# Patient Record
Sex: Female | Born: 1945
Health system: Southern US, Community
[De-identification: ages and names within clinical notes are randomized; demographics above are authoritative.]

## PROBLEM LIST (undated history)

## (undated) DIAGNOSIS — G43909 Migraine, unspecified, not intractable, without status migrainosus: Secondary | ICD-10-CM

## (undated) DIAGNOSIS — C73 Malignant neoplasm of thyroid gland: Secondary | ICD-10-CM

## (undated) DIAGNOSIS — J189 Pneumonia, unspecified organism: Secondary | ICD-10-CM

## (undated) DIAGNOSIS — T4145XA Adverse effect of unspecified anesthetic, initial encounter: Secondary | ICD-10-CM

## (undated) DIAGNOSIS — N6019 Diffuse cystic mastopathy of unspecified breast: Secondary | ICD-10-CM

## (undated) DIAGNOSIS — I10 Essential (primary) hypertension: Secondary | ICD-10-CM

## (undated) DIAGNOSIS — E039 Hypothyroidism, unspecified: Secondary | ICD-10-CM

## (undated) DIAGNOSIS — F419 Anxiety disorder, unspecified: Secondary | ICD-10-CM

## (undated) DIAGNOSIS — I739 Peripheral vascular disease, unspecified: Secondary | ICD-10-CM

## (undated) DIAGNOSIS — C50919 Malignant neoplasm of unspecified site of unspecified female breast: Secondary | ICD-10-CM

## (undated) DIAGNOSIS — K219 Gastro-esophageal reflux disease without esophagitis: Secondary | ICD-10-CM

## (undated) DIAGNOSIS — T8859XA Other complications of anesthesia, initial encounter: Secondary | ICD-10-CM

## (undated) DIAGNOSIS — C801 Malignant (primary) neoplasm, unspecified: Secondary | ICD-10-CM

## (undated) DIAGNOSIS — Z87440 Personal history of urinary (tract) infections: Secondary | ICD-10-CM

## (undated) DIAGNOSIS — E042 Nontoxic multinodular goiter: Secondary | ICD-10-CM

## (undated) DIAGNOSIS — E785 Hyperlipidemia, unspecified: Secondary | ICD-10-CM

## (undated) DIAGNOSIS — C439 Malignant melanoma of skin, unspecified: Secondary | ICD-10-CM

## (undated) DIAGNOSIS — F329 Major depressive disorder, single episode, unspecified: Secondary | ICD-10-CM

## (undated) DIAGNOSIS — J302 Other seasonal allergic rhinitis: Secondary | ICD-10-CM

## (undated) HISTORY — DX: Major depressive disorder, single episode, unspecified: F32.9

## (undated) HISTORY — DX: Migraine, unspecified, not intractable, without status migrainosus: G43.909

## (undated) HISTORY — DX: Hyperlipidemia, unspecified: E78.5

## (undated) HISTORY — PX: ABDOMINAL HYSTERECTOMY: SHX81

## (undated) HISTORY — DX: Diffuse cystic mastopathy of unspecified breast: N60.19

## (undated) HISTORY — DX: Gastro-esophageal reflux disease without esophagitis: K21.9

## (undated) HISTORY — DX: Hypothyroidism, unspecified: E03.9

## (undated) HISTORY — PX: BREAST LUMPECTOMY: SHX2

## (undated) HISTORY — DX: Malignant melanoma of skin, unspecified: C43.9

## (undated) HISTORY — PX: EYE SURGERY: SHX253

## (undated) HISTORY — DX: Anxiety disorder, unspecified: F41.9

## (undated) HISTORY — DX: Malignant neoplasm of thyroid gland: C73

## (undated) HISTORY — DX: Nontoxic multinodular goiter: E04.2

## (undated) HISTORY — PX: WRIST FRACTURE SURGERY: SHX121

---

## 1989-09-07 HISTORY — PX: THYROIDECTOMY: SHX17

## 1999-11-25 ENCOUNTER — Encounter: Payer: Self-pay | Admitting: Internal Medicine

## 1999-11-25 ENCOUNTER — Encounter: Admission: RE | Admit: 1999-11-25 | Discharge: 1999-11-25 | Payer: Self-pay | Admitting: Internal Medicine

## 2000-07-15 ENCOUNTER — Encounter: Admission: RE | Admit: 2000-07-15 | Discharge: 2000-07-15 | Payer: Self-pay | Admitting: Internal Medicine

## 2000-07-15 ENCOUNTER — Encounter: Payer: Self-pay | Admitting: Internal Medicine

## 2001-06-28 ENCOUNTER — Encounter: Admission: RE | Admit: 2001-06-28 | Discharge: 2001-06-28 | Payer: Self-pay | Admitting: Internal Medicine

## 2001-06-28 ENCOUNTER — Encounter: Payer: Self-pay | Admitting: Internal Medicine

## 2001-07-19 ENCOUNTER — Encounter: Admission: RE | Admit: 2001-07-19 | Discharge: 2001-07-19 | Payer: Self-pay | Admitting: Internal Medicine

## 2001-07-19 ENCOUNTER — Encounter: Payer: Self-pay | Admitting: Internal Medicine

## 2002-10-09 ENCOUNTER — Encounter: Admission: RE | Admit: 2002-10-09 | Discharge: 2002-10-09 | Payer: Self-pay | Admitting: Internal Medicine

## 2002-10-09 ENCOUNTER — Encounter: Payer: Self-pay | Admitting: Internal Medicine

## 2004-02-19 ENCOUNTER — Other Ambulatory Visit: Admission: RE | Admit: 2004-02-19 | Discharge: 2004-02-19 | Payer: Self-pay | Admitting: Internal Medicine

## 2004-04-15 ENCOUNTER — Other Ambulatory Visit: Admission: RE | Admit: 2004-04-15 | Discharge: 2004-04-15 | Payer: Self-pay | Admitting: Internal Medicine

## 2004-04-22 ENCOUNTER — Encounter: Admission: RE | Admit: 2004-04-22 | Discharge: 2004-04-22 | Payer: Self-pay | Admitting: Internal Medicine

## 2005-03-24 ENCOUNTER — Encounter: Admission: RE | Admit: 2005-03-24 | Discharge: 2005-03-24 | Payer: Self-pay | Admitting: Internal Medicine

## 2005-06-30 ENCOUNTER — Other Ambulatory Visit: Admission: RE | Admit: 2005-06-30 | Discharge: 2005-06-30 | Payer: Self-pay | Admitting: Obstetrics and Gynecology

## 2005-12-08 ENCOUNTER — Other Ambulatory Visit: Admission: RE | Admit: 2005-12-08 | Discharge: 2005-12-08 | Payer: Self-pay | Admitting: Obstetrics and Gynecology

## 2006-09-21 ENCOUNTER — Encounter: Admission: RE | Admit: 2006-09-21 | Discharge: 2006-09-21 | Payer: Self-pay | Admitting: Internal Medicine

## 2006-12-28 ENCOUNTER — Other Ambulatory Visit: Admission: RE | Admit: 2006-12-28 | Discharge: 2006-12-28 | Payer: Self-pay | Admitting: Obstetrics and Gynecology

## 2007-08-19 ENCOUNTER — Encounter: Admission: RE | Admit: 2007-08-19 | Discharge: 2007-08-19 | Payer: Self-pay | Admitting: Internal Medicine

## 2008-03-28 ENCOUNTER — Encounter: Admission: RE | Admit: 2008-03-28 | Discharge: 2008-03-28 | Payer: Self-pay | Admitting: Internal Medicine

## 2008-04-05 ENCOUNTER — Encounter: Admission: RE | Admit: 2008-04-05 | Discharge: 2008-04-05 | Payer: Self-pay | Admitting: Internal Medicine

## 2009-10-29 ENCOUNTER — Encounter: Admission: RE | Admit: 2009-10-29 | Discharge: 2009-10-29 | Payer: Self-pay | Admitting: Internal Medicine

## 2009-11-05 ENCOUNTER — Encounter: Admission: RE | Admit: 2009-11-05 | Discharge: 2009-11-05 | Payer: Self-pay | Admitting: Internal Medicine

## 2009-12-06 HISTORY — PX: BREAST LUMPECTOMY: SHX2

## 2009-12-17 ENCOUNTER — Ambulatory Visit (HOSPITAL_BASED_OUTPATIENT_CLINIC_OR_DEPARTMENT_OTHER): Admission: RE | Admit: 2009-12-17 | Discharge: 2009-12-17 | Payer: Self-pay | Admitting: Surgery

## 2009-12-17 ENCOUNTER — Encounter: Admission: RE | Admit: 2009-12-17 | Discharge: 2009-12-17 | Payer: Self-pay | Admitting: Surgery

## 2010-03-05 ENCOUNTER — Emergency Department (HOSPITAL_COMMUNITY): Admission: EM | Admit: 2010-03-05 | Discharge: 2010-03-05 | Payer: Self-pay | Admitting: Emergency Medicine

## 2010-04-03 ENCOUNTER — Encounter: Admission: RE | Admit: 2010-04-03 | Discharge: 2010-04-03 | Payer: Self-pay | Admitting: Surgery

## 2010-10-23 ENCOUNTER — Other Ambulatory Visit: Payer: Self-pay | Admitting: Internal Medicine

## 2010-10-23 DIAGNOSIS — N644 Mastodynia: Secondary | ICD-10-CM

## 2010-11-19 ENCOUNTER — Ambulatory Visit
Admission: RE | Admit: 2010-11-19 | Discharge: 2010-11-19 | Disposition: A | Discharge status: Home / Self Care | Payer: Medicare Other | Source: Ambulatory Visit | Attending: Internal Medicine | Admitting: Internal Medicine

## 2010-11-19 ENCOUNTER — Other Ambulatory Visit: Payer: Self-pay | Admitting: Internal Medicine

## 2010-11-19 DIAGNOSIS — N644 Mastodynia: Secondary | ICD-10-CM

## 2010-11-23 LAB — URINALYSIS, ROUTINE W REFLEX MICROSCOPIC
Bilirubin Urine: NEGATIVE
Specific Gravity, Urine: 1.009 (ref 1.005–1.030)
Urobilinogen, UA: 1 mg/dL (ref 0.0–1.0)

## 2010-11-23 LAB — BASIC METABOLIC PANEL
BUN: 7 mg/dL (ref 6–23)
Chloride: 105 mEq/L (ref 96–112)
Creatinine, Ser: 0.72 mg/dL (ref 0.4–1.2)
GFR calc Af Amer: >60 mL/min (ref >60)
GFR calc non Af Amer: >60 mL/min (ref >60)
Glucose, Bld: 109 mg/dL — ABNORMAL HIGH (ref 70–99)

## 2010-11-23 LAB — CBC
MCH: 30.1 pg (ref 26.0–34.0)
MCHC: 34.2 g/dL (ref 30.0–36.0)
MCV: 88.2 fL (ref 78.0–100.0)
Platelets: 222 10*3/uL (ref 150–400)
RDW: 13.6 % (ref 11.5–15.5)

## 2010-11-23 LAB — DIFFERENTIAL
Basophils Relative: 1 % (ref 0–1)
Eosinophils Relative: 2 % (ref 0–5)
Lymphocytes Relative: 34 % (ref 12–46)
Monocytes Absolute: 1 10*3/uL (ref 0.1–1.0)
Monocytes Relative: 16 % — ABNORMAL HIGH (ref 3–12)

## 2010-11-23 LAB — URINE MICROSCOPIC-ADD ON

## 2010-11-23 LAB — POCT CARDIAC MARKERS
CKMB, poc: <1 ng/mL — ABNORMAL LOW (ref 1.0–8.0)
Myoglobin, poc: 80.4 ng/mL (ref 12–200)
Troponin i, poc: <0.05 ng/mL (ref 0.00–0.09)

## 2010-11-26 LAB — POCT I-STAT, CHEM 8
BUN: 14 mg/dL (ref 6–23)
Calcium, Ion: 1.1 mmol/L — ABNORMAL LOW (ref 1.12–1.32)
Chloride: 108 mEq/L (ref 96–112)
Creatinine, Ser: 0.6 mg/dL (ref 0.4–1.2)
Glucose, Bld: 92 mg/dL (ref 70–99)
HCT: 38 % (ref 36.0–46.0)
Potassium: 4.5 mEq/L (ref 3.5–5.1)
Sodium: 139 mEq/L (ref 135–145)

## 2011-10-28 ENCOUNTER — Other Ambulatory Visit: Payer: Self-pay | Admitting: Internal Medicine

## 2011-10-28 DIAGNOSIS — R921 Mammographic calcification found on diagnostic imaging of breast: Secondary | ICD-10-CM

## 2011-11-20 ENCOUNTER — Ambulatory Visit
Admission: RE | Admit: 2011-11-20 | Discharge: 2011-11-20 | Disposition: A | Discharge status: Home / Self Care | Payer: Medicare Other | Source: Ambulatory Visit | Attending: Internal Medicine | Admitting: Internal Medicine

## 2011-11-20 DIAGNOSIS — R921 Mammographic calcification found on diagnostic imaging of breast: Secondary | ICD-10-CM

## 2012-03-22 ENCOUNTER — Encounter (INDEPENDENT_AMBULATORY_CARE_PROVIDER_SITE_OTHER): Payer: Self-pay | Admitting: General Surgery

## 2012-04-15 ENCOUNTER — Encounter (INDEPENDENT_AMBULATORY_CARE_PROVIDER_SITE_OTHER): Payer: Self-pay | Admitting: Surgery

## 2012-04-15 ENCOUNTER — Ambulatory Visit (INDEPENDENT_AMBULATORY_CARE_PROVIDER_SITE_OTHER): Payer: Medicare Other | Admitting: Surgery

## 2012-04-15 VITALS — BP 112/68 | HR 80 | Temp 98.4°F | Resp 14 | Ht 62.0 in | Wt 171.0 lb

## 2012-04-15 DIAGNOSIS — N644 Mastodynia: Secondary | ICD-10-CM | POA: Insufficient documentation

## 2012-04-15 NOTE — Patient Instructions (Signed)
Try taking an anti-inflammatory such as Aleve or Motrin on a regular basis for a couple of weeks to see if this helps with your breast pain. Let me reexamine you in about 3 months.

## 2012-04-15 NOTE — Progress Notes (Signed)
NAME: Ariel Henderson DOB: 1946/07/12 MRN: [id number]                                                                                      DATE: 04/15/2012  PCP: Georgann Housekeeper, MD Referring Provider: Georgann Housekeeper, MD  IMPRESSION:  Breast pain with a history of fibrocystic changes  PLAN:   We'll try her on some anti-inflammatories and recheck her in about 3 months                 CC:  Chief Complaint  Patient presents with  . Breast Pain    HPI:  Ariel Henderson is a 66 y.o.  female who presents for evaluation of Left breast pain of about 3 months duration. I did a wire localized left breast biopsy of an abnormality in the lower inner quadrant of her breast in 2011. This is Fibrocystic changes with some sclerosing adenosis. It was in the lower inner quadrant almost at the inframammary fold.She had a normal mammogram several months ago. She has had the left nipple inverted as long as she can remember and has been stable with no change. There has been slight inversion of the right nipple again stable and no changes. The pain seems to be in the left lower inner quadrant. It has really gotten any better over the last couple of months.  She was worried and came in for evaluation.  PMH:  has a past medical history of GERD (gastroesophageal reflux disease); Hypothyroidism; Migraine headache; Chronic anxiety; Major depression; Dyslipidemia; Fibrocystic breast disease; and Multinodular goiter.  PSH:   has past surgical history that includes Wrist fracture surgery; Thyroidectomy (1991); and Breast lumpectomy (12/2009).  ALLERGIES:   Allergies  Allergen Reactions  . Aspirin Nausea Only    MEDICATIONS: Current outpatient prescriptions:almotriptan (AXERT) 12.5 MG tablet, Take 12.5 mg by mouth as needed. may repeat in 2 hours if needed, Disp: , Rfl: ;  diazepam (VALIUM) 10 MG tablet, Take 10 mg by mouth every 6 (six) hours as needed., Disp: , Rfl: ;  estrogens, conjugated, (PREMARIN) 0.625 MG  tablet, Take 0.625 mg by mouth daily. Take daily for 21 days then do not take for 7 days., Disp: , Rfl:  levothyroxine (SYNTHROID, LEVOTHROID) 137 MCG tablet, Take 137 mcg by mouth daily., Disp: , Rfl: ;  lovastatin (MEVACOR) 20 MG tablet, Take 20 mg by mouth at bedtime., Disp: , Rfl: ;  Omega-3 Fatty Acids (FISH OIL) 1000 MG CAPS, Take 1,000 mg by mouth daily at 6 (six) AM., Disp: , Rfl: ;  omeprazole (PRILOSEC) 40 MG capsule, Take 40 mg by mouth daily., Disp: , Rfl:  propranolol (INDERAL) 40 MG tablet, Take 40 mg by mouth 3 (three) times daily., Disp: , Rfl: ;  venlafaxine (EFFEXOR) 75 MG tablet, Take 75 mg by mouth 2 (two) times daily., Disp: , Rfl: ;  vitamin E 400 UNIT capsule, Take 400 Units by mouth daily., Disp: , Rfl:   ROS: She has filled out our 12 point review of systems and it is negative Except for headaches and a cough. EXAM:   Vital signs:BP 112/68  Pulse 80  Temp 98.4 F (  36.9 C) (Temporal)  Resp 14  Ht 5\' 2"  (1.575 m)  Wt 171 lb (77.565 kg)  BMI 31.28 kg/m2 Gen.: Patient alert oriented healthy-appearing Breasts: The right breast shows very minimal nipple inversion. There is no palpable mass it is not particularly tender. There is no skin lesions noted. The left breast shows marked nipple inversion and I cannot reverse it. There is loss of tissue lower Innerquadrant from her Prior biopsy. She is tender primarily between the scar from the incision and the areolar complex. The breast is somewhat irregular consistent with fibrocystic change, as is the right breast. There are no suspicious areas noted.  DATA REVIEWED:  I reviewed her mammogram report as well as the old pathology report from her surgical biopsy in 2011. She does have fibrocystic changes noted.    Becky Berberian J 04/15/2012  CC: Georgann Housekeeper, MD, Georgann Housekeeper, MD

## 2012-08-03 ENCOUNTER — Encounter (INDEPENDENT_AMBULATORY_CARE_PROVIDER_SITE_OTHER): Payer: Self-pay | Admitting: Surgery

## 2012-09-07 HISTORY — PX: MASTECTOMY: SHX3

## 2012-11-01 ENCOUNTER — Other Ambulatory Visit: Payer: Self-pay | Admitting: Internal Medicine

## 2012-11-21 ENCOUNTER — Ambulatory Visit
Admission: RE | Admit: 2012-11-21 | Discharge: 2012-11-21 | Disposition: A | Discharge status: Home / Self Care | Payer: Medicare Other | Source: Ambulatory Visit | Attending: Internal Medicine | Admitting: Internal Medicine

## 2012-11-21 ENCOUNTER — Other Ambulatory Visit: Payer: Self-pay | Admitting: Internal Medicine

## 2012-12-12 ENCOUNTER — Ambulatory Visit
Admission: RE | Admit: 2012-12-12 | Discharge: 2012-12-12 | Disposition: A | Discharge status: Home / Self Care | Payer: Medicare Other | Source: Ambulatory Visit | Attending: Internal Medicine | Admitting: Internal Medicine

## 2012-12-20 ENCOUNTER — Ambulatory Visit (INDEPENDENT_AMBULATORY_CARE_PROVIDER_SITE_OTHER): Payer: Medicare Other | Admitting: Surgery

## 2012-12-20 ENCOUNTER — Encounter (INDEPENDENT_AMBULATORY_CARE_PROVIDER_SITE_OTHER): Payer: Self-pay | Admitting: Surgery

## 2012-12-20 VITALS — BP 100/60 | HR 84 | Temp 98.2°F | Resp 18 | Ht 62.0 in | Wt 176.0 lb

## 2012-12-20 DIAGNOSIS — N6459 Other signs and symptoms in breast: Secondary | ICD-10-CM

## 2012-12-20 DIAGNOSIS — N6452 Nipple discharge: Secondary | ICD-10-CM

## 2012-12-20 NOTE — Progress Notes (Signed)
Ariel Henderson DOB: 1946-02-11 MRN: [id number]                                                                                      DATE: 12/20/2012  PCP: Georgann Housekeeper, MD Referring Provider: Georgann Housekeeper, MD  IMPRESSION:  Probable left intraductal papilloma in a patient with a history of extensive intraductal papillomatosis on biopsy in 1985  PLAN:   I reviewed the situation with the patient. She would like to go ahead schedule ductal excision surgical biopsy of this area.                 CC:  Chief Complaint  Patient presents with  . Breast Discharge    left nipple    HPI:  Ariel Henderson is a 67 y.o.  female who presents for evaluation of a left nipple discharge which has been present for a while and has been evaluated with a mammogram and ductogram. There is small filling defect noted on the ductogram and surgical evaluation was requested. She has a history of prior breast biopsies dating back to 42. The one in 1985 showed extensive intraductal papillomatosis was a subareolar biopsy. The pain that she is having her breasts and to better from  PMH:  has a past medical history of GERD (gastroesophageal reflux disease); Hypothyroidism; Migraine headache; Chronic anxiety; Major depression; Dyslipidemia; Fibrocystic breast disease; and Multinodular goiter. VITAL SIGNS:  BP 100/60  Pulse 84  Temp(Src) 98.2 F (36.8 C) (Temporal)  Resp 18  Ht 5\' 2"  (1.575 m)  Wt 176 lb (79.833 kg)  BMI 32.18 kg/m2  GENERAL:  The patient is alert, oriented, and generally healthy-appearing, NAD. Mood and affect are normal.  HEENT:  The head is normocephalic, the eyes nonicteric, the pupils were round regular and equal. EOMs are normal. Pharynx normal. Dentition good.  NECK:  The neck is supple and there are no masses or thyromegaly.  LUNGS: Normal respirations and clear to auscultation.  HEART: Regular rhythm, with no murmurs rubs or gallops. Pulses are intact carotid dorsalis  pedis and posterior tibial. No significant varicosities are noted.  BREASTS:  the breasts bilaterally show some nipple flattening and inversion. This is more on the left than the right. For multiple surgical scars all well healed. There is no palpable mass. I cannot elicit a discharge today.  LYMPHATICS: No axillary or supraclavicular adenopathy is identified  ABDOMEN: Soft, flat, and nontender. No masses or organomegaly is noted. No hernias are noted. Bowel sounds are normal.  EXTREMITIES:  Good range of motion, no edema.  PSH:   has past surgical history that includes Wrist fracture surgery; Thyroidectomy (1991); and Breast lumpectomy (12/2009).  ALLERGIES:   Allergies  Allergen Reactions  . Aspirin Nausea Only    MEDICATIONS: Current outpatient prescriptions:almotriptan (AXERT) 12.5 MG tablet, Take 12.5 mg by mouth as needed. may repeat in 2 hours if needed, Disp: , Rfl: ;  diazepam (VALIUM) 10 MG tablet, Take 10 mg by mouth every 6 (six) hours as needed., Disp: , Rfl: ;  estradiol (ESTRACE) 1 MG tablet, , Disp: , Rfl: ;  levothyroxine (SYNTHROID, LEVOTHROID) 137 MCG tablet, Take 137  mcg by mouth daily., Disp: , Rfl:  lovastatin (MEVACOR) 20 MG tablet, Take 20 mg by mouth at bedtime., Disp: , Rfl: ;  Omega-3 Fatty Acids (FISH OIL) 1000 MG CAPS, Take 1,000 mg by mouth daily at 6 (six) AM., Disp: , Rfl: ;  omeprazole (PRILOSEC) 40 MG capsule, Take 40 mg by mouth daily., Disp: , Rfl: ;  propranolol (INDERAL) 40 MG tablet, Take 40 mg by mouth 3 (three) times daily., Disp: , Rfl:  venlafaxine (EFFEXOR) 75 MG tablet, Take 75 mg by mouth 2 (two) times daily., Disp: , Rfl: ;  vitamin E 400 UNIT capsule, Take 400 Units by mouth daily., Disp: , Rfl:   ROS: She has filled out our 12 point review of systems and it is negative .  CC: Georgann Housekeeper, MD, Georgann Housekeeper, MD

## 2012-12-21 ENCOUNTER — Encounter (INDEPENDENT_AMBULATORY_CARE_PROVIDER_SITE_OTHER): Payer: Self-pay

## 2012-12-26 ENCOUNTER — Encounter (HOSPITAL_COMMUNITY): Payer: Self-pay | Admitting: Pharmacy Technician

## 2012-12-26 NOTE — Patient Instructions (Addendum)
20 Ariel Henderson  12/26/2012   Your procedure is scheduled on:  12/29/12 THURSDAY  Report to [facility] at   0515    AM.  Call this number if you have problems the morning of surgery: [phone]       Remember:   Do not eat food  Or drink :After Midnight. Wednesday NIGHT   Take these medicines the morning of surgery with A SIP OF WATER:  Levothyroxine, Prolisec, Zantac, Effexor, PROPRANOLOL                                                           VALIUM IF NEEDED   .  Contacts, dentures or partial plates can not be worn to surgery  Leave suitcase in the car. After surgery it may be brought to your room.  For patients admitted to the hospital, checkout time is 11:00 AM day of  discharge.             SPECIAL INSTRUCTIONS- SEE [place] PREPARING FOR SURGERY INSTRUCTION SHEET-     DO NOT WEAR JEWELRY, LOTIONS, POWDERS, OR PERFUMES.  WOMEN-- DO NOT SHAVE LEGS OR UNDERARMS FOR 12 HOURS BEFORE SHOWERS. MEN MAY SHAVE FACE.  Patients discharged the day of surgery will not be allowed to drive home. IF going home the day of surgery, you must have a driver and someone to stay with you for the first 24 hours  Name and phone number of your driver:  Renae Fickle-  husband                                                                      Please read over the following fact sheets that you were given: MRSA Information, Incentive Spirometry Sheet, Blood Transfusion Sheet  Information                                                                                   Da Michelle  PST 336  [id number]                 FAILURE TO FOLLOW THESE INSTRUCTIONS MAY RESULT IN  CANCELLATION   OF YOUR SURGERY                                                  Patient Signature _____________________________

## 2012-12-27 ENCOUNTER — Encounter (HOSPITAL_COMMUNITY)
Admission: RE | Admit: 2012-12-27 | Discharge: 2012-12-27 | Disposition: A | Discharge status: Home / Self Care | Payer: Medicare Other | Source: Ambulatory Visit | Attending: Surgery | Admitting: Surgery

## 2012-12-27 ENCOUNTER — Encounter (HOSPITAL_COMMUNITY): Payer: Self-pay

## 2012-12-27 ENCOUNTER — Ambulatory Visit (HOSPITAL_COMMUNITY)
Admission: RE | Admit: 2012-12-27 | Discharge: 2012-12-27 | Disposition: A | Discharge status: Home / Self Care | Payer: Medicare Other | Source: Ambulatory Visit | Attending: Surgery | Admitting: Surgery

## 2012-12-27 DIAGNOSIS — Z01812 Encounter for preprocedural laboratory examination: Secondary | ICD-10-CM | POA: Insufficient documentation

## 2012-12-27 DIAGNOSIS — N63 Unspecified lump in unspecified breast: Secondary | ICD-10-CM | POA: Insufficient documentation

## 2012-12-27 DIAGNOSIS — K219 Gastro-esophageal reflux disease without esophagitis: Secondary | ICD-10-CM | POA: Insufficient documentation

## 2012-12-27 DIAGNOSIS — Z01810 Encounter for preprocedural cardiovascular examination: Secondary | ICD-10-CM | POA: Insufficient documentation

## 2012-12-27 DIAGNOSIS — Z01818 Encounter for other preprocedural examination: Secondary | ICD-10-CM | POA: Insufficient documentation

## 2012-12-27 DIAGNOSIS — N6459 Other signs and symptoms in breast: Secondary | ICD-10-CM | POA: Insufficient documentation

## 2012-12-27 DIAGNOSIS — I739 Peripheral vascular disease, unspecified: Secondary | ICD-10-CM | POA: Insufficient documentation

## 2012-12-27 DIAGNOSIS — I10 Essential (primary) hypertension: Secondary | ICD-10-CM | POA: Insufficient documentation

## 2012-12-27 DIAGNOSIS — Q791 Other congenital malformations of diaphragm: Secondary | ICD-10-CM | POA: Insufficient documentation

## 2012-12-27 HISTORY — DX: Peripheral vascular disease, unspecified: I73.9

## 2012-12-27 HISTORY — DX: Adverse effect of unspecified anesthetic, initial encounter: T41.45XA

## 2012-12-27 HISTORY — DX: Essential (primary) hypertension: I10

## 2012-12-27 HISTORY — DX: Malignant (primary) neoplasm, unspecified: C80.1

## 2012-12-27 HISTORY — DX: Other seasonal allergic rhinitis: J30.2

## 2012-12-27 HISTORY — DX: Other complications of anesthesia, initial encounter: T88.59XA

## 2012-12-27 LAB — CBC
MCH: 28.2 pg (ref 26.0–34.0)
MCHC: 33.1 g/dL (ref 30.0–36.0)
MCV: 85.3 fL (ref 78.0–100.0)
Platelets: 342 10*3/uL (ref 150–400)
RBC: 4.36 MIL/uL (ref 3.87–5.11)
RDW: 12.8 % (ref 11.5–15.5)

## 2012-12-27 LAB — BASIC METABOLIC PANEL
CO2: 26 mEq/L (ref 19–32)
Calcium: 9.5 mg/dL (ref 8.4–10.5)
Creatinine, Ser: 0.61 mg/dL (ref 0.50–1.10)
GFR calc non Af Amer: >90 mL/min (ref >90)
Sodium: 139 mEq/L (ref 135–145)

## 2012-12-27 LAB — SURGICAL PCR SCREEN: MRSA, PCR: NEGATIVE

## 2012-12-27 NOTE — Progress Notes (Signed)
LOV Dr Donette Larry 2/14 chart

## 2012-12-27 NOTE — Progress Notes (Signed)
Per [facility], Dr Jamey Ripa has reviewed CBC report

## 2012-12-28 ENCOUNTER — Telehealth (INDEPENDENT_AMBULATORY_CARE_PROVIDER_SITE_OTHER): Payer: Self-pay

## 2012-12-28 NOTE — Telephone Encounter (Signed)
Pt called stating she was out working in garden today and has noticed some laryngitis. No fever or cold symptoms. Pt states she thinks it may be the pollen. Pt advised we do not treat seasonal allergies. Pt concerned because she is having surgery tomorrow and does not want to delay surgery.  I advised pt to stay in house away from pollen remainder of the day. Pt to  push fluids until midnight tonight. She can take what her PCP recommends for allergy symptoms. Pt states she understands.

## 2012-12-29 ENCOUNTER — Encounter (HOSPITAL_COMMUNITY): Payer: Self-pay | Admitting: *Deleted

## 2012-12-29 ENCOUNTER — Ambulatory Visit (HOSPITAL_COMMUNITY): Payer: Medicare Other | Admitting: Anesthesiology

## 2012-12-29 ENCOUNTER — Encounter (HOSPITAL_COMMUNITY)
Admission: RE | Disposition: A | Discharge status: Home / Self Care | Payer: Self-pay | Source: Ambulatory Visit | Attending: Surgery

## 2012-12-29 ENCOUNTER — Ambulatory Visit (HOSPITAL_COMMUNITY)
Admission: RE | Admit: 2012-12-29 | Discharge: 2012-12-29 | Disposition: A | Discharge status: Home / Self Care | Payer: Medicare Other | Source: Ambulatory Visit | Attending: Surgery | Admitting: Surgery

## 2012-12-29 ENCOUNTER — Encounter (HOSPITAL_COMMUNITY): Payer: Self-pay | Admitting: Anesthesiology

## 2012-12-29 DIAGNOSIS — I739 Peripheral vascular disease, unspecified: Secondary | ICD-10-CM | POA: Insufficient documentation

## 2012-12-29 DIAGNOSIS — D0590 Unspecified type of carcinoma in situ of unspecified breast: Secondary | ICD-10-CM

## 2012-12-29 DIAGNOSIS — F411 Generalized anxiety disorder: Secondary | ICD-10-CM | POA: Insufficient documentation

## 2012-12-29 DIAGNOSIS — N6452 Nipple discharge: Secondary | ICD-10-CM

## 2012-12-29 DIAGNOSIS — F3289 Other specified depressive episodes: Secondary | ICD-10-CM | POA: Insufficient documentation

## 2012-12-29 DIAGNOSIS — I10 Essential (primary) hypertension: Secondary | ICD-10-CM | POA: Insufficient documentation

## 2012-12-29 DIAGNOSIS — E669 Obesity, unspecified: Secondary | ICD-10-CM | POA: Insufficient documentation

## 2012-12-29 DIAGNOSIS — K219 Gastro-esophageal reflux disease without esophagitis: Secondary | ICD-10-CM | POA: Insufficient documentation

## 2012-12-29 DIAGNOSIS — F329 Major depressive disorder, single episode, unspecified: Secondary | ICD-10-CM | POA: Insufficient documentation

## 2012-12-29 DIAGNOSIS — N6459 Other signs and symptoms in breast: Secondary | ICD-10-CM | POA: Insufficient documentation

## 2012-12-29 DIAGNOSIS — E039 Hypothyroidism, unspecified: Secondary | ICD-10-CM | POA: Insufficient documentation

## 2012-12-29 HISTORY — PX: BREAST DUCTAL SYSTEM EXCISION: SHX5242

## 2012-12-29 SURGERY — EXCISION DUCTAL SYSTEM BREAST
Anesthesia: General | Site: Breast | Laterality: Left | Wound class: Clean

## 2012-12-29 MED ORDER — LIDOCAINE HCL 1 % IJ SOLN
INTRAMUSCULAR | Status: AC
  Filled 2012-12-29: qty 20

## 2012-12-29 MED ORDER — 0.9 % SODIUM CHLORIDE (POUR BTL) OPTIME
TOPICAL | Status: DC | PRN
  Administered 2012-12-29: 1000 mL

## 2012-12-29 MED ORDER — OXYCODONE HCL 5 MG/5ML PO SOLN
5.0000 mg | Freq: Once | ORAL | Status: DC | PRN
  Filled 2012-12-29: qty 5

## 2012-12-29 MED ORDER — PROPOFOL 10 MG/ML IV BOLUS
INTRAVENOUS | Status: DC | PRN
  Administered 2012-12-29: 150 mg via INTRAVENOUS

## 2012-12-29 MED ORDER — BUPIVACAINE HCL (PF) 0.25 % IJ SOLN
INTRAMUSCULAR | Status: AC
  Filled 2012-12-29: qty 30

## 2012-12-29 MED ORDER — METHYLENE BLUE 1 % INJ SOLN
INTRAMUSCULAR | Status: AC
  Filled 2012-12-29: qty 10

## 2012-12-29 MED ORDER — LACTATED RINGERS IV SOLN: INTRAVENOUS | Status: DC

## 2012-12-29 MED ORDER — MEPERIDINE HCL 50 MG/ML IJ SOLN: 6.2500 mg | INTRAMUSCULAR | Status: DC | PRN

## 2012-12-29 MED ORDER — FENTANYL CITRATE 0.05 MG/ML IJ SOLN
INTRAMUSCULAR | Status: DC | PRN
  Administered 2012-12-29: 25 ug via INTRAVENOUS

## 2012-12-29 MED ORDER — ACETAMINOPHEN 10 MG/ML IV SOLN: 1000.0000 mg | Freq: Once | INTRAVENOUS | Status: DC | PRN

## 2012-12-29 MED ORDER — HYDROCODONE-ACETAMINOPHEN 5-325 MG PO TABS: 1.0000 | ORAL_TABLET | ORAL | Status: DC | PRN

## 2012-12-29 MED ORDER — MIDAZOLAM HCL 5 MG/5ML IJ SOLN
INTRAMUSCULAR | Status: DC | PRN
  Administered 2012-12-29: 2 mg via INTRAVENOUS

## 2012-12-29 MED ORDER — METHYLENE BLUE 1 % INJ SOLN
INTRAMUSCULAR | Status: DC | PRN
  Administered 2012-12-29: .1 mL via SUBMUCOSAL

## 2012-12-29 MED ORDER — BUPIVACAINE-EPINEPHRINE 0.25% -1:200000 IJ SOLN
INTRAMUSCULAR | Status: AC
  Filled 2012-12-29: qty 1

## 2012-12-29 MED ORDER — HYDROMORPHONE HCL PF 1 MG/ML IJ SOLN: 0.2500 mg | INTRAMUSCULAR | Status: DC | PRN

## 2012-12-29 MED ORDER — PROMETHAZINE HCL 25 MG/ML IJ SOLN: 6.2500 mg | INTRAMUSCULAR | Status: DC | PRN

## 2012-12-29 MED ORDER — BUPIVACAINE HCL (PF) 0.25 % IJ SOLN
INTRAMUSCULAR | Status: DC | PRN
  Administered 2012-12-29: 30 mL

## 2012-12-29 MED ORDER — CEFAZOLIN SODIUM-DEXTROSE 2-3 GM-% IV SOLR
2.0000 g | INTRAVENOUS | Status: AC
  Administered 2012-12-29: 2 g via INTRAVENOUS

## 2012-12-29 MED ORDER — LACTATED RINGERS IV SOLN
INTRAVENOUS | Status: DC | PRN
  Administered 2012-12-29: 07:00:00 via INTRAVENOUS

## 2012-12-29 MED ORDER — OXYCODONE HCL 5 MG PO TABS: 5.0000 mg | ORAL_TABLET | Freq: Once | ORAL | Status: DC | PRN

## 2012-12-29 MED ORDER — LIDOCAINE HCL (CARDIAC) 20 MG/ML IV SOLN
INTRAVENOUS | Status: DC | PRN
  Administered 2012-12-29: 50 mg via INTRAVENOUS

## 2012-12-29 MED ORDER — CEFAZOLIN SODIUM-DEXTROSE 2-3 GM-% IV SOLR
INTRAVENOUS | Status: AC
  Filled 2012-12-29: qty 50

## 2012-12-29 SURGICAL SUPPLY — 38 items
ADH SKN CLS APL DERMABOND .7 (GAUZE/BANDAGES/DRESSINGS) ×1
BANDAGE ELASTIC 6 VELCRO ST LF (GAUZE/BANDAGES/DRESSINGS) ×2 IMPLANT
BINDER BREAST LRG (GAUZE/BANDAGES/DRESSINGS) ×1 IMPLANT
BLADE SURG 15 STRL LF DISP TIS (BLADE) ×1 IMPLANT
BLADE SURG 15 STRL SS (BLADE) ×2
CANISTER SUCTION 2500CC (MISCELLANEOUS) ×2 IMPLANT
CHLORAPREP W/TINT 26ML (MISCELLANEOUS) ×2 IMPLANT
CLEANER TIP ELECTROSURG 2X2 (MISCELLANEOUS) ×2 IMPLANT
CLOTH BEACON ORANGE TIMEOUT ST (SAFETY) ×2 IMPLANT
DECANTER SPIKE VIAL GLASS SM (MISCELLANEOUS) ×3 IMPLANT
DERMABOND ADVANCED (GAUZE/BANDAGES/DRESSINGS) ×1
DERMABOND ADVANCED .7 DNX12 (GAUZE/BANDAGES/DRESSINGS) ×1 IMPLANT
DRAPE LAPAROTOMY TRNSV 102X78 (DRAPE) ×2 IMPLANT
ELECT COATED BLADE 2.86 ST (ELECTRODE) ×2 IMPLANT
ELECT REM PT RETURN 9FT ADLT (ELECTROSURGICAL) ×2
ELECTRODE REM PT RTRN 9FT ADLT (ELECTROSURGICAL) ×1 IMPLANT
GAUZE SPONGE 4X4 16PLY XRAY LF (GAUZE/BANDAGES/DRESSINGS) ×2 IMPLANT
GLOVE BIOGEL PI IND STRL 7.0 (GLOVE) ×1 IMPLANT
GLOVE BIOGEL PI INDICATOR 7.0 (GLOVE) ×1
GLOVE EUDERMIC 7 POWDERFREE (GLOVE) ×2 IMPLANT
GOWN STRL NON-REIN LRG LVL3 (GOWN DISPOSABLE) ×2 IMPLANT
GOWN STRL REIN XL XLG (GOWN DISPOSABLE) ×4 IMPLANT
KIT BASIN OR (CUSTOM PROCEDURE TRAY) ×2 IMPLANT
MARKER SKIN DUAL TIP RULER LAB (MISCELLANEOUS) ×2 IMPLANT
NDL HYPO 25X1 1.5 SAFETY (NEEDLE) ×1 IMPLANT
NEEDLE HYPO 22GX1.5 SAFETY (NEEDLE) ×3 IMPLANT
NEEDLE HYPO 25X1 1.5 SAFETY (NEEDLE) IMPLANT
PACK BASIC VI WITH GOWN DISP (CUSTOM PROCEDURE TRAY) ×2 IMPLANT
PENCIL BUTTON HOLSTER BLD 10FT (ELECTRODE) ×2 IMPLANT
SPONGE GAUZE 4X4 12PLY (GAUZE/BANDAGES/DRESSINGS) ×2 IMPLANT
SPONGE LAP 4X18 X RAY DECT (DISPOSABLE) ×2 IMPLANT
SUT MNCRL AB 4-0 PS2 18 (SUTURE) ×1 IMPLANT
SUT VIC AB 3-0 SH 8-18 (SUTURE) ×2 IMPLANT
SYR 3ML LL SCALE MARK (SYRINGE) ×1 IMPLANT
SYR BULB IRRIGATION 50ML (SYRINGE) ×2 IMPLANT
SYR CONTROL 10ML LL (SYRINGE) ×2 IMPLANT
TOWEL OR 17X26 10 PK STRL BLUE (TOWEL DISPOSABLE) ×2 IMPLANT
YANKAUER SUCT BULB TIP 10FT TU (MISCELLANEOUS) ×2 IMPLANT

## 2012-12-29 NOTE — Progress Notes (Signed)
Dr. Renold Don aware of patient's EKG readings- made aware of blood pressures in PACU

## 2012-12-29 NOTE — Transfer of Care (Signed)
Immediate Anesthesia Transfer of Care Note  Patient: Ariel Henderson  Procedure(s) Performed: Procedure(s) (LRB): EXCISION DUCTAL SYSTEM BREAST (Left)  Patient Location: PACU  Anesthesia Type: General  Level of Consciousness: sedated, patient cooperative and responds to stimulaton  Airway & Oxygen Therapy: Patient Spontanous Breathing and Patient connected to face mask oxgen  Post-op Assessment: Report given to PACU RN and Post -op Vital signs reviewed and stable  Post vital signs: Reviewed and stable  Complications: No apparent anesthesia complications

## 2012-12-29 NOTE — Anesthesia Postprocedure Evaluation (Signed)
Anesthesia Post Note  Patient: Ariel Henderson  Procedure(s) Performed: Procedure(s) (LRB): EXCISION DUCTAL SYSTEM BREAST (Left)  Anesthesia type: General  Patient location: PACU  Post pain: Pain level controlled  Post assessment: Post-op Vital signs reviewed  Last Vitals: BP 99/60  Pulse 74  Temp(Src) 36.6 C (Oral)  Resp 15  SpO2 100%  Post vital signs: Reviewed  Level of consciousness: sedated  Complications: No apparent anesthesia complications

## 2012-12-29 NOTE — Interval H&P Note (Signed)
History and Physical Interval Note:  12/29/2012 7:08 AM  Ariel Henderson  has presented today for surgery, with the diagnosis of left breast discharge  The various methods of treatment have been discussed with the patient and family. After consideration of risks, benefits and other options for treatment, the patient has consented to  Procedure(s): EXCISION DUCTAL SYSTEM BREAST (Left) as a surgical intervention .  The patient's history has been reviewed, patient examined, no change in status, stable for surgery.  I have reviewed the patient's chart and labs.  Questions were answered to the patient's satisfaction.   I have marked the left breast as the operative site  [name] J

## 2012-12-29 NOTE — Op Note (Signed)
Ariel Henderson 11/01/1945 [id number] 12/20/2012  Preoperative diagnosis: left breast nipple discharge likely papilloma  Postoperative diagnosis: same  Procedure: ductal excision, left breast  Surgeon: Currie Paris, MD, FACS  Anesthesia: Gen., LMA   Clinical History and Indications: this patient has presented with a small clear nipple discharge. A ductogram showed a duct going centrally and somewhat superiorly with a small filling defect. She had a previous biopsy many years ago that showed extensive papillomatosis. Excisional biopsy was recommended.    Description of Procedure: I saw the patient in preoperative. Confirmed and plans and marked the left breast as the operative side. The patient was then taken to the operating room and after satisfactory general anesthesia was obtained the left breast was prepped and draped and a time out was done.  I was able to cannulate the duct at the 12:00 position with a 2-0 tear duct probe, and then placed a small Angiocath and injected a small amount of methylene blue into that duct. I replaced the tear duct probe. It appeared to track in the direction of the abnormal duct as seen on the ductogram. I made a curvilinear incision if there were margin superiorly. I divided the subcutaneous tissue towards nipple and identify the duct with a tear duct probe and then divided. I then grasped the tissue around the tract of a tear duct probe and excised with cautery going several centimeters deep. I thought was well around all the abnormal tissue. This was sent to pathology.  I spent time irrigating and making sure everything was dry. I injected 30 cc of 0.25% plain Marcaine to help with postop pain relief. I put a pursestring around the base of the nipple to try to make it everted. I then closed the deeper breast tissue with interrupted 3-0 Vicryl, the skin with 4-0 Monocryl subcuticular and Dermabond.  The patient are the procedure well. There were no  operative complications. Counts were correct. Blood loss was minimal.  Currie Paris, MD, FACS 12/29/2012 8:22 AM

## 2012-12-29 NOTE — H&P (View-Only) (Signed)
NAME: Ariel Henderson DOB: 07/24/1946 MRN: [id number]                                                                                      DATE: 12/20/2012  PCP: HUSAIN,KARRAR, MD Referring Provider: Husain, Karrar, MD  IMPRESSION:  Probable left intraductal papilloma in a patient with a history of extensive intraductal papillomatosis on biopsy in 1985  PLAN:   I reviewed the situation with the patient. She would like to go ahead schedule ductal excision surgical biopsy of this area.                 CC:  Chief Complaint  Patient presents with  . Breast Discharge    left nipple    HPI:  Ariel Henderson is a 66 y.o.  female who presents for evaluation of a left nipple discharge which has been present for a while and has been evaluated with a mammogram and ductogram. There is small filling defect noted on the ductogram and surgical evaluation was requested. She has a history of prior breast biopsies dating back to 1985. The one in 1985 showed extensive intraductal papillomatosis was a subareolar biopsy. The pain that she is having her breasts and to better from  PMH:  has a past medical history of GERD (gastroesophageal reflux disease); Hypothyroidism; Migraine headache; Chronic anxiety; Major depression; Dyslipidemia; Fibrocystic breast disease; and Multinodular goiter. VITAL SIGNS:  BP 100/60  Pulse 84  Temp(Src) 98.2 F (36.8 C) (Temporal)  Resp 18  Ht 5' 2" (1.575 m)  Wt 176 lb (79.833 kg)  BMI 32.18 kg/m2  GENERAL:  The patient is alert, oriented, and generally healthy-appearing, NAD. Mood and affect are normal.  HEENT:  The head is normocephalic, the eyes nonicteric, the pupils were round regular and equal. EOMs are normal. Pharynx normal. Dentition good.  NECK:  The neck is supple and there are no masses or thyromegaly.  LUNGS: Normal respirations and clear to auscultation.  HEART: Regular rhythm, with no murmurs rubs or gallops. Pulses are intact carotid dorsalis  pedis and posterior tibial. No significant varicosities are noted.  BREASTS:  the breasts bilaterally show some nipple flattening and inversion. This is more on the left than the right. For multiple surgical scars all well healed. There is no palpable mass. I cannot elicit a discharge today.  LYMPHATICS: No axillary or supraclavicular adenopathy is identified  ABDOMEN: Soft, flat, and nontender. No masses or organomegaly is noted. No hernias are noted. Bowel sounds are normal.  EXTREMITIES:  Good range of motion, no edema.  PSH:   has past surgical history that includes Wrist fracture surgery; Thyroidectomy (1991); and Breast lumpectomy (12/2009).  ALLERGIES:   Allergies  Allergen Reactions  . Aspirin Nausea Only    MEDICATIONS: Current outpatient prescriptions:almotriptan (AXERT) 12.5 MG tablet, Take 12.5 mg by mouth as needed. may repeat in 2 hours if needed, Disp: , Rfl: ;  diazepam (VALIUM) 10 MG tablet, Take 10 mg by mouth every 6 (six) hours as needed., Disp: , Rfl: ;  estradiol (ESTRACE) 1 MG tablet, , Disp: , Rfl: ;  levothyroxine (SYNTHROID, LEVOTHROID) 137 MCG tablet, Take 137   mcg by mouth daily., Disp: , Rfl:  lovastatin (MEVACOR) 20 MG tablet, Take 20 mg by mouth at bedtime., Disp: , Rfl: ;  Omega-3 Fatty Acids (FISH OIL) 1000 MG CAPS, Take 1,000 mg by mouth daily at 6 (six) AM., Disp: , Rfl: ;  omeprazole (PRILOSEC) 40 MG capsule, Take 40 mg by mouth daily., Disp: , Rfl: ;  propranolol (INDERAL) 40 MG tablet, Take 40 mg by mouth 3 (three) times daily., Disp: , Rfl:  venlafaxine (EFFEXOR) 75 MG tablet, Take 75 mg by mouth 2 (two) times daily., Disp: , Rfl: ;  vitamin E 400 UNIT capsule, Take 400 Units by mouth daily., Disp: , Rfl:   ROS: She has filled out our 12 point review of systems and it is negative .  CC: Husain, Karrar, MD, HUSAIN,KARRAR, MD        

## 2012-12-29 NOTE — Anesthesia Preprocedure Evaluation (Addendum)
Anesthesia Evaluation  Patient identified by MRN, date of birth, ID band Patient awake    Reviewed: Allergy & Precautions, H&P , NPO status , Patient's Chart, lab work & pertinent test results, reviewed documented beta blocker date and time   History of Anesthesia Complications Negative for: history of anesthetic complications  Airway Mallampati: I TM Distance: >3 FB Neck ROM: Full    Dental  (+) Dental Advisory Given, Missing, Teeth Intact, Partial Lower and Partial Upper   Pulmonary neg pulmonary ROS,  breath sounds clear to auscultation        Cardiovascular hypertension, Pt. on medications and Pt. on home beta blockers + Peripheral Vascular Disease negative cardio ROS  Rhythm:Regular Rate:Normal     Neuro/Psych  Headaches, PSYCHIATRIC DISORDERS Anxiety Depression    GI/Hepatic negative GI ROS, Neg liver ROS, GERD-  Medicated,  Endo/Other  Hypothyroidism   Renal/GU negative Renal ROS     Musculoskeletal negative musculoskeletal ROS (+)   Abdominal (+) + obese,   Peds  Hematology negative hematology ROS (+)   Anesthesia Other Findings   Reproductive/Obstetrics negative OB ROS                          Anesthesia Physical Anesthesia Plan  ASA: II  Anesthesia Plan: General   Post-op Pain Management:    Induction: Intravenous  Airway Management Planned: LMA  Additional Equipment:   Intra-op Plan:   Post-operative Plan: Extubation in OR  Informed Consent: I have reviewed the patients History and Physical, chart, labs and discussed the procedure including the risks, benefits and alternatives for the proposed anesthesia with the patient or authorized representative who has indicated his/her understanding and acceptance.   Dental advisory given  Plan Discussed with: CRNA  Anesthesia Plan Comments:         Anesthesia Quick Evaluation

## 2012-12-30 ENCOUNTER — Telehealth (INDEPENDENT_AMBULATORY_CARE_PROVIDER_SITE_OTHER): Payer: Self-pay | Admitting: General Surgery

## 2012-12-30 ENCOUNTER — Encounter (HOSPITAL_COMMUNITY): Payer: Self-pay | Admitting: Surgery

## 2012-12-30 NOTE — Telephone Encounter (Signed)
The po f/u date is  01/10/13

## 2012-12-30 NOTE — Telephone Encounter (Signed)
Spoke with Renae Fickle patients spouse he is aware of appt  Po f/u on 01/11/13 240 pm

## 2013-01-02 ENCOUNTER — Telehealth (INDEPENDENT_AMBULATORY_CARE_PROVIDER_SITE_OTHER): Payer: Self-pay | Admitting: Surgery

## 2013-01-02 ENCOUNTER — Telehealth (INDEPENDENT_AMBULATORY_CARE_PROVIDER_SITE_OTHER): Payer: Self-pay | Admitting: General Surgery

## 2013-01-02 DIAGNOSIS — C50912 Malignant neoplasm of unspecified site of left female breast: Secondary | ICD-10-CM

## 2013-01-02 NOTE — Telephone Encounter (Signed)
Called her path today to her - extensive DCIS. Will have her see me later in the week, but will go ahead and schedule an MRI as well

## 2013-01-02 NOTE — Telephone Encounter (Signed)
Order placed for breast MR. Awaiting date and time of this to move patient's follow up appt. She is scheduled on 01/10/2013 right now.

## 2013-01-02 NOTE — Telephone Encounter (Signed)
MR is scheduled for 01/08/2013. Will keep appt for 01/10/2013 to have results of MR.

## 2013-01-02 NOTE — Telephone Encounter (Signed)
Message copied by Liliana Cline on Mon Jan 02, 2013 12:47 PM ------      Message from: Currie Paris      Created: Mon Jan 02, 2013 12:30 PM       Lesly Rubenstein - I called her path to her today. She will need an MRI and I should get her in the office to talk to her in more detail some time this week.             Thanks      ----- Message -----         From: [facility]         Sent: 01/02/2013   9:15 AM           To: Currie Paris, MD                   ------

## 2013-01-03 ENCOUNTER — Ambulatory Visit
Admission: RE | Admit: 2013-01-03 | Discharge: 2013-01-03 | Disposition: A | Discharge status: Home / Self Care | Payer: Medicare Other | Source: Ambulatory Visit | Attending: Surgery | Admitting: Surgery

## 2013-01-03 DIAGNOSIS — C50912 Malignant neoplasm of unspecified site of left female breast: Secondary | ICD-10-CM

## 2013-01-03 MED ORDER — GADOBENATE DIMEGLUMINE 529 MG/ML IV SOLN
15.0000 mL | Freq: Once | INTRAVENOUS | Status: AC | PRN
  Administered 2013-01-03: 15 mL via INTRAVENOUS

## 2013-01-08 ENCOUNTER — Other Ambulatory Visit: Payer: Medicare Other

## 2013-01-10 ENCOUNTER — Encounter (INDEPENDENT_AMBULATORY_CARE_PROVIDER_SITE_OTHER): Payer: Self-pay | Admitting: Surgery

## 2013-01-10 ENCOUNTER — Ambulatory Visit (INDEPENDENT_AMBULATORY_CARE_PROVIDER_SITE_OTHER): Payer: Medicare Other | Admitting: Surgery

## 2013-01-10 VITALS — BP 122/80 | HR 64 | Temp 96.7°F | Resp 14 | Ht 62.0 in | Wt 170.6 lb

## 2013-01-10 DIAGNOSIS — D0512 Intraductal carcinoma in situ of left breast: Secondary | ICD-10-CM | POA: Insufficient documentation

## 2013-01-10 DIAGNOSIS — C50912 Malignant neoplasm of unspecified site of left female breast: Secondary | ICD-10-CM

## 2013-01-10 DIAGNOSIS — C50919 Malignant neoplasm of unspecified site of unspecified female breast: Secondary | ICD-10-CM

## 2013-01-10 NOTE — Patient Instructions (Signed)
We will schedule a plastic surgery evaluation and make definitive surgery  scheduling plans

## 2013-01-10 NOTE — Progress Notes (Signed)
Ariel Henderson       DOB: [birth year]           DATE: 01/10/2013       BJY:[id number]  CC:  Chief Complaint  Patient presents with  . Routine Post Op    Lt br ductal exc   HPI: patient underwent left ductal excision for what appeared to be some filling defects in discharge. Unfortunately, the pathology report shows extensive DCIS with positive margins. She comes in today to discuss options for treatment and for postop followup. She's had a modest amount of pain but this is improving. She's had no other problems.   EXAM: Vital signs: BP 122/80  Pulse 64  Temp(Src) 96.7 F (35.9 C) (Temporal)  Resp 14  Ht 5\' 2"  (1.575 m)  Wt 170 lb 9.6 oz (77.384 kg)  BMI 31.2 kg/m2  SpO2 98%  General: Patient alert, oriented, NAD  An incision is healing nicely with no evidence of infection or hematoma. Slightly tender.  Data reviewed the MRI shows what appears to be residual disease at the lateral aspect of the biopsy cavity. Right breast is completely normal. The pathology report shows high-grade ductal carcinoma in situ with associated comedo necrosis extending to multiple foci of inked margins. Estrogen and progesterone receptors are both negative at 0% IMP: stage 0 centrally located left breast cancer, receptor negative  PLAN:  . I view of her alternatives with the patient.  She would like to have a total mastectomy.she is also interested in reconstruction. I think this is appropriate therapy in this patient as the disease is fairly extensive, she has relatively small breasts, and would require a central partial mastectomy as a lumpectomy. I also reviewed this with her husband. I have explained the pathophysiology and staging of breast cancer with particular attention to her exact situation. We discussed the multidisciplinary approach to breast cancer which often includes both medical and radiation oncology consultations.  We also discussed surgical options for the treatment of breast  cancer including lumpectomy and mastectomy with possible reconstructive surgery. In addition we talked about the evaluation and management of lymph nodes including a description of sentinel lymph node biopsy and axillary dissections. We reviewed potential complications and risks including bleeding, infection, numbness,  lymphedema, and the potential need for additional surgery.  She understands that for patients who are candidate for lumpectomy or mastectomy there is an equal survival rate with either technique, but a slightly higher local recurrence rate with lumpectomy. In addition she knows that a lumpectomy usually requires postoperative radiation as part of the management of the breast cancer.  We have discussed the likely postoperative course and plans for followup.  I have given the patient some written information that reviewed all of these issues. I believe her questions are answered and that she has a good understanding of the issues.   [name] 01/10/2013

## 2013-01-11 ENCOUNTER — Telehealth (INDEPENDENT_AMBULATORY_CARE_PROVIDER_SITE_OTHER): Payer: Self-pay | Admitting: General Surgery

## 2013-01-11 DIAGNOSIS — C50912 Malignant neoplasm of unspecified site of left female breast: Secondary | ICD-10-CM

## 2013-01-11 NOTE — Telephone Encounter (Signed)
Message copied by Liliana Cline on Wed Jan 11, 2013  8:44 AM ------      Message from: Currie Paris      Created: Wed Jan 11, 2013  8:08 AM       I forgot yesterday to talk to her about genetic counseling > she has a FH of breast cancer. Please put a consult in for that and let her know. ------

## 2013-01-11 NOTE — Telephone Encounter (Signed)
Patient made aware of need for genetic counseling. She is aware this is not needed prior to her surgery and they will be contacting her from [facility] re: an appt.

## 2013-01-12 ENCOUNTER — Telehealth: Payer: Self-pay | Admitting: *Deleted

## 2013-01-12 NOTE — Telephone Encounter (Signed)
Left message for pt to return my call so I can schedule a genetic appt.  

## 2013-01-13 ENCOUNTER — Other Ambulatory Visit (INDEPENDENT_AMBULATORY_CARE_PROVIDER_SITE_OTHER): Payer: Self-pay | Admitting: Surgery

## 2013-01-13 DIAGNOSIS — C50912 Malignant neoplasm of unspecified site of left female breast: Secondary | ICD-10-CM

## 2013-01-16 ENCOUNTER — Telehealth: Payer: Self-pay | Admitting: *Deleted

## 2013-01-16 NOTE — Telephone Encounter (Signed)
Confirmed 02/01/13 genetic appt w/ pt.  Emailed Jade at [facility] to make aware.

## 2013-01-31 ENCOUNTER — Encounter (HOSPITAL_COMMUNITY): Payer: Self-pay | Admitting: Pharmacy Technician

## 2013-02-01 ENCOUNTER — Other Ambulatory Visit: Payer: Medicare Other | Admitting: Lab

## 2013-02-01 ENCOUNTER — Ambulatory Visit (HOSPITAL_BASED_OUTPATIENT_CLINIC_OR_DEPARTMENT_OTHER): Payer: Medicare Other | Admitting: Genetic Counselor

## 2013-02-01 DIAGNOSIS — C50912 Malignant neoplasm of unspecified site of left female breast: Secondary | ICD-10-CM

## 2013-02-01 DIAGNOSIS — C50919 Malignant neoplasm of unspecified site of unspecified female breast: Secondary | ICD-10-CM

## 2013-02-01 DIAGNOSIS — Z8042 Family history of malignant neoplasm of prostate: Secondary | ICD-10-CM | POA: Insufficient documentation

## 2013-02-01 DIAGNOSIS — C73 Malignant neoplasm of thyroid gland: Secondary | ICD-10-CM

## 2013-02-01 DIAGNOSIS — Z800 Family history of malignant neoplasm of digestive organs: Secondary | ICD-10-CM | POA: Insufficient documentation

## 2013-02-01 DIAGNOSIS — Z8041 Family history of malignant neoplasm of ovary: Secondary | ICD-10-CM

## 2013-02-01 DIAGNOSIS — Z85850 Personal history of malignant neoplasm of thyroid: Secondary | ICD-10-CM | POA: Insufficient documentation

## 2013-02-01 DIAGNOSIS — IMO0002 Reserved for concepts with insufficient information to code with codable children: Secondary | ICD-10-CM

## 2013-02-01 DIAGNOSIS — C439 Malignant melanoma of skin, unspecified: Secondary | ICD-10-CM | POA: Insufficient documentation

## 2013-02-01 NOTE — Progress Notes (Signed)
Dr. Donette Larry requested a cancer genetics consultation for Sampson Si, a 67 y.o. female, due to a personal and family history of cancer.  Ms. Bazzle presents to clinic today to discuss the possibility of a hereditary predisposition to cancer, genetic testing, and to further clarify her future cancer risks, as well as potential cancer risk for family members.   HISTORY OF PRESENT ILLNESS: Ms. Binz was diagnosed with left breast cancer , high grade DCIS, at the age of 57. This was ER/PR negative. She reports being diagnosed with tyroid cancer in her 30s and is s/p thyroidectomy and radiation. She also reports a diagnosis of melanoma on her back in her 12s. This was surgically resected and she required no further treatment.   Past Medical History  Diagnosis Date   GERD (gastroesophageal reflux disease)    Migraine headache    Chronic anxiety    Major depression    Dyslipidemia    Fibrocystic breast disease    Complication of anesthesia     "slow to wake up"   Hypertension    Seasonal allergies    Peripheral vascular disease     varicose veins   Cancer     cervical per patient   Melanoma     per patient on 02/01/13; located on back; dx in 40s   Hypothyroidism    Multinodular goiter    Thyroid cancer     per patient on 02/01/2013; dx in 30s    Past Surgical History  Procedure Laterality Date   Wrist fracture surgery     Thyroidectomy  1991   Breast lumpectomy      left   x 3, right x 2   Abdominal hysterectomy     Eye surgery Bilateral     cataract extraction with IOL   Breast ductal system excision Left 12/29/2012    Procedure: EXCISION DUCTAL SYSTEM BREAST;  Surgeon: Currie Paris, MD;  Location: [facility];  Service: General;  Laterality: Left;    HORMONAL RISK FACTORS: Menarche was at age 10 First live birth at age 71  OCP use: less than a year.  Ovaries intact: yes Hysterectomy: no Menopausal status: postmenopausal HRT use: approximately 12  years Colonoscopy: yes; normal  History   Social History   Marital Status: Married    Spouse Name: N/A    Number of Children: N/A   Years of Education: N/A   Social History Main Topics   Smoking status: Never Smoker    Smokeless tobacco: Never Used   Alcohol Use: No   Drug Use: No   Sexually Active: None   Other Topics Concern   None   Social History Narrative   None     FAMILY HISTORY:  During the visit, a 4-generation pedigree was obtained. Significant diagnoses include the following:  Family History  Problem Relation Age of Onset   Colon cancer Brother 35   Prostate cancer Brother 40   Lung cancer Father 49    smoker   Ovarian cancer Sister 1   Brain cancer Sister 35   Prostate cancer Brother 31   Prostate cancer Brother 82   Prostate cancer Brother 31   Prostate cancer Brother 63   Cancer Brother 44    metastatic; unknown primary    Ms. Tuazon's ancestry is of Caucasian descent. There is no known Jewish ancestry or consanguinity.  GENETIC COUNSELING ASSESSMENT: Ms. Dunnigan is a 67 y.o. female with a personal and family history of cancer somewhat suggestive of  a hereditary predisposition to cancer. We, therefore, discussed and recommended the following at today's visit.   DISCUSSION: We reviewed the characteristics, features and inheritance patterns of hereditary cancer syndromes. We also discussed genetic testing, including the appropriate family members to test, the process of testing, insurance coverage and turn-around-time for results. We recommended Ms. Deakin pursue genetic testing for a comprehensive cancer gene panel given her personal history of cancer, the number of cancer diagnoses in the family and the varied types of cancers in the family.   PLAN: Ms. Capelle wished to pursue genetic testing and the blood sample will be sent to [facility] for analysis of the comprehensive cancer gene panel.  We discussed the implications  of a positive, negative and/ or variant of uncertain significance genetic test result. Results should be available within approximately 6 weeks time, at which point they will be disclosed by telephone to Ms. Tramel, as will any additional recommendations warranted by these results. Ms. Gradel will receive a summary of her genetic counseling visit and a copy of her results once available. This information will also be available in Epic. We encouraged Ms. Degroote to remain in contact with cancer genetics annually so that we can continuously update the family history and inform her of any changes in cancer genetics and testing that may be of benefit for this family. Ms.  Hole questions were answered to her satisfaction today. Our contact information was provided should additional questions or concerns arise. Thank you for the referral and allowing [place] to share in the care of your patient.   The patient was seen for a total of 45 minutes, greater than 50% of which was spent face-to-face counseling.  This patient was discussed with Dr. Drue Second and she agrees with the above.    _______________________________________________________________________ For Office Staff:  Number of people involved in session: 3 Was an Intern/ student involved with case: not applicable

## 2013-02-02 ENCOUNTER — Encounter (HOSPITAL_COMMUNITY)
Admission: RE | Admit: 2013-02-02 | Discharge: 2013-02-02 | Disposition: A | Discharge status: Home / Self Care | Payer: Medicare Other | Source: Ambulatory Visit | Attending: Surgery | Admitting: Surgery

## 2013-02-02 NOTE — Pre-Procedure Instructions (Signed)
Ariel Henderson  02/02/2013   Your procedure is scheduled on:  Thurs, June 5 @ 7:30 AM  Report to [facility] at 5:30 AM.  Call this number if you have problems the morning of surgery: [phone]   Remember:   Do not eat food or drink liquids after midnight.   Take these medicines the morning of surgery with A SIP OF WATER: Valium(Diazepam),Pain Pill(if needed),Levothyroxine(Synthroid),Omeprazole(Prilosec),Ranitidine(Zantac),Effexor(Venlafaxine),and Inderal(Propranolol)   Do not wear jewelry, make-up or nail polish.  Do not wear lotions, powders, or perfumes. You may wear deodorant.  Do not shave 48 hours prior to surgery.   Do not bring valuables to the hospital.  Contacts, dentures or bridgework may not be worn into surgery.  Leave suitcase in the car. After surgery it may be brought to your room.  For patients admitted to the hospital, checkout time is 11:00 AM the day of  discharge.   Patients discharged the day of surgery will not be allowed to drive  home.    Special Instructions: Shower using CHG 2 nights before surgery and the night before surgery.  If you shower the day of surgery use CHG.  Use special wash - you have one bottle of CHG for all showers.  You should use approximately 1/3 of the bottle for each shower.   Please read over the following fact sheets that you were given: Pain Booklet, Coughing and Deep Breathing, MRSA Information and Surgical Site Infection Prevention

## 2013-02-06 ENCOUNTER — Other Ambulatory Visit: Payer: Self-pay | Admitting: Plastic Surgery

## 2013-02-07 ENCOUNTER — Encounter (HOSPITAL_COMMUNITY): Payer: Self-pay

## 2013-02-07 ENCOUNTER — Ambulatory Visit (HOSPITAL_COMMUNITY)
Admission: RE | Admit: 2013-02-07 | Discharge: 2013-02-07 | Disposition: A | Discharge status: Home / Self Care | Payer: Medicare Other | Source: Ambulatory Visit | Attending: Surgery | Admitting: Surgery

## 2013-02-07 DIAGNOSIS — Z01818 Encounter for other preprocedural examination: Secondary | ICD-10-CM | POA: Insufficient documentation

## 2013-02-07 DIAGNOSIS — Z01812 Encounter for preprocedural laboratory examination: Secondary | ICD-10-CM | POA: Insufficient documentation

## 2013-02-07 HISTORY — DX: Personal history of urinary (tract) infections: Z87.440

## 2013-02-07 HISTORY — DX: Pneumonia, unspecified organism: J18.9

## 2013-02-07 LAB — CBC
Hemoglobin: 11.3 g/dL — ABNORMAL LOW (ref 12.0–15.0)
MCH: 27.6 pg (ref 26.0–34.0)
MCHC: 32.4 g/dL (ref 30.0–36.0)
Platelets: 317 10*3/uL (ref 150–400)
RBC: 4.1 MIL/uL (ref 3.87–5.11)

## 2013-02-07 LAB — BASIC METABOLIC PANEL
BUN: 15 mg/dL (ref 6–23)
Calcium: 9.3 mg/dL (ref 8.4–10.5)
GFR calc Af Amer: >90 mL/min (ref >90)
GFR calc non Af Amer: 89 mL/min — ABNORMAL LOW (ref >90)
Potassium: 5 mEq/L (ref 3.5–5.1)
Sodium: 139 mEq/L (ref 135–145)

## 2013-02-07 LAB — SURGICAL PCR SCREEN: MRSA, PCR: NEGATIVE

## 2013-02-07 NOTE — Pre-Procedure Instructions (Addendum)
Ariel Henderson  02/07/2013   Your procedure is scheduled on: 02/09/13  Report to [facility] at 530AM.  Call this number if you have problems the morning of surgery: [phone]   Remember:   Do not eat food or drink liquids after midnight.   Take these medicines the morning of surgery with A SIP OF WATER: pain med, estradiol, levothyroxine, propanerolol, zantac,effexor STOP aspirin, omega 3, vit e now   Do not wear jewelry, make-up or nail polish.  Do not wear lotions, powders, or perfumes. You may wear deodorant.  Do not shave 48 hours prior to surgery. Men may shave face and neck.  Do not bring valuables to the hospital.  [facility] is not responsible                   for any belongings or valuables.  Contacts, dentures or bridgework may not be worn into surgery.  Leave suitcase in the car. After surgery it may be brought to your room.  For patients admitted to the hospital, checkout time is 11:00 AM the day of  discharge.   Patients discharged the day of surgery will not be allowed to drive  home.  Name and phone number of your driver:   Special Instructions: Shower using CHG 2 nights before surgery and the night before surgery.  If you shower the day of surgery use CHG.  Use special wash - you have one bottle of CHG for all showers.  You should use approximately 1/3 of the bottle for each shower.   Please read over the following fact sheets that you were given: Pain Booklet, Coughing and Deep Breathing, MRSA Information and Surgical Site Infection Prevention

## 2013-02-08 MED ORDER — CHLORHEXIDINE GLUCONATE 4 % EX LIQD: 1.0000 "application " | Freq: Once | CUTANEOUS | Status: DC

## 2013-02-08 MED ORDER — HEPARIN SODIUM (PORCINE) 5000 UNIT/ML IJ SOLN
5000.0000 [IU] | Freq: Once | INTRAMUSCULAR | Status: AC
  Administered 2013-02-09: 5000 [IU] via SUBCUTANEOUS
  Filled 2013-02-08: qty 1

## 2013-02-08 MED ORDER — CEFAZOLIN SODIUM-DEXTROSE 2-3 GM-% IV SOLR
2.0000 g | INTRAVENOUS | Status: AC
  Administered 2013-02-09: 2 g via INTRAVENOUS
  Filled 2013-02-08: qty 50

## 2013-02-09 ENCOUNTER — Encounter (HOSPITAL_COMMUNITY)
Admission: RE | Admit: 2013-02-09 | Discharge: 2013-02-09 | Disposition: A | Discharge status: Home / Self Care | Payer: Medicare Other | Source: Ambulatory Visit | Attending: Surgery | Admitting: Surgery

## 2013-02-09 ENCOUNTER — Encounter (HOSPITAL_COMMUNITY): Payer: Self-pay | Admitting: *Deleted

## 2013-02-09 ENCOUNTER — Ambulatory Visit (HOSPITAL_COMMUNITY): Payer: Medicare Other | Admitting: Certified Registered Nurse Anesthetist

## 2013-02-09 ENCOUNTER — Inpatient Hospital Stay (HOSPITAL_COMMUNITY)
Admission: RE | Admit: 2013-02-09 | Discharge: 2013-02-11 | DRG: 581 | Disposition: A | Discharge status: Home / Self Care | Payer: Medicare Other | Source: Ambulatory Visit | Attending: Surgery | Admitting: Surgery

## 2013-02-09 ENCOUNTER — Encounter (HOSPITAL_COMMUNITY)
Admission: RE | Disposition: A | Discharge status: Home / Self Care | Payer: Self-pay | Source: Ambulatory Visit | Attending: Surgery

## 2013-02-09 ENCOUNTER — Encounter (HOSPITAL_COMMUNITY): Payer: Self-pay | Admitting: Certified Registered Nurse Anesthetist

## 2013-02-09 DIAGNOSIS — C50919 Malignant neoplasm of unspecified site of unspecified female breast: Secondary | ICD-10-CM | POA: Insufficient documentation

## 2013-02-09 DIAGNOSIS — C50912 Malignant neoplasm of unspecified site of left female breast: Secondary | ICD-10-CM

## 2013-02-09 DIAGNOSIS — D0590 Unspecified type of carcinoma in situ of unspecified breast: Secondary | ICD-10-CM

## 2013-02-09 HISTORY — PX: BREAST RECONSTRUCTION WITH PLACEMENT OF TISSUE EXPANDER AND FLEX HD (ACELLULAR HYDRATED DERMIS): SHX6295

## 2013-02-09 HISTORY — PX: MASTECTOMY W/ SENTINEL NODE BIOPSY: SHX2001

## 2013-02-09 HISTORY — PX: TOTAL MASTECTOMY: SHX6129

## 2013-02-09 SURGERY — MASTECTOMY WITH SENTINEL LYMPH NODE BIOPSY
Anesthesia: General | Site: Breast | Laterality: Left | Wound class: Clean

## 2013-02-09 MED ORDER — ALBUMIN HUMAN 5 % IV SOLN
INTRAVENOUS | Status: DC | PRN
  Administered 2013-02-09: 10:00:00 via INTRAVENOUS

## 2013-02-09 MED ORDER — NEOSTIGMINE METHYLSULFATE 1 MG/ML IJ SOLN
INTRAMUSCULAR | Status: DC | PRN
  Administered 2013-02-09: 4 mg via INTRAVENOUS

## 2013-02-09 MED ORDER — PHENYLEPHRINE HCL 10 MG/ML IJ SOLN
INTRAMUSCULAR | Status: DC | PRN
  Administered 2013-02-09 (×2): 40 ug via INTRAVENOUS
  Administered 2013-02-09 (×3): 80 ug via INTRAVENOUS
  Administered 2013-02-09 (×2): 40 ug via INTRAVENOUS
  Administered 2013-02-09 (×2): 80 ug via INTRAVENOUS
  Administered 2013-02-09: 40 ug via INTRAVENOUS
  Administered 2013-02-09: 80 ug via INTRAVENOUS

## 2013-02-09 MED ORDER — PANTOPRAZOLE SODIUM 40 MG PO TBEC: 80.0000 mg | DELAYED_RELEASE_TABLET | Freq: Every day | ORAL | Status: DC

## 2013-02-09 MED ORDER — CHLORHEXIDINE GLUCONATE 4 % EX LIQD: 1.0000 "application " | Freq: Once | CUTANEOUS | Status: DC

## 2013-02-09 MED ORDER — ONDANSETRON HCL 4 MG/2ML IJ SOLN: 4.0000 mg | Freq: Four times a day (QID) | INTRAMUSCULAR | Status: DC | PRN

## 2013-02-09 MED ORDER — CEFAZOLIN SODIUM 1-5 GM-% IV SOLN
1.0000 g | Freq: Three times a day (TID) | INTRAVENOUS | Status: DC
  Administered 2013-02-09 – 2013-02-11 (×6): 1 g via INTRAVENOUS
  Filled 2013-02-09 (×9): qty 50

## 2013-02-09 MED ORDER — GLYCOPYRROLATE 0.2 MG/ML IJ SOLN
INTRAMUSCULAR | Status: DC | PRN
  Administered 2013-02-09: 0.6 mg via INTRAVENOUS

## 2013-02-09 MED ORDER — ARTIFICIAL TEARS OP OINT
TOPICAL_OINTMENT | OPHTHALMIC | Status: DC | PRN
  Administered 2013-02-09: 1 via OPHTHALMIC

## 2013-02-09 MED ORDER — VENLAFAXINE HCL 75 MG PO TABS
75.0000 mg | ORAL_TABLET | Freq: Two times a day (BID) | ORAL | Status: DC
Start: 2013-02-09 — End: 2013-02-11
  Administered 2013-02-09 – 2013-02-11 (×5): 75 mg via ORAL
  Filled 2013-02-09 (×6): qty 1

## 2013-02-09 MED ORDER — FENTANYL CITRATE 0.05 MG/ML IJ SOLN
INTRAMUSCULAR | Status: DC | PRN
  Administered 2013-02-09: 25 ug via INTRAVENOUS
  Administered 2013-02-09: 50 ug via INTRAVENOUS
  Administered 2013-02-09: 100 ug via INTRAVENOUS
  Administered 2013-02-09: 25 ug via INTRAVENOUS

## 2013-02-09 MED ORDER — ROCURONIUM BROMIDE 100 MG/10ML IV SOLN
INTRAVENOUS | Status: DC | PRN
  Administered 2013-02-09: 50 mg via INTRAVENOUS

## 2013-02-09 MED ORDER — HYDROMORPHONE 0.3 MG/ML IV SOLN
INTRAVENOUS | Status: AC
  Filled 2013-02-09: qty 25

## 2013-02-09 MED ORDER — LIDOCAINE HCL (CARDIAC) 20 MG/ML IV SOLN
INTRAVENOUS | Status: DC | PRN
  Administered 2013-02-09: 65 mg via INTRAVENOUS

## 2013-02-09 MED ORDER — INDIGOTINDISULFONATE SODIUM 8 MG/ML IJ SOLN: INTRAMUSCULAR | Status: DC | PRN

## 2013-02-09 MED ORDER — 0.9 % SODIUM CHLORIDE (POUR BTL) OPTIME
TOPICAL | Status: DC | PRN
  Administered 2013-02-09 (×3): 1000 mL

## 2013-02-09 MED ORDER — FAMOTIDINE 10 MG PO TABS
10.0000 mg | ORAL_TABLET | Freq: Every day | ORAL | Status: DC
  Administered 2013-02-09: 10 mg via ORAL
  Filled 2013-02-09: qty 1

## 2013-02-09 MED ORDER — FAMOTIDINE 10 MG PO TABS
10.0000 mg | ORAL_TABLET | Freq: Two times a day (BID) | ORAL | Status: DC
  Administered 2013-02-09 – 2013-02-11 (×4): 10 mg via ORAL
  Filled 2013-02-09 (×6): qty 1

## 2013-02-09 MED ORDER — ONDANSETRON HCL 4 MG/2ML IJ SOLN
INTRAMUSCULAR | Status: DC | PRN
  Administered 2013-02-09: 4 mg via INTRAVENOUS

## 2013-02-09 MED ORDER — DEXAMETHASONE SODIUM PHOSPHATE 10 MG/ML IJ SOLN
INTRAMUSCULAR | Status: DC | PRN
  Administered 2013-02-09: 8 mg via INTRAVENOUS

## 2013-02-09 MED ORDER — HYDROMORPHONE 0.3 MG/ML IV SOLN
INTRAVENOUS | Status: DC
  Administered 2013-02-09: 12:00:00 via INTRAVENOUS
  Administered 2013-02-09: 1.79 mg via INTRAVENOUS
  Administered 2013-02-09: 4 mg via INTRAVENOUS
  Administered 2013-02-09: 0.4 mg via INTRAVENOUS
  Administered 2013-02-10: 0.6 mg via INTRAVENOUS
  Administered 2013-02-10: 0.4 mg via INTRAVENOUS
  Administered 2013-02-10: 2 mg via INTRAVENOUS

## 2013-02-09 MED ORDER — METHYLENE BLUE 1 % INJ SOLN
INTRAMUSCULAR | Status: AC
  Filled 2013-02-09: qty 10

## 2013-02-09 MED ORDER — ONDANSETRON HCL 4 MG/2ML IJ SOLN: 4.0000 mg | Freq: Once | INTRAMUSCULAR | Status: DC | PRN

## 2013-02-09 MED ORDER — PROMETHAZINE HCL 25 MG/ML IJ SOLN
6.2500 mg | INTRAMUSCULAR | Status: DC | PRN
  Administered 2013-02-10: 6.25 mg via INTRAVENOUS
  Filled 2013-02-09: qty 1

## 2013-02-09 MED ORDER — LEVOTHYROXINE SODIUM 137 MCG PO TABS
137.0000 ug | ORAL_TABLET | Freq: Every day | ORAL | Status: DC
  Administered 2013-02-10 – 2013-02-11 (×2): 137 ug via ORAL
  Filled 2013-02-09 (×3): qty 1

## 2013-02-09 MED ORDER — INDIGOTINDISULFONATE SODIUM 8 MG/ML IJ SOLN
INTRAMUSCULAR | Status: DC | PRN
  Administered 2013-02-09: 7.5 mg via INTRAVENOUS

## 2013-02-09 MED ORDER — PROPRANOLOL HCL 40 MG PO TABS
40.0000 mg | ORAL_TABLET | Freq: Once | ORAL | Status: AC
  Administered 2013-02-09: 40 mg via ORAL
  Filled 2013-02-09 (×2): qty 1

## 2013-02-09 MED ORDER — SODIUM CHLORIDE 0.9 % IV SOLN
10.0000 mg | INTRAVENOUS | Status: DC | PRN
  Administered 2013-02-09: 10 ug/min via INTRAVENOUS

## 2013-02-09 MED ORDER — SIMVASTATIN 10 MG PO TABS
10.0000 mg | ORAL_TABLET | Freq: Every day | ORAL | Status: DC
  Administered 2013-02-09 – 2013-02-10 (×2): 10 mg via ORAL
  Filled 2013-02-09 (×3): qty 1

## 2013-02-09 MED ORDER — SODIUM CHLORIDE 0.9 % IJ SOLN: 9.0000 mL | INTRAMUSCULAR | Status: DC | PRN

## 2013-02-09 MED ORDER — DIPHENHYDRAMINE HCL 50 MG/ML IJ SOLN: 12.5000 mg | Freq: Four times a day (QID) | INTRAMUSCULAR | Status: DC | PRN

## 2013-02-09 MED ORDER — ACETAMINOPHEN 10 MG/ML IV SOLN
1000.0000 mg | Freq: Once | INTRAVENOUS | Status: AC | PRN
  Administered 2013-02-09: 1000 mg via INTRAVENOUS

## 2013-02-09 MED ORDER — HYDROMORPHONE HCL PF 1 MG/ML IJ SOLN
0.2500 mg | INTRAMUSCULAR | Status: DC | PRN
  Administered 2013-02-09 (×3): 0.25 mg via INTRAVENOUS

## 2013-02-09 MED ORDER — SODIUM CHLORIDE 0.9 % IJ SOLN
INTRAMUSCULAR | Status: DC | PRN
  Administered 2013-02-09 (×2): via INTRAMUSCULAR

## 2013-02-09 MED ORDER — METHOCARBAMOL 500 MG PO TABS
500.0000 mg | ORAL_TABLET | Freq: Four times a day (QID) | ORAL | Status: DC | PRN
  Administered 2013-02-09 – 2013-02-10 (×2): 500 mg via ORAL
  Filled 2013-02-09 (×2): qty 1

## 2013-02-09 MED ORDER — VECURONIUM BROMIDE 10 MG IV SOLR
INTRAVENOUS | Status: DC | PRN
  Administered 2013-02-09 (×2): 2 mg via INTRAVENOUS
  Administered 2013-02-09: 1 mg via INTRAVENOUS

## 2013-02-09 MED ORDER — SODIUM CHLORIDE 0.9 % IR SOLN
Status: DC | PRN
  Administered 2013-02-09: 100 mL

## 2013-02-09 MED ORDER — NALOXONE HCL 0.4 MG/ML IJ SOLN: 0.4000 mg | INTRAMUSCULAR | Status: DC | PRN

## 2013-02-09 MED ORDER — PROPRANOLOL HCL 40 MG PO TABS
40.0000 mg | ORAL_TABLET | Freq: Every day | ORAL | Status: DC
  Administered 2013-02-10 – 2013-02-11 (×2): 40 mg via ORAL
  Filled 2013-02-09 (×4): qty 1

## 2013-02-09 MED ORDER — LACTATED RINGERS IV SOLN
INTRAVENOUS | Status: DC | PRN
  Administered 2013-02-09 (×3): via INTRAVENOUS

## 2013-02-09 MED ORDER — MIDAZOLAM HCL 5 MG/5ML IJ SOLN
INTRAMUSCULAR | Status: DC | PRN
  Administered 2013-02-09: 2 mg via INTRAVENOUS

## 2013-02-09 MED ORDER — SODIUM CHLORIDE 0.9 % IR SOLN
Status: DC | PRN
  Administered 2013-02-09 (×2)

## 2013-02-09 MED ORDER — PROPOFOL 10 MG/ML IV BOLUS
INTRAVENOUS | Status: DC | PRN
  Administered 2013-02-09: 150 mg via INTRAVENOUS

## 2013-02-09 MED ORDER — DEXTROSE-NACL 5-0.45 % IV SOLN
INTRAVENOUS | Status: DC
  Administered 2013-02-09 – 2013-02-10 (×2): via INTRAVENOUS

## 2013-02-09 MED ORDER — DIPHENHYDRAMINE HCL 12.5 MG/5ML PO ELIX: 12.5000 mg | ORAL_SOLUTION | Freq: Four times a day (QID) | ORAL | Status: DC | PRN

## 2013-02-09 MED ORDER — TECHNETIUM TC 99M SULFUR COLLOID FILTERED
1.0000 | Freq: Once | INTRAVENOUS | Status: AC | PRN
  Administered 2013-02-09: 1 via INTRADERMAL

## 2013-02-09 SURGICAL SUPPLY — 83 items
ADH SKN CLS APL DERMABOND .7 (GAUZE/BANDAGES/DRESSINGS) ×2
APPLIER CLIP 9.375 MED OPEN (MISCELLANEOUS) ×2
APPLIER CLIP 9.375 SM OPEN (CLIP) ×2
APR CLP MED 9.3 20 MLT OPN (MISCELLANEOUS) ×1
APR CLP SM 9.3 20 MLT OPN (CLIP) ×1
ATCH SMKEVC FLXB CAUT HNDSWH (FILTER) ×1 IMPLANT
BAG DECANTER FOR FLEXI CONT (MISCELLANEOUS) ×2 IMPLANT
BINDER BREAST LRG (GAUZE/BANDAGES/DRESSINGS) IMPLANT
BINDER BREAST XLRG (GAUZE/BANDAGES/DRESSINGS) ×1 IMPLANT
BIOPATCH RED 1 DISK 7.0 (GAUZE/BANDAGES/DRESSINGS) ×5 IMPLANT
CANISTER SUCTION 2500CC (MISCELLANEOUS) ×4 IMPLANT
CHLORAPREP W/TINT 26ML (MISCELLANEOUS) ×4 IMPLANT
CLIP APPLIE 9.375 MED OPEN (MISCELLANEOUS) ×1 IMPLANT
CLIP APPLIE 9.375 SM OPEN (CLIP) IMPLANT
CLOTH BEACON ORANGE TIMEOUT ST (SAFETY) ×4 IMPLANT
CONT SPEC 4OZ CLIKSEAL STRL BL (MISCELLANEOUS) ×4 IMPLANT
COVER PROBE W GEL 5X96 (DRAPES) ×2 IMPLANT
COVER SURGICAL LIGHT HANDLE (MISCELLANEOUS) ×4 IMPLANT
DERMABOND ADVANCED (GAUZE/BANDAGES/DRESSINGS) ×2
DERMABOND ADVANCED .7 DNX12 (GAUZE/BANDAGES/DRESSINGS) ×2 IMPLANT
DRAIN CHANNEL 19F RND (DRAIN) ×5 IMPLANT
DRAPE LAPAROSCOPIC ABDOMINAL (DRAPES) ×2 IMPLANT
DRAPE ORTHO SPLIT 77X108 STRL (DRAPES) ×4
DRAPE PROXIMA HALF (DRAPES) ×5 IMPLANT
DRAPE SURG 17X23 STRL (DRAPES) ×7 IMPLANT
DRAPE SURG ORHT 6 SPLT 77X108 (DRAPES) ×2 IMPLANT
DRAPE UTILITY 15X26 W/TAPE STR (DRAPE) ×4 IMPLANT
DRAPE WARM FLUID 44X44 (DRAPE) ×2 IMPLANT
DRSG ADAPTIC 3X8 NADH LF (GAUZE/BANDAGES/DRESSINGS) ×2 IMPLANT
DRSG PAD ABDOMINAL 8X10 ST (GAUZE/BANDAGES/DRESSINGS) ×5 IMPLANT
DRSG TEGADERM 4X4.75 (GAUZE/BANDAGES/DRESSINGS) ×4 IMPLANT
ELECT BLADE 4.0 EZ CLEAN MEGAD (MISCELLANEOUS) ×2
ELECT BLADE 6.5 EXT (BLADE) ×1 IMPLANT
ELECT CAUTERY BLADE 6.4 (BLADE) ×4 IMPLANT
ELECT REM PT RETURN 9FT ADLT (ELECTROSURGICAL) ×4
ELECTRODE BLDE 4.0 EZ CLN MEGD (MISCELLANEOUS) ×1 IMPLANT
ELECTRODE REM PT RTRN 9FT ADLT (ELECTROSURGICAL) ×2 IMPLANT
EVACUATOR SILICONE 100CC (DRAIN) ×6 IMPLANT
EVACUATOR SMOKE ACCUVAC VALLEY (FILTER) ×1
GLOVE BIO SURGEON STRL SZ7 (GLOVE) ×2 IMPLANT
GLOVE BIO SURGEON STRL SZ7.5 (GLOVE) ×2 IMPLANT
GLOVE BIOGEL PI IND STRL 6.5 (GLOVE) IMPLANT
GLOVE BIOGEL PI IND STRL 7.0 (GLOVE) IMPLANT
GLOVE BIOGEL PI IND STRL 7.5 (GLOVE) IMPLANT
GLOVE BIOGEL PI IND STRL 8 (GLOVE) ×1 IMPLANT
GLOVE BIOGEL PI INDICATOR 6.5 (GLOVE) ×1
GLOVE BIOGEL PI INDICATOR 7.0 (GLOVE) ×2
GLOVE BIOGEL PI INDICATOR 7.5 (GLOVE) ×1
GLOVE BIOGEL PI INDICATOR 8 (GLOVE) ×1
GLOVE EUDERMIC 7 POWDERFREE (GLOVE) ×3 IMPLANT
GLOVE SS BIOGEL STRL SZ 6.5 (GLOVE) IMPLANT
GLOVE SUPERSENSE BIOGEL SZ 6.5 (GLOVE) ×1
GOWN PREVENTION PLUS XLARGE (GOWN DISPOSABLE) ×2 IMPLANT
GOWN STRL NON-REIN LRG LVL3 (GOWN DISPOSABLE) ×8 IMPLANT
IMPL BREAST TIS EXP M 550CC (Breast) IMPLANT
IMPLANT BREAST TIS EXP M 550CC (Breast) ×2 IMPLANT
KIT BASIN OR (CUSTOM PROCEDURE TRAY) ×3 IMPLANT
KIT DISPOSABLE SPY ELITE (KITS) ×1 IMPLANT
KIT ROOM TURNOVER OR (KITS) ×3 IMPLANT
MARKER SKIN DUAL TIP RULER LAB (MISCELLANEOUS) ×3 IMPLANT
NDL 18GX1X1/2 (RX/OR ONLY) (NEEDLE) ×1 IMPLANT
NDL HYPO 25GX1X1/2 BEV (NEEDLE) ×1 IMPLANT
NEEDLE 18GX1X1/2 (RX/OR ONLY) (NEEDLE) ×2 IMPLANT
NEEDLE HYPO 25GX1X1/2 BEV (NEEDLE) ×2 IMPLANT
NS IRRIG 1000ML POUR BTL (IV SOLUTION) ×6 IMPLANT
PACK GENERAL/GYN (CUSTOM PROCEDURE TRAY) ×4 IMPLANT
PAD ARMBOARD 7.5X6 YLW CONV (MISCELLANEOUS) ×4 IMPLANT
PREFILTER EVAC NS 1 1/3-3/8IN (MISCELLANEOUS) ×2 IMPLANT
SPECIMEN JAR X LARGE (MISCELLANEOUS) ×2 IMPLANT
SPONGE GAUZE 4X4 12PLY (GAUZE/BANDAGES/DRESSINGS) ×4 IMPLANT
SPONGE LAP 4X18 X RAY DECT (DISPOSABLE) ×2 IMPLANT
STAPLER VISISTAT 35W (STAPLE) ×2 IMPLANT
SUT ETHILON 2 0 FS 18 (SUTURE) ×2 IMPLANT
SUT MNCRL AB 3-0 PS2 18 (SUTURE) ×10 IMPLANT
SUT MON AB 4-0 PC3 18 (SUTURE) ×2 IMPLANT
SUT PROLENE 3 0 PS 2 (SUTURE) ×4 IMPLANT
SUT VIC AB 3-0 SH 18 (SUTURE) ×5 IMPLANT
SYR BULB IRRIGATION 50ML (SYRINGE) ×2 IMPLANT
SYR CONTROL 10ML LL (SYRINGE) ×2 IMPLANT
TOWEL OR 17X24 6PK STRL BLUE (TOWEL DISPOSABLE) ×4 IMPLANT
TOWEL OR 17X26 10 PK STRL BLUE (TOWEL DISPOSABLE) ×4 IMPLANT
TRAY FOLEY CATH 14FRSI W/METER (CATHETERS) ×1 IMPLANT
TUBE CONNECTING 12X1/4 (SUCTIONS) ×2 IMPLANT

## 2013-02-09 NOTE — H&P (View-Only) (Signed)
NAME: Ariel Henderson       DOB: 12/16/1945           DATE: 01/10/2013       MRN:[id number]  CC:  Chief Complaint  Patient presents with  . Routine Post Op    Lt br ductal exc   HPI: patient underwent left ductal excision for what appeared to be some filling defects in discharge. Unfortunately, the pathology report shows extensive DCIS with positive margins. She comes in today to discuss options for treatment and for postop followup. She's had a modest amount of pain but this is improving. She's had no other problems.   EXAM: Vital signs: BP 122/80  Pulse 64  Temp(Src) 96.7 F (35.9 C) (Temporal)  Resp 14  Ht 5' 2" (1.575 m)  Wt 170 lb 9.6 oz (77.384 kg)  BMI 31.2 kg/m2  SpO2 98%  General: Patient alert, oriented, NAD  An incision is healing nicely with no evidence of infection or hematoma. Slightly tender.  Data reviewed the MRI shows what appears to be residual disease at the lateral aspect of the biopsy cavity. Right breast is completely normal. The pathology report shows high-grade ductal carcinoma in situ with associated comedo necrosis extending to multiple foci of inked margins. Estrogen and progesterone receptors are both negative at 0% IMP: stage 0 centrally located left breast cancer, receptor negative  PLAN:  . I view of her alternatives with the patient.  She would like to have a total mastectomy.she is also interested in reconstruction. I think this is appropriate therapy in this patient as the disease is fairly extensive, she has relatively small breasts, and would require a central partial mastectomy as a lumpectomy. I also reviewed this with her husband. I have explained the pathophysiology and staging of breast cancer with particular attention to her exact situation. We discussed the multidisciplinary approach to breast cancer which often includes both medical and radiation oncology consultations.  We also discussed surgical options for the treatment of breast  cancer including lumpectomy and mastectomy with possible reconstructive surgery. In addition we talked about the evaluation and management of lymph nodes including a description of sentinel lymph node biopsy and axillary dissections. We reviewed potential complications and risks including bleeding, infection, numbness,  lymphedema, and the potential need for additional surgery.  She understands that for patients who are candidate for lumpectomy or mastectomy there is an equal survival rate with either technique, but a slightly higher local recurrence rate with lumpectomy. In addition she knows that a lumpectomy usually requires postoperative radiation as part of the management of the breast cancer.  We have discussed the likely postoperative course and plans for followup.  I have given the patient some written information that reviewed all of these issues. I believe her questions are answered and that she has a good understanding of the issues.   Brigid Vandekamp J 01/10/2013   

## 2013-02-09 NOTE — Transfer of Care (Signed)
Immediate Anesthesia Transfer of Care Note  Patient: Ariel Henderson  Procedure(s) Performed: Procedure(s): TOTAL MASTECTOMY WITH SENTINEL LYMPH NODE BIOPSY (Left) LEFT BREAST RECONSTRUCTION WITH PLACEMENT OF TISSUE EXPANDER  (Left)  Patient Location: PACU  Anesthesia Type:General  Level of Consciousness: awake, alert , oriented and patient cooperative  Airway & Oxygen Therapy: Patient Spontanous Breathing and Patient connected to nasal cannula oxygen  Post-op Assessment: Report given to PACU RN, Post -op Vital signs reviewed and stable and Patient moving all extremities X 4  Post vital signs: Reviewed and stable  Complications: No apparent anesthesia complications

## 2013-02-09 NOTE — Anesthesia Postprocedure Evaluation (Signed)
  Anesthesia Post-op Note  Patient: Ariel Henderson  Procedure(s) Performed: Procedure(s): TOTAL MASTECTOMY WITH SENTINEL LYMPH NODE BIOPSY (Left) LEFT BREAST RECONSTRUCTION WITH PLACEMENT OF TISSUE EXPANDER  (Left)  Patient Location: PACU  Anesthesia Type:General  Level of Consciousness: awake, alert  and oriented  Airway and Oxygen Therapy: Patient Spontanous Breathing and Patient connected to nasal cannula oxygen  Post-op Pain: mild  Post-op Assessment: Post-op Vital signs reviewed, Patient's Cardiovascular Status Stable, Patent Airway and Pain level controlled  Post-op Vital Signs: stable  Complications: No apparent anesthesia complications

## 2013-02-09 NOTE — Anesthesia Preprocedure Evaluation (Signed)
Anesthesia Evaluation  Patient identified by MRN, date of birth, ID band Patient awake    Reviewed: Allergy & Precautions, H&P , NPO status , Patient's Chart, lab work & pertinent test results, reviewed documented beta blocker date and time   Airway Mallampati: II      Dental  (+) Teeth Intact and Dental Advisory Given   Pulmonary  breath sounds clear to auscultation        Cardiovascular Rhythm:Regular Rate:Normal     Neuro/Psych    GI/Hepatic   Endo/Other    Renal/GU      Musculoskeletal   Abdominal   Peds  Hematology   Anesthesia Other Findings   Reproductive/Obstetrics                           Anesthesia Physical Anesthesia Plan  ASA: II  Anesthesia Plan: General   Post-op Pain Management:    Induction: Intravenous  Airway Management Planned: Oral ETT  Additional Equipment:   Intra-op Plan:   Post-operative Plan:   Informed Consent: I have reviewed the patients History and Physical, chart, labs and discussed the procedure including the risks, benefits and alternatives for the proposed anesthesia with the patient or authorized representative who has indicated his/her understanding and acceptance.   Dental advisory given  Plan Discussed with: CRNA and Anesthesiologist  Anesthesia Plan Comments: (L. Breast Ca. Htn Anxiety GERD  Plan GA with oral ETT  Kipp Brood, MD)        Anesthesia Quick Evaluation

## 2013-02-09 NOTE — Interval H&P Note (Signed)
History and Physical Interval Note:  02/09/2013 7:11 AM  Ariel Henderson  has presented today for surgery, with the diagnosis of left breast cancer  The various methods of treatment have been discussed with the patient and family. After consideration of risks, benefits and other options for treatment, the patient has consented to  Procedure(s): Total MASTECTOMY WITH SENTINEL LYMPH NODE BIOPSY (Left) LEFT BREAST RECONSTRUCTION WITH PLACEMENT OF TISSUE EXPANDER AND FLEX HD (ACELLULAR HYDRATED DERMIS) (Left) as a surgical intervention .  The patient's history has been reviewed, patient examined, no change in status, stable for surgery.  I have reviewed the patient's chart and labs.  Questions were answered to the patient's satisfaction.    Exam today: VS:BP 146/86  Pulse 78  Temp(Src) 97.6 F (36.4 C) (Oral)  Resp 20  SpO2 95% General: Alert, NAD, mildly anxious about surgery Lungs: Clear Heart: Reg, No M, R, G. Abd: Soft, benign  Imp; Left breast cancer Plan Proceed with surgery as above   Tomeeka Plaugher J

## 2013-02-09 NOTE — Preoperative (Signed)
Beta Blockers   Reason not to administer Beta Blockers:Not Applicable 

## 2013-02-09 NOTE — Op Note (Signed)
Ariel Henderson 1946-03-26 [id number] 01/13/2013  Preoperative diagnosis: Left breast cancer, central, clinical Stage1  Postoperative diagnosis: Same  Procedure: Left total mastectomy, blue dye injection and sentinel node dissection  Surgeon: Currie Paris  Anesthesia:General  Clinical History and Indications:The patient is seen in the holding area and we reviewed the plans for the procedure as noted above. We reviewed the risks and complications a final time. She had no further questions. I marked theleft side as the operative side.  Description of Procedure: The patient was taken to the operating room. After satisfactory general anesthesia was obtained the timeout was done.   I then injected 5 cc of dilute methylene blue and injected it subareaorly and massaged it in. A full prep and drape was then done.  I outlined an elliptical incision and marked the inframammary fold and midline. The incision was made. The usual skin flaps were raised. I went to the clavicle superiorly and sternum medially and towards the latissimus laterally.    After the superior flap was complete I was able to use the neoprobe to locate 2 sentinel nodes  Once the nodes were removed they were forwarded to pathology.   The inferior flap was then made going to the inframammary fold and out to the latissimus. The breast was then removed from the pectoralis starting medially and working laterally. When I got to the lateral aspect of the pectoralis major muscle I opened the clavipectoral fascia. I finished removing the breast from the serratus muscles.  I then completed the mastectomy by dividing the remaining attachments from the breast to the chest wall and latissimus but not taking any more tissue from the axilla. The specimen was marked to orient it for the pathologist and passed off the table.  At this point the pathologist reported on the sentinel node and that it was negative.  I spent several minutes  irrigating and making sure everything was dry. Another irrigation and check for hemostasis was made. Everything appeared to be dry. The patient tolerated the procedure well. There were no operative complications. All counts were correct. Estimated blood loss was 50cc .Dr Odis Luster then scrubbed in to do the reconstruction  Currie Paris, MD, [facility] 02/09/2013 9:13 AM

## 2013-02-09 NOTE — Progress Notes (Signed)
Pt did not take Propranolol this AM prior to surgery, ordered per anesthesia protocol.

## 2013-02-09 NOTE — Brief Op Note (Signed)
02/09/2013  10:57 AM  PATIENT:  Ariel Henderson  67 y.o. female  PRE-OPERATIVE DIAGNOSIS:  Left breast cancer  POST-OPERATIVE DIAGNOSIS:  Left breast cancer  PROCEDURE:  Procedure(s): TOTAL MASTECTOMY WITH SENTINEL LYMPH NODE BIOPSY (Left) LEFT BREAST RECONSTRUCTION WITH PLACEMENT OF TISSUE EXPANDER  (Left)  SURGEON:  Surgeon(s) and Role: Panel 1:    * Christian Leta Jungling, MD - Primary  Panel 2:    * Etter Sjogren, MD - Primary  PHYSICIAN ASSISTANT:   ASSISTANTS: none   ANESTHESIA:   general  EBL:  Total I/O In: 2550 [I.V.:2300; IV Piggyback:250] Out: 60 [Urine:30; Blood:30]  BLOOD ADMINISTERED:none  DRAINS: (2) Jackson-Pratt drain(s) with closed bulb suction in the left mastectomy space   LOCAL MEDICATIONS USED:  NONE  SPECIMEN:  No Specimen  DISPOSITION OF SPECIMEN:  N/A  COUNTS:  YES  TOURNIQUET:  * No tourniquets in log *  DICTATION: .Other Dictation: Dictation Number [id number]  PLAN OF CARE: Admit to inpatient   PATIENT DISPOSITION:  PACU - hemodynamically stable.   Delay start of Pharmacological VTE agent (>24hrs) due to surgical blood loss or risk of bleeding: no

## 2013-02-10 ENCOUNTER — Encounter (HOSPITAL_COMMUNITY): Payer: Self-pay | Admitting: Surgery

## 2013-02-10 MED ORDER — ONDANSETRON HCL 4 MG/2ML IJ SOLN: 4.0000 mg | Freq: Four times a day (QID) | INTRAMUSCULAR | Status: DC | PRN

## 2013-02-10 MED ORDER — HEPARIN SODIUM (PORCINE) 5000 UNIT/ML IJ SOLN
5000.0000 [IU] | Freq: Three times a day (TID) | INTRAMUSCULAR | Status: DC
  Administered 2013-02-10 – 2013-02-11 (×4): 5000 [IU] via SUBCUTANEOUS
  Filled 2013-02-10 (×4): qty 1

## 2013-02-10 MED ORDER — HYDROMORPHONE HCL 2 MG PO TABS
2.0000 mg | ORAL_TABLET | ORAL | Status: DC | PRN
  Administered 2013-02-10 (×3): 2 mg via ORAL
  Filled 2013-02-10 (×3): qty 1

## 2013-02-10 NOTE — Progress Notes (Signed)
Subjective: Good pain control. No nausea. Tolerating diet.  Objective: Vital signs in last 24 hours: Temp:  [97.5 F (36.4 C)-98 F (36.7 C)] 97.8 F (36.6 C) (06/06 0531) Pulse Rate:  [54-72] 70 (06/06 0531) Resp:  [10-18] 12 (06/06 0800) BP: (99-147)/(46-86) 106/46 mmHg (06/06 0531) SpO2:  [94 %-100 %] 96 % (06/06 0800) FiO2 (%):  [95 %-96 %] 96 % (06/06 0800) Weight:  [180 lb 1.6 oz (81.693 kg)] 180 lb 1.6 oz (81.693 kg) (06/05 1316)  Intake/Output from previous day: 06/05 0701 - 06/06 0700 In: 4761.8 [P.O.:740; I.V.:3771.8; IV Piggyback:250] Out: 1289 [Urine:1080; Drains:179; Blood:30] Intake/Output this shift:    Operative sites: Mastectomy flaps viable. Tissue expander appears in good position. Drains functioning. Drainage thin.   Recent Labs  02/07/13 1258  WBC 8.2  HGB 11.3*  HCT 34.9*  NA 139  K 5.0  CL 105  CO2 27  BUN 15  CREATININE 0.67    Studies/Results: Nm Sentinel Node Inj-no Rpt (breast)  02/09/2013   CLINICAL DATA: left breast cancer   Sulfur colloid was injected intradermally by the nuclear medicine  technologist for breast cancer sentinel node localization.     Assessment/Plan: D/C foley and dilaudid PCA. PO pain med. Ambulate. Instruct in drain care   LOS: 1 day    Ariel Henderson M 02/10/2013 8:40 AM

## 2013-02-10 NOTE — Progress Notes (Signed)
1 Day Post-Op   Assessment: s/p Procedure(s): TOTAL MASTECTOMY WITH SENTINEL LYMPH NODE BIOPSY LEFT BREAST RECONSTRUCTION WITH PLACEMENT OF TISSUE EXPANDER  Patient Active Problem List   Diagnosis Date Noted  . Breast cancer, left breast 01/10/2013    Priority: High  . Thyroid cancer 02/01/2013  . Breast cancer 02/01/2013  . Melanoma of skin, site unspecified 02/01/2013  . Family history of malignant neoplasm of gastrointestinal tract 02/01/2013  . Family history of ovarian cancer 02/01/2013  . Family history of prostate cancer 02/01/2013  . Breast pain 04/15/2012    Stable one day post op  Plan: D/C per Dr Odis Luster. I will call her when path available and plan to see in about three weeks  Subjective: Feels OK ,weak, tired  Objective: Vital signs in last 24 hours: Temp:  [97.5 F (36.4 C)-98 F (36.7 C)] 97.8 F (36.6 C) (06/06 0531) Pulse Rate:  [54-72] 70 (06/06 0531) Resp:  [10-18] 14 (06/06 0531) BP: (99-147)/(46-86) 106/46 mmHg (06/06 0531) SpO2:  [94 %-100 %] 97 % (06/06 0531) FiO2 (%):  [95 %] 95 % (06/05 1536) Weight:  [180 lb 1.6 oz (81.693 kg)] 180 lb 1.6 oz (81.693 kg) (06/05 1316)   Intake/Output from previous day: 06/05 0701 - 06/06 0700 In: 4761.8 [P.O.:740; I.V.:3771.8; IV Piggyback:250] Out: 1289 [Urine:1080; Drains:179; Blood:30]  General appearance: alert, cooperative and no distress Resp: clear to auscultation bilaterally Cardio: regular rate and rhythm, S1, S2 normal, no murmur, click, rub or gallop  Incision: Looks OK and drains are thin  Lab Results:   Recent Labs  02/07/13 1258  WBC 8.2  HGB 11.3*  HCT 34.9*  PLT 317   BMET  Recent Labs  02/07/13 1258  NA 139  K 5.0  CL 105  CO2 27  GLUCOSE 103*  BUN 15  CREATININE 0.67  CALCIUM 9.3    MEDS, Scheduled .  ceFAZolin (ANCEF) IV  1 g Intravenous Q8H  . famotidine  10 mg Oral BID  . HYDROmorphone PCA 0.3 mg/mL   Intravenous Q4H  . levothyroxine  137 mcg Oral QAC  breakfast  . propranolol  40 mg Oral QAC breakfast  . simvastatin  10 mg Oral q1800  . venlafaxine  75 mg Oral BID    Studies/Results: Nm Sentinel Node Inj-no Rpt (breast)  02/09/2013   CLINICAL DATA: left breast cancer   Sulfur colloid was injected intradermally by the nuclear medicine  technologist for breast cancer sentinel node localization.       LOS: 1 day     Currie Paris, MD, [facility], [place] [phone]   02/10/2013 7:42 AM

## 2013-02-10 NOTE — Care Management Note (Signed)
  Page 1 of 1   02/10/2013     12:06:55 PM   CARE MANAGEMENT NOTE 02/10/2013  Patient:  Ariel Henderson, Ariel Henderson   Account Number:  [id number]  Date Initiated:  02/10/2013  Documentation initiated by:  Ronny Flurry  Subjective/Objective Assessment:     Action/Plan:   Anticipated DC Date:  02/11/2013   Anticipated DC Plan:  HOME W HOME HEALTH SERVICES         Choice offered to / List presented to:  C-1 Patient        HH arranged  HH-1 RN      [facility] agency  [facility]   Status of service:   Medicare Important Message given?   (If response is "NO", the following Medicare IM given date fields will be blank) Date Medicare IM given:   Date Additional Medicare IM given:    Discharge Disposition:    Per UR Regulation:    If discussed at Long Length of Stay Meetings, dates discussed:    Comments:  02-10-13 Spoke with patient , daughter and husband at bedside regarding home health. Patinet deferred all decision making to her daughter .  Daughter wanted [facility]  referral made however , denied by [facility] due to insurance.  Daughter then wanted [facility] , spoke with Patton Salles at [facility] , start of care day would by Tuesday , possibilty Monday , daughter feels like her mother needs home health visit Sunday . Daughter wanted to try [facility] . referral made , start of care will be Sunday 02-12-13.  Face sheet information confirmed with daughter  Ronny Flurry RN BSN [phone]

## 2013-02-10 NOTE — Op Note (Signed)
Ariel Henderson, SHRODE NO.:  [id number]  MEDICAL RECORD NO.:  [id number]  LOCATION:  [facility]                          FACILITY:  [facility]  PHYSICIAN:  Etter Sjogren, M.D.     DATE OF BIRTH:  1946/07/02  DATE OF PROCEDURE:  02/09/2013 DATE OF DISCHARGE:                              OPERATIVE REPORT   PREOPERATIVE DIAGNOSIS:  Left breast cancer.  POSTOPERATIVE DIAGNOSIS:  Left breast cancer.  PROCEDURE PERFORMED:  left breast reconstruction with tissue expander.  SURGEON:  Etter Sjogren, M.D.  ANESTHESIA:  General.  ESTIMATED BLOOD LOSS:  10 mL.  DRAINS:  50 [place].  CLINICAL NOTE:  A 67 year old woman has left breast cancer and is having mastectomy.  She desired reconstruction.  Options discussed and she selected placement of tissue expander at that time of the mastectomy as a stage procedure for eventual placement of the implant.  She understood the risks, possible complications included but not limited to, bleeding, infection, healing problems, scarring, loss of sensation, fluid accumulations, loss of tissue, loss of skin of the mastectomy flaps, anesthesia complications, failure of device, capsular contracture, displacement device, wrinkles, ripples, pneumothorax, pulmonary embolism, contour deformities, especially around the periphery of the reconstruction, and chronic pain, and overall disappointment.  She understood all this and wished to proceed.  DESCRIPTION OF PROCEDURE:  The patient was taken to the operating room, and she had her left mastectomy.  The space looked very good.  The spy elite machine was used to check the mastectomy flaps and they were noted to be viable.  There were no questionable areas.  The irrigation with saline.  The submuscular space was created against the lateral border of the pectoralis muscle, extending medially and then laterally underneath the serratus anterior for short distance, and then inferiorly down the inframammary  crease.  Thorough irrigation with saline as well as antibiotic solution and meticulous hemostasis with electrocautery. After thoroughly cleaning gloves, the expander was prepared.  This was a Mentor 550 mL tissue expander.  It was soaked in antibiotic solution for greater than 5 minutes.  A 100 mL sterile saline placed using a closed filling system and the expander was then positioned after again checking for hemostasis, which was noted to be excellent.  The expander was positioned to the inferior most aspect of the submuscular space and care was made to make sure that was properly oriented.  The muscle was then closed with 3-0 Vicryl interrupted figure-of-eight sutures, and again the hemostasis achieved in the mastectomy space using electrocautery. Two 19-French drains were positioned, brought through separate stab wounds inferolaterally with subcutaneous tunnels greater than 5 cm. These were secured with 3-0 Prolene sutures.  The skin closure with 3-0 Monocryl interrupted, inverted, deep dermal sutures and Dermabond.  Biopatch and Tegaderm applied around the drains and dry sterile dressings with the chest vest placed as well.  She was then transferred to the recovery room in stable having tolerated the procedure well.     Etter Sjogren, M.D.     DB/MEDQ  D:  02/09/2013  T:  02/10/2013  Job:  [id number]

## 2013-02-10 NOTE — Progress Notes (Signed)
UR completed 

## 2013-02-11 MED ORDER — CEPHALEXIN 500 MG PO CAPS
500.0000 mg | ORAL_CAPSULE | Freq: Two times a day (BID) | ORAL | Status: DC
  Administered 2013-02-11: 500 mg via ORAL
  Filled 2013-02-11: qty 1

## 2013-02-11 MED ORDER — METHOCARBAMOL 500 MG PO TABS: 500.0000 mg | ORAL_TABLET | Freq: Four times a day (QID) | ORAL | Status: DC | PRN

## 2013-02-11 MED ORDER — CEPHALEXIN 500 MG PO CAPS: 500.0000 mg | ORAL_CAPSULE | Freq: Two times a day (BID) | ORAL | Status: DC

## 2013-02-11 MED ORDER — HYDROMORPHONE HCL 2 MG PO TABS: 2.0000 mg | ORAL_TABLET | ORAL | Status: DC | PRN

## 2013-02-11 NOTE — Discharge Planning (Signed)
Home instructions reviewed with pt and daughter, instructed on [facility] care, [facility] to f/u. Copy of instructions and rx given to pt.  Will dc per w/c with all personal belongings to private car, acomp. By family.

## 2013-02-11 NOTE — Discharge Summary (Signed)
Physician Discharge Summary  Patient ID: Ariel Henderson MRN: [id number] DOB/AGE: 04/08/1946 67 y.o.  Admit date: 02/09/2013 Discharge date: 02/11/2013  Admission Diagnoses: Left breast cancer  Discharge Diagnoses: Same Principal Problem:   Breast cancer, left breast   Discharged Condition: good  Hospital Course: On the day of admission the patient was taken to surgery and had left mastectomy and reconstruction with tissue expander. The patient tolerated the procedures well. Postoperatively, the mastectomy flaps maintained excellent color and capillary refill. The patient was ambulatory and tolerating diet on the first postoperative day. She had DVT prophylaxis pre-op and post-op. She is ambulating well, tolerating diet, and ready for disaharge..  Treatments: antibiotics: Ancef, anticoagulation: heparin and surgery: left mastectomy and placement left tissue expander.  Discharge Exam: Blood pressure 104/54, pulse 75, temperature 98.4 F (36.9 C), temperature source Oral, resp. rate 18, height 5\' 2"  (1.575 m), weight 180 lb 1.6 oz (81.693 kg), SpO2 94.00%.  Operative sites: Mastectomy flaps viable. Tissue expander in good position. Drains functioning. Drainage thin.  Disposition: 01-Home or Self Care   Future Appointments Provider Department Dept Phone   03/07/2013 1:50 PM Currie Paris, MD [facility], [place] [phone]       Medication List    ASK your doctor about these medications       acetaminophen 500 MG tablet  Commonly known as:  TYLENOL  Take 500 mg by mouth every 6 (six) hours as needed for pain.     almotriptan 12.5 MG tablet  Commonly known as:  AXERT  Take 12.5 mg by mouth daily as needed for migraine. may repeat in 2 hours if needed     aspirin EC 81 MG tablet  Take 81 mg by mouth daily.     diazepam 10 MG tablet  Commonly known as:  VALIUM  Take 10 mg by mouth every 12 (twelve) hours as needed for anxiety.     estradiol 1 MG tablet   Commonly known as:  ESTRACE  Take 1 mg by mouth daily.     Fish Oil 1000 MG Caps  Take 1,000 mg by mouth daily.     HYDROcodone-acetaminophen 5-325 MG per tablet  Commonly known as:  NORCO/VICODIN  Take 1 tablet by mouth every 4 (four) hours as needed for pain.     levothyroxine 137 MCG tablet  Commonly known as:  SYNTHROID, LEVOTHROID  Take 137 mcg by mouth daily before breakfast.     lovastatin 20 MG tablet  Commonly known as:  MEVACOR  Take 20 mg by mouth at bedtime.     omeprazole 40 MG capsule  Commonly known as:  PRILOSEC  Take 40 mg by mouth daily.     propranolol 40 MG tablet  Commonly known as:  INDERAL  Take 40 mg by mouth daily before breakfast.     ranitidine 75 MG tablet  Commonly known as:  ZANTAC  Take 75 mg by mouth 2 (two) times daily.     venlafaxine 75 MG tablet  Commonly known as:  EFFEXOR  Take 75 mg by mouth 2 (two) times daily.     vitamin E 400 UNIT capsule  Take 400 Units by mouth daily.           Follow-up Information   Follow up with Currie Paris, MD In 3 weeks.   Contact information:   [address] [place] [address] [phone]       Signed: Rossie Muskrat 02/11/2013, 8:32 AM

## 2013-02-11 NOTE — Progress Notes (Signed)
Patient dressing to go home. Doing well. Discussed that I will call her when path report is available. She will see DB next week and me after that

## 2013-02-13 ENCOUNTER — Telehealth (INDEPENDENT_AMBULATORY_CARE_PROVIDER_SITE_OTHER): Payer: Self-pay | Admitting: General Surgery

## 2013-02-13 NOTE — Telephone Encounter (Signed)
Message copied by Liliana Cline on Mon Feb 13, 2013  3:53 PM ------      Message from: Currie Paris      Created: Mon Feb 13, 2013  3:48 PM       Tell the patient that her margins are OK and her lymph nodes are negative. I will discuss in detail in the office. ------

## 2013-02-13 NOTE — Telephone Encounter (Signed)
Tried to call patient. Number busy. Will try again later.

## 2013-02-14 NOTE — Telephone Encounter (Signed)
Spoke with pt - pt aware of results.  Also confirmed that pt is aware of her July 1st appt.

## 2013-02-16 ENCOUNTER — Telehealth (INDEPENDENT_AMBULATORY_CARE_PROVIDER_SITE_OTHER): Payer: Self-pay

## 2013-02-16 NOTE — Telephone Encounter (Signed)
Returned pt's call - pt has drains in, rescheduled her appt from July 1 to June 20.

## 2013-02-23 ENCOUNTER — Encounter: Payer: Self-pay | Admitting: Genetic Counselor

## 2013-02-24 ENCOUNTER — Ambulatory Visit (INDEPENDENT_AMBULATORY_CARE_PROVIDER_SITE_OTHER): Payer: Medicare Other | Admitting: Surgery

## 2013-02-24 ENCOUNTER — Encounter (INDEPENDENT_AMBULATORY_CARE_PROVIDER_SITE_OTHER): Payer: Self-pay | Admitting: Surgery

## 2013-02-24 VITALS — BP 118/72 | HR 72 | Temp 99.0°F | Resp 16 | Ht 62.0 in | Wt 163.6 lb

## 2013-02-24 DIAGNOSIS — C50912 Malignant neoplasm of unspecified site of left female breast: Secondary | ICD-10-CM

## 2013-02-24 DIAGNOSIS — C50919 Malignant neoplasm of unspecified site of unspecified female breast: Secondary | ICD-10-CM

## 2013-02-24 NOTE — Progress Notes (Signed)
Ariel Henderson    [id number] 02/24/2013    09/17/1945   CC:   Chief Complaint  Patient presents with  . Routine Post Op    1st po l breast     HPI:  The patient returns for post op follow-up. She underwent a left mastectommy/SLN and reconstruction on 02/09/13. Over all she feels that she is doing well. No particular problems  PE: VITAL SIGNS: BP 118/72  Pulse 72  Temp(Src) 99 F (37.2 C) (Temporal)  Resp 16  Ht 5\' 2"  (1.575 m)  Wt 163 lb 9.6 oz (74.208 kg)  BMI 29.92 kg/m2  Breast: The incision is healing nicely and there is no evidence of infection or hematoma.  The drain only is 7-10 cc per day for the last few days.Marland Kitchen  DATA REVIEWED: Pathology report: Diagnosis 1. Lymph node, sentinel, biopsy, Left #1 - ONE LYMPH NODE, NEGATIVE FOR METASTATIC CARCINOMA (0/1). 2. Lymph node, sentinel, biopsy, Left #2 - ONE LYMPH NODE, NEGATIVE FOR METASTATIC CARCINOMA (0/1). 1 of 4 FINAL for [name], [name] [phone]) Diagnosis(continued) 3. Lymph node, biopsy, Left non sentinel - MATURE ADIPOSE TISSUE, NO LYMPHOID TISSUE. - NO EVIDENCE OF MALIGNANCY. 4. 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 - PREVIOUS EXCISIONAL SITE CHANGES. - SCLEROSING PAPILLOMA. - ONE LYMPH NODE, NEGATIVE FOR METASTATIC CARCINOMA (0/1).  IMPRESSION: Patient doing well. Last drain ready to come out  PLAN: Her next visit will be in one month. She has follow up with Dr Odis Luster scheduled.I removed the drain. I gave her a copy of the path and reviewed with her

## 2013-02-24 NOTE — Patient Instructions (Signed)
See me again in a month

## 2013-03-06 ENCOUNTER — Other Ambulatory Visit (INDEPENDENT_AMBULATORY_CARE_PROVIDER_SITE_OTHER): Payer: Self-pay

## 2013-03-06 DIAGNOSIS — C50919 Malignant neoplasm of unspecified site of unspecified female breast: Secondary | ICD-10-CM

## 2013-03-07 ENCOUNTER — Encounter (INDEPENDENT_AMBULATORY_CARE_PROVIDER_SITE_OTHER): Payer: Medicare Other | Admitting: Surgery

## 2013-03-15 ENCOUNTER — Ambulatory Visit: Payer: Medicare Other | Attending: Plastic Surgery | Admitting: Physical Therapy

## 2013-03-15 DIAGNOSIS — M25519 Pain in unspecified shoulder: Secondary | ICD-10-CM | POA: Insufficient documentation

## 2013-03-15 DIAGNOSIS — IMO0001 Reserved for inherently not codable concepts without codable children: Secondary | ICD-10-CM | POA: Insufficient documentation

## 2013-03-15 DIAGNOSIS — C50919 Malignant neoplasm of unspecified site of unspecified female breast: Secondary | ICD-10-CM | POA: Insufficient documentation

## 2013-03-15 DIAGNOSIS — M24519 Contracture, unspecified shoulder: Secondary | ICD-10-CM | POA: Insufficient documentation

## 2013-03-21 ENCOUNTER — Other Ambulatory Visit (INDEPENDENT_AMBULATORY_CARE_PROVIDER_SITE_OTHER): Payer: Self-pay

## 2013-03-21 ENCOUNTER — Telehealth: Payer: Self-pay | Admitting: *Deleted

## 2013-03-21 NOTE — Telephone Encounter (Signed)
Confirmed 03/27/13 appt w/ pt.  Mailed before appt letter & packet to pt.  Emailed Meagan & Dr. Jamey Ripa to make them aware.  Took paperwork to Med [facility] for chart.

## 2013-03-23 ENCOUNTER — Other Ambulatory Visit: Payer: Self-pay | Admitting: *Deleted

## 2013-03-23 ENCOUNTER — Other Ambulatory Visit (INDEPENDENT_AMBULATORY_CARE_PROVIDER_SITE_OTHER): Payer: Self-pay

## 2013-03-23 DIAGNOSIS — C50912 Malignant neoplasm of unspecified site of left female breast: Secondary | ICD-10-CM

## 2013-03-23 DIAGNOSIS — C50919 Malignant neoplasm of unspecified site of unspecified female breast: Secondary | ICD-10-CM

## 2013-03-23 DIAGNOSIS — C50119 Malignant neoplasm of central portion of unspecified female breast: Secondary | ICD-10-CM | POA: Insufficient documentation

## 2013-03-23 DIAGNOSIS — C50112 Malignant neoplasm of central portion of left female breast: Secondary | ICD-10-CM

## 2013-03-24 ENCOUNTER — Encounter (INDEPENDENT_AMBULATORY_CARE_PROVIDER_SITE_OTHER): Payer: Self-pay | Admitting: Surgery

## 2013-03-24 ENCOUNTER — Ambulatory Visit (INDEPENDENT_AMBULATORY_CARE_PROVIDER_SITE_OTHER): Payer: Medicare Other | Admitting: Surgery

## 2013-03-24 VITALS — BP 124/80 | HR 92 | Resp 20 | Ht 62.0 in | Wt 159.0 lb

## 2013-03-24 DIAGNOSIS — Z09 Encounter for follow-up examination after completed treatment for conditions other than malignant neoplasm: Secondary | ICD-10-CM

## 2013-03-24 NOTE — Patient Instructions (Signed)
See me in two months.

## 2013-03-24 NOTE — Progress Notes (Signed)
Ariel Henderson    [id number] 03/24/2013    20-May-1946   CC:   Chief Complaint  Patient presents with  . Routine Post Op    mastectomy     HPI:  The patient returns for post op follow-up. She underwent a left mastectommy/SLN and reconstruction on 02/09/13. Over all she feels that she is doing well. No particular problems  PE: VITAL SIGNS: BP 124/80  Pulse 92  Resp 20  Ht 5\' 2"  (1.575 m)  Wt 159 lb (72.122 kg)  BMI 29.07 kg/m2  Breast: The incision is healing nicely but there is some erythema medially. It appears less than the mark by Dr Odis Luster./ He started her on antibiotics     IMPRESSION: Patient doing well  PLAN: Will see again in two months. She has oncology appointment and Dr Maxcine Ham follow up next week scheduled

## 2013-03-27 ENCOUNTER — Ambulatory Visit (HOSPITAL_BASED_OUTPATIENT_CLINIC_OR_DEPARTMENT_OTHER): Payer: Medicare Other

## 2013-03-27 ENCOUNTER — Telehealth: Payer: Self-pay | Admitting: Oncology

## 2013-03-27 ENCOUNTER — Encounter: Payer: Self-pay | Admitting: Oncology

## 2013-03-27 ENCOUNTER — Ambulatory Visit (HOSPITAL_BASED_OUTPATIENT_CLINIC_OR_DEPARTMENT_OTHER): Payer: Medicare Other | Admitting: Oncology

## 2013-03-27 ENCOUNTER — Other Ambulatory Visit (HOSPITAL_BASED_OUTPATIENT_CLINIC_OR_DEPARTMENT_OTHER): Payer: Medicare Other | Admitting: Lab

## 2013-03-27 VITALS — BP 110/71 | HR 86 | Temp 97.9°F | Resp 20 | Ht 62.0 in | Wt 162.0 lb

## 2013-03-27 DIAGNOSIS — D0590 Unspecified type of carcinoma in situ of unspecified breast: Secondary | ICD-10-CM

## 2013-03-27 DIAGNOSIS — Z171 Estrogen receptor negative status [ER-]: Secondary | ICD-10-CM

## 2013-03-27 DIAGNOSIS — C50119 Malignant neoplasm of central portion of unspecified female breast: Secondary | ICD-10-CM

## 2013-03-27 DIAGNOSIS — C50112 Malignant neoplasm of central portion of left female breast: Secondary | ICD-10-CM

## 2013-03-27 DIAGNOSIS — F411 Generalized anxiety disorder: Secondary | ICD-10-CM

## 2013-03-27 LAB — CBC WITH DIFFERENTIAL/PLATELET
Basophils Absolute: 0.1 10*3/uL (ref 0.0–0.1)
EOS%: 2.2 % (ref 0.0–7.0)
Eosinophils Absolute: 0.3 10*3/uL (ref 0.0–0.5)
HGB: 12.2 g/dL (ref 11.6–15.9)
MCH: 27.8 pg (ref 25.1–34.0)
NEUT#: 8.8 10*3/uL — ABNORMAL HIGH (ref 1.5–6.5)
RBC: 4.39 10*6/uL (ref 3.70–5.45)
RDW: 15 % — ABNORMAL HIGH (ref 11.2–14.5)
lymph#: 4.4 10*3/uL — ABNORMAL HIGH (ref 0.9–3.3)
nRBC: 0 % (ref 0–0)

## 2013-03-27 LAB — COMPREHENSIVE METABOLIC PANEL (CC13)
ALT: 14 U/L (ref 0–55)
Albumin: 3.4 g/dL — ABNORMAL LOW (ref 3.5–5.0)
Alkaline Phosphatase: 97 U/L (ref 40–150)
Glucose: 111 mg/dl (ref 70–140)
Potassium: 4.2 mEq/L (ref 3.5–5.1)
Sodium: 138 mEq/L (ref 136–145)
Total Protein: 7.4 g/dL (ref 6.4–8.3)

## 2013-03-27 NOTE — Progress Notes (Signed)
Checked in new patient. No financial issues. Didn't ask about poa/living will. °

## 2013-03-27 NOTE — Patient Instructions (Addendum)
#  1 we reviewed your pathology and diagnosis in detail today as well as your history.  #2 you had a noninvasive breast cancer (DCIS). You have had a mastectomy. The pathology showed a DCIS that was estrogen receptor and progesterone receptor negative.  #3 we discussed chemoprevention for the contralateral breast. This would consist of tamoxifen. We discussed the side effects. However tamoxifen would not prevent estrogen receptor negative disease.  #4 after an extensive discussion we opted not to begin you on tamoxifen.  #5 we reviewed she her family history. There is significant history of cancers in your family both your nuclear family as well as your extended family. I did recommend proceeding with genetic counseling. I have made a referral to our genetic counselor.  #6 I will plan on seeing you back in 6 months time for followup.

## 2013-03-27 NOTE — Telephone Encounter (Signed)
gv pt appt schedule for September 2014 and January 2015 appts.

## 2013-03-28 ENCOUNTER — Ambulatory Visit: Payer: Medicare Other

## 2013-04-06 ENCOUNTER — Encounter: Payer: Medicare Other | Admitting: Physical Therapy

## 2013-04-12 ENCOUNTER — Encounter: Payer: Self-pay | Admitting: Radiation Oncology

## 2013-04-13 ENCOUNTER — Ambulatory Visit: Admission: RE | Admit: 2013-04-13 | Payer: Medicare Other | Source: Ambulatory Visit

## 2013-04-14 ENCOUNTER — Ambulatory Visit: Payer: Medicare Other

## 2013-04-14 ENCOUNTER — Ambulatory Visit: Admission: RE | Admit: 2013-04-14 | Payer: Medicare Other | Source: Ambulatory Visit | Admitting: Radiation Oncology

## 2013-04-14 HISTORY — DX: Malignant neoplasm of unspecified site of unspecified female breast: C50.919

## 2013-04-18 ENCOUNTER — Telehealth: Payer: Self-pay | Admitting: *Deleted

## 2013-04-19 NOTE — Progress Notes (Signed)
Ariel Henderson [id number] 1946/06/01 67 y.o. 04/19/2013 11:48 PM  CC  Georgann Housekeeper, MD [address] [place] [address] Dr. Cyndia Bent REASON FOR CONSULTATION:  67 year old female with new diagnosis of DCIS of the left breast status post lumpectomy. Patient is seen in medical oncology for discussion of treatment options  STAGE:   Breast cancer, left breast   Primary site: Breast (Left)   Staging method: AJCC 7th Edition   Pathologic: Stage 0 (Tis, N0, cM0) signed by Currie Paris, MD on 02/24/2013  9:03 AM   Summary: Stage 0 (Tis, N0, cM0)  REFERRING PHYSICIAN: Dr. Cyndia Bent  HISTORY OF PRESENT ILLNESS:  Ariel Henderson is a 67 y.o. female.  Who has had a nipple discharge for quite some time. She had a mammogram and ductogram performed. There was noted to be a small filling defect noted on the ductogram and a surgical evaluation was performed. Patient does have a prior history of breast biopsies dating back to 62.patient was seen by Dr. Cyndia Bent. He recommended a ductal excision and surgical biopsy of the area.this was performed and the pathology report showed extensive DCIS with positive margins.patient was recommended different alternatives. However patient wanted a total mastectomy she was also interested in reconstruction.she underwent a mastectomy on 02/09/2013. She had a left mastectomy with sentinel lymph node biopsy and reconstruction. She continued to do well without any problems afterwards. She is now seen in medical oncology for discussion of adjuvant treatment options.   Past Medical History: Past Medical History  Diagnosis Date  . GERD (gastroesophageal reflux disease)   . Migraine headache   . Chronic anxiety   . Major depression   . Dyslipidemia   . Fibrocystic breast disease   . Complication of anesthesia     "slow to wake up"  . Hypertension   . Seasonal allergies   . Peripheral vascular disease     varicose  veins  . Hypothyroidism   . Multinodular goiter   . Thyroid cancer     per patient on 02/01/2013; dx in 30s  . Pneumonia     hx  . History of recurrent UTIs   . Cancer     cervical per patient  . Melanoma     per patient on 02/01/13; located on back; dx in 40s  . Breast cancer 12/29/12 bx    left breast ductal,high grade ductal ca in situ w/asoc comedo necrosis    Past Surgical History: Past Surgical History  Procedure Laterality Date  . Wrist fracture surgery    . Thyroidectomy  1991  . Breast lumpectomy      left   x 3, right x 2  . Abdominal hysterectomy    . Eye surgery Bilateral     cataract extraction with IOL  . Breast ductal system excision Left 12/29/2012    Procedure: EXCISION DUCTAL SYSTEM BREAST;  Surgeon: Currie Paris, MD;  Location: [facility];  Service: General;  Laterality: Left;  . Total mastectomy Left 02/09/2013    WITH TISSUE EXPANDER & BIOPSY  . Mastectomy w/ sentinel node biopsy Left 02/09/2013    Procedure: TOTAL MASTECTOMY WITH SENTINEL LYMPH NODE BIOPSY;  Surgeon: Currie Paris, MD;  Location: [facility];  Service: General;  Laterality: Left;  . Breast reconstruction with placement of tissue expander and flex hd (acellular hydrated dermis) Left 02/09/2013    Procedure: LEFT BREAST RECONSTRUCTION WITH PLACEMENT OF TISSUE EXPANDER ;  Surgeon: Etter Sjogren, MD;  Location: [facility];  Service: Plastics;  Laterality: Left;  . Breast lumpectomy Left 12/2009    lower inner=fibrocystic changes/sclerosing adenosis    Family History: Family History  Problem Relation Age of Onset  . Colon cancer Brother 45  . Prostate cancer Brother 50  . Lung cancer Father 24    smoker  . Ovarian cancer Sister 26  . Brain cancer Sister 44  . Prostate cancer Brother 53  . Prostate cancer Brother 1  . Prostate cancer Brother 24  . Prostate cancer Brother 62  . Cancer Brother 48    metastatic; unknown primary    Social History History  Substance Use Topics  . Smoking  status: Never Smoker   . Smokeless tobacco: Never Used  . Alcohol Use: No    Allergies: Allergies  Allergen Reactions  . Aspirin Nausea Only    Non coated CAN TAKE 81 MG     Current Medications: Current Outpatient Prescriptions  Medication Sig Dispense Refill  . acetaminophen (TYLENOL) 500 MG tablet Take 500 mg by mouth every 6 (six) hours as needed for pain.      Marland Kitchen almotriptan (AXERT) 12.5 MG tablet Take 12.5 mg by mouth daily as needed for migraine. may repeat in 2 hours if needed      . aspirin 81 MG tablet Take 81 mg by mouth daily.      . cephALEXin (KEFLEX) 500 MG capsule Take 1 capsule (500 mg total) by mouth every 12 (twelve) hours.  20 capsule  0  . cholecalciferol (VITAMIN D) 1000 UNITS tablet Take 1,000 Units by mouth daily.      . diazepam (VALIUM) 10 MG tablet Take 10 mg by mouth every 6 (six) hours as needed for anxiety.      Marland Kitchen estradiol (ESTRACE) 1 MG tablet Take 1 mg by mouth daily.      Marland Kitchen levothyroxine (SYNTHROID, LEVOTHROID) 137 MCG tablet       . lovastatin (MEVACOR) 20 MG tablet Take 20 mg by mouth at bedtime.      . methocarbamol (ROBAXIN) 500 MG tablet Take 1 tablet (500 mg total) by mouth every 6 (six) hours as needed.  30 tablet  1  . omeprazole (PRILOSEC) 40 MG capsule       . propranolol (INDERAL) 40 MG tablet Take 40 mg by mouth daily.      . ranitidine (ZANTAC) 75 MG tablet Take 75 mg by mouth 2 (two) times daily.      Marland Kitchen venlafaxine (EFFEXOR) 75 MG tablet Take 75 mg by mouth 2 (two) times daily.      . vitamin E 100 UNIT capsule Take 100 Units by mouth daily.       No current facility-administered medications for this visit.    OB/GYN History:patient had menarche at age 67 she underwent menopause in her early 10s she said 1 live birth 55 no hormone replacement therapy  Fertility Discussion: not applicable Prior History of Cancer: history of prior breasts are trace and biopsies  Health Maintenance:  Colonoscopy yes Bone Density yes Last PAP smear  yes  ECOG PERFORMANCE STATUS: 1 - Symptomatic but completely ambulatory  Genetic Counseling/testing: no  REVIEW OF SYSTEMS:  A comprehensive review of systems was negative.  PHYSICAL EXAMINATION: Blood pressure 110/71, pulse 86, temperature 97.9 F (36.6 C), temperature source Oral, resp. rate 20, height 5\' 2"  (1.575 m), weight 162 lb (73.483 kg).  NUU:VOZDG SKIN: skin color, texture, turgor are normal HEAD: Normocephalic EYES: PERRLA, EOMI EARS:  External ears normal OROPHARYNX:no exudate, no erythema and lips, buccal mucosa, and tongue normal  NECK: supple, no adenopathy LYMPH:  no palpable lymphadenopathy BREAST:surgical scars noted well healed reconstruction appear LUNGS: clear to auscultation  HEART: regular rate & rhythm ABDOMEN:abdomen soft and normal bowel sounds BACK: No CVA tenderness EXTREMITIES:no edema  NEURO: alert & oriented x 3 with fluent speech, no focal motor/sensory deficits     STUDIES/RESULTS: No results found.   LABS:    Chemistry      Component Value Date/Time   NA 138 03/27/2013 1442   NA 139 02/07/2013 1258   K 4.2 03/27/2013 1442   K 5.0 02/07/2013 1258   CL 105 02/07/2013 1258   CO2 24 03/27/2013 1442   CO2 27 02/07/2013 1258   BUN 14.8 03/27/2013 1442   BUN 15 02/07/2013 1258   CREATININE 1.0 03/27/2013 1442   CREATININE 0.67 02/07/2013 1258      Component Value Date/Time   CALCIUM 9.2 03/27/2013 1442   CALCIUM 9.3 02/07/2013 1258   ALKPHOS 97 03/27/2013 1442   AST 15 03/27/2013 1442   ALT 14 03/27/2013 1442   BILITOT <0.20 Repeated and Verified 03/27/2013 1442      Lab Results  Component Value Date   WBC 14.7 confirmed* 03/27/2013   HGB 12.2 03/27/2013   HCT 38.2 03/27/2013   MCV 87.0 03/27/2013   PLT 709 confirmed* 03/27/2013    PATHOLOGY: Pathology report:  Diagnosis  1. Lymph node, sentinel, biopsy, Left #1  - ONE LYMPH NODE, NEGATIVE FOR METASTATIC CARCINOMA (0/1).  2. Lymph node, sentinel, biopsy, Left #2  - ONE LYMPH NODE, NEGATIVE  FOR METASTATIC CARCINOMA (0/1).  1 of 4  FINAL for EURA, [place] [phone])  Diagnosis(continued)  3. Lymph node, biopsy, Left non sentinel  - MATURE ADIPOSE TISSUE, NO LYMPHOID TISSUE.  - NO EVIDENCE OF MALIGNANCY.  4. Breast, simple mastectomy, Left  - HIGH GRADE DUCTAL CARCINOMA IN SITU WITH ASSOCIATED COMEDO NECROSIS, AT LEAST  4.0 CM.  - PREVIOUS EXCISIONAL SITE CHANGES.  - SCLEROSING PAPILLOMA.  - ONE LYMPH NODE, NEGATIVE FOR METASTATIC CARCINOMA (0/1).  ASSESSMENT   67 year old female with DCIS of the left breast patient is status post mastectomy with the final but the feeling a DCIS that was ER/PR negative. Postoperatively patient is doing well. He had an extensive discussion regarding pathology and diagnosis in detail as well as pathophysiology and staging and treatment of breast cancer. She understands that she had a noninvasive ER negative disease.   We discussed chemoprevention for the contralateral breast. This would consist of tamoxifen were discussed side effects however her tamoxifen would not prevent an estrogen receptor negative disease. After extensive discussions patient opted not to begin tamoxifen.  We also reviewed the patient's family history there is significant family history of cancers in both patient's nuclear family as well as extended family. I did recommend that she proceed with genetic counseling and I made a referral.   Clinical Trial Eligibility: no Multidisciplinary conference discussion yes     PLAN:    #1 observation.  #2 referral to genetic counseling.  #3 I will see her back in 6 months time for followup        Discussion: Patient is being treated per NCCN breast cancer care guidelines appropriate for stage.0   Thank you so much for allowing me to participate in the care of Coren R Laura. I will continue to follow up the patient with you and assist in her care.  All questions were answered. The patient knows to call the clinic  with any problems, questions or concerns. We can certainly see the patient much sooner if necessary.  I spent 30 minutes counseling the patient face to face. The total time spent in the appointment was 40 minutes.  Drue Second, MD Medical/Oncology [facility] [phone] (beeper) [phone] (Office)

## 2013-04-21 ENCOUNTER — Encounter: Payer: Self-pay | Admitting: *Deleted

## 2013-04-21 NOTE — Progress Notes (Signed)
Mailed after appt letter to pt. 

## 2013-05-15 ENCOUNTER — Encounter: Payer: Medicare Other | Admitting: Genetic Counselor

## 2013-05-15 ENCOUNTER — Other Ambulatory Visit: Payer: Medicare Other | Admitting: Lab

## 2013-05-30 ENCOUNTER — Ambulatory Visit (INDEPENDENT_AMBULATORY_CARE_PROVIDER_SITE_OTHER): Payer: Medicare Other | Admitting: Surgery

## 2013-05-30 ENCOUNTER — Encounter (INDEPENDENT_AMBULATORY_CARE_PROVIDER_SITE_OTHER): Payer: Self-pay | Admitting: Surgery

## 2013-05-30 VITALS — BP 124/78 | HR 87 | Temp 96.8°F | Ht 62.0 in | Wt 166.4 lb

## 2013-05-30 DIAGNOSIS — Z09 Encounter for follow-up examination after completed treatment for conditions other than malignant neoplasm: Secondary | ICD-10-CM

## 2013-05-30 NOTE — Patient Instructions (Signed)
Come back to see [place] about a month after your reconstruction is final

## 2013-05-30 NOTE — Progress Notes (Signed)
Ariel Henderson    [id number] 05/30/2013    [birth year]   CC:   Chief Complaint  Patient presents with  . Routine Post Op    reck masty     HPI:  The patient returns for post op follow-up. She underwent a left mastectommy/SLN and reconstruction on 02/09/13. Over all she feels that she is doing well. No particular problems  PE: VITAL SIGNS: BP 124/78  Pulse 87  Temp(Src) 96.8 F (36 C) (Temporal)  Ht 5\' 2"  (1.575 m)  Wt 166 lb 6.4 oz (75.479 kg)  BMI 30.43 kg/m2  SpO2 98%  Breast:Healed incsions and starting to be expanded    IMPRESSION: Patient doing well  PLAN: Will see again after her final reconstructive surgery so we can get a good baseline exam

## 2013-09-28 ENCOUNTER — Telehealth: Payer: Self-pay | Admitting: Oncology

## 2013-09-28 ENCOUNTER — Encounter: Payer: Self-pay | Admitting: Oncology

## 2013-09-28 ENCOUNTER — Ambulatory Visit (HOSPITAL_BASED_OUTPATIENT_CLINIC_OR_DEPARTMENT_OTHER): Payer: Medicare Other | Admitting: Oncology

## 2013-09-28 VITALS — BP 128/75 | HR 56 | Temp 97.7°F | Resp 18 | Ht 62.0 in | Wt 175.3 lb

## 2013-09-28 DIAGNOSIS — D473 Essential (hemorrhagic) thrombocythemia: Secondary | ICD-10-CM

## 2013-09-28 DIAGNOSIS — C50912 Malignant neoplasm of unspecified site of left female breast: Secondary | ICD-10-CM

## 2013-09-28 DIAGNOSIS — D471 Chronic myeloproliferative disease: Secondary | ICD-10-CM

## 2013-09-28 DIAGNOSIS — D72829 Elevated white blood cell count, unspecified: Secondary | ICD-10-CM

## 2013-09-28 DIAGNOSIS — D0590 Unspecified type of carcinoma in situ of unspecified breast: Secondary | ICD-10-CM

## 2013-09-28 DIAGNOSIS — D75839 Thrombocytosis, unspecified: Secondary | ICD-10-CM

## 2013-09-28 MED ORDER — TAMOXIFEN CITRATE 20 MG PO TABS: 20.0000 mg | ORAL_TABLET | Freq: Every day | ORAL | Status: AC

## 2013-09-28 MED ORDER — ALPRAZOLAM 0.25 MG PO TABS: 0.2500 mg | ORAL_TABLET | Freq: Every evening | ORAL | Status: DC | PRN

## 2013-09-28 NOTE — Patient Instructions (Signed)

## 2013-10-05 ENCOUNTER — Telehealth: Payer: Self-pay | Admitting: Oncology

## 2013-10-05 NOTE — Progress Notes (Signed)
OFFICE PROGRESS NOTE  CC  HUSAIN,KARRAR, MD [address], [address] [place] [address] Dr. Neldon Mc Dr. Thea Silversmith  DIAGNOSIS: 68 year old female with DCIS of the left breast diagnosed June 2014.  PRIOR THERAPY:  #1 patient presented with nipple discharge and had a mammogram and ductal ground performed. She was noted to have a small filling defect on the ductogram. She underwent surgical evaluation. Patient does have a prior history of biopsies of the breast dating back to 63.  #2 patient was seen by Dr. Neldon Mc she underwent a ductal excision and surgical biopsy of the area. The pathology revealed extensive DCIS with positive margins. She subsequently had a total mastectomy with reconstruction. The mastectomy was performed on 02/09/2013. Sentinel nodes were negative.  #3 patient was seen in medical oncology for adjuvant treatment options. She was seen in July 2014. She was begun on tamoxifen 20 mg daily. Total of 5 years is planned  CURRENT THERAPY: Tamoxifen 20 mg daily since July 2014  INTERVAL HISTORY: Ariel Henderson 68 y.o. female returns for followup visit today. Overall she is doing well. She's tolerating tamoxifen very nicely. She is fully active with a performance status of 0. She denies any new issues. She is not taking any new medications other than which are listed. She denies any shortness of breath chest pains palpitations she denies having any pain no nausea or vomiting no fevers chills or night sweats. She does have some hot flashes. Remainder of the 10 point review of systems is negative.  MEDICAL HISTORY: Past Medical History  Diagnosis Date  . GERD (gastroesophageal reflux disease)   . Migraine headache   . Chronic anxiety   . Major depression   . Dyslipidemia   . Fibrocystic breast disease   . Complication of anesthesia     "slow to wake up"  . Hypertension   . Seasonal allergies   . Peripheral vascular disease      varicose veins  . Hypothyroidism   . Multinodular goiter   . Thyroid cancer     per patient on 02/01/2013; dx in 33s  . Pneumonia     hx  . History of recurrent UTIs   . Cancer     cervical per patient  . Melanoma     per patient on 02/01/13; located on back; dx in 9s  . Breast cancer 12/29/12 bx    left breast ductal,high grade ductal ca in situ w/asoc comedo necrosis    ALLERGIES:  is allergic to aspirin.  MEDICATIONS:  Current Outpatient Prescriptions  Medication Sig Dispense Refill  . acetaminophen (TYLENOL) 500 MG tablet Take 500 mg by mouth every 6 (six) hours as needed for pain.      Marland Kitchen almotriptan (AXERT) 12.5 MG tablet Take 12.5 mg by mouth daily as needed for migraine. may repeat in 2 hours if needed      . aspirin 81 MG tablet Take 81 mg by mouth daily.      . cholecalciferol (VITAMIN D) 1000 UNITS tablet Take 1,000 Units by mouth daily.      . diazepam (VALIUM) 10 MG tablet Take 10 mg by mouth every 6 (six) hours as needed for anxiety.      Marland Kitchen levothyroxine (SYNTHROID, LEVOTHROID) 137 MCG tablet       . lovastatin (MEVACOR) 20 MG tablet Take 20 mg by mouth at bedtime.      . methocarbamol (ROBAXIN) 500 MG tablet Take 1 tablet (500 mg total) by  mouth every 6 (six) hours as needed.  30 tablet  1  . omeprazole (PRILOSEC) 40 MG capsule       . propranolol (INDERAL) 40 MG tablet Take 40 mg by mouth daily.      . ranitidine (ZANTAC) 75 MG tablet Take 75 mg by mouth 2 (two) times daily.      Marland Kitchen venlafaxine (EFFEXOR) 75 MG tablet Take 75 mg by mouth 2 (two) times daily.      . vitamin E 100 UNIT capsule Take 100 Units by mouth daily.      Marland Kitchen ALPRAZolam (XANAX) 0.25 MG tablet Take 1 tablet (0.25 mg total) by mouth at bedtime as needed for anxiety.  30 tablet  0  . tamoxifen (NOLVADEX) 20 MG tablet Take 1 tablet (20 mg total) by mouth daily.  90 tablet  12   No current facility-administered medications for this visit.    SURGICAL HISTORY:  Past Surgical History  Procedure  Laterality Date  . Wrist fracture surgery    . Thyroidectomy  1991  . Breast lumpectomy      left   x 3, right x 2  . Abdominal hysterectomy    . Eye surgery Bilateral     cataract extraction with IOL  . Breast ductal system excision Left 12/29/2012    Procedure: EXCISION DUCTAL SYSTEM BREAST;  Surgeon: Haywood Lasso, MD;  Location: [facility];  Service: General;  Laterality: Left;  . Total mastectomy Left 02/09/2013    WITH TISSUE EXPANDER & BIOPSY  . Mastectomy w/ sentinel node biopsy Left 02/09/2013    Procedure: TOTAL MASTECTOMY WITH SENTINEL LYMPH NODE BIOPSY;  Surgeon: Haywood Lasso, MD;  Location: [facility];  Service: General;  Laterality: Left;  . Breast reconstruction with placement of tissue expander and flex hd (acellular hydrated dermis) Left 02/09/2013    Procedure: LEFT BREAST RECONSTRUCTION WITH PLACEMENT OF TISSUE EXPANDER ;  Surgeon: Crissie Reese, MD;  Location: [facility];  Service: Plastics;  Laterality: Left;  . Breast lumpectomy Left 12/2009    lower inner=fibrocystic changes/sclerosing adenosis    REVIEW OF SYSTEMS:  Pertinent items are noted in HPI.     PHYSICAL EXAMINATION: Blood pressure 128/75, pulse 56, temperature 97.7 F (36.5 C), temperature source Oral, resp. rate 18, height 5\' 2"  (1.575 m), weight 175 lb 4.8 oz (79.516 kg). Body mass index is 32.05 kg/(m^2). ECOG PERFORMANCE STATUS: 0 - Asymptomatic   General appearance: alert, cooperative and appears stated age Lymph nodes: Cervical, supraclavicular, and axillary nodes normal. Resp: clear to auscultation bilaterally Back: symmetric, no curvature. ROM normal. No CVA tenderness. Cardio: regular rate and rhythm GI: soft, non-tender; bowel sounds normal; no masses,  no organomegaly Extremities: extremities normal, atraumatic, no cyanosis or edema Neurologic: Grossly normal Right breast: No masses nipple discharge.  LABORATORY DATA: Lab Results  Component Value Date   WBC 14.7 confirmed* 03/27/2013   HGB  12.2 03/27/2013   HCT 38.2 03/27/2013   MCV 87.0 03/27/2013   PLT 709 confirmed* 03/27/2013      Chemistry      Component Value Date/Time   NA 138 03/27/2013 1442   NA 139 02/07/2013 1258   K 4.2 03/27/2013 1442   K 5.0 02/07/2013 1258   CL 105 02/07/2013 1258   CO2 24 03/27/2013 1442   CO2 27 02/07/2013 1258   BUN 14.8 03/27/2013 1442   BUN 15 02/07/2013 1258   CREATININE 1.0 03/27/2013 1442   CREATININE 0.67 02/07/2013 1258  Component Value Date/Time   CALCIUM 9.2 03/27/2013 1442   CALCIUM 9.3 02/07/2013 1258   ALKPHOS 97 03/27/2013 1442   AST 15 03/27/2013 1442   ALT 14 03/27/2013 1442   BILITOT <0.20 Repeated and Verified 03/27/2013 1442       RADIOGRAPHIC STUDIES:  No results found.  ASSESSMENT/PLAN: 68 year old female with  #1 DCIS of the left breast diagnosed June 2014: Status post mastectomy with reconstruction. Subsequently begun on antiestrogen therapy with tamoxifen 20 mg daily starting in July 2014. Overall patient is tolerating it well. She is without any evidence of recurrent disease.  #2 thrombocytosis/leukocytosis: Patient may possibly have any underlying myeloproliferative disorder  This was discovered back in July 2014. She has not had any further workup for this. She did not have any blood work done today. I will plan on having her get a CBC iron studies as well as review of her peripheral smear.  #3 followup: Patient will be seen back in 3 months time as well as she'll be called to have her blood work performed in the next few days.      All questions were answered. The patient knows to call the clinic with any problems, questions or concerns. We can certainly see the patient much sooner if necessary.  I spent 20 minutes counseling the patient face to face. The total time spent in the appointment was 30 minutes.    Marcy Panning, MD Medical/Oncology [facility] [phone] (beeper) [phone] (Office)  10/05/2013, 8:28 AM

## 2013-10-05 NOTE — Telephone Encounter (Signed)
lmonvm advising the pt of her lab appt on 10/11/2013 @10 :45am

## 2013-10-06 ENCOUNTER — Other Ambulatory Visit: Payer: Self-pay | Admitting: Emergency Medicine

## 2013-10-11 ENCOUNTER — Other Ambulatory Visit (HOSPITAL_BASED_OUTPATIENT_CLINIC_OR_DEPARTMENT_OTHER): Payer: Medicare Other

## 2013-10-11 DIAGNOSIS — D473 Essential (hemorrhagic) thrombocythemia: Secondary | ICD-10-CM

## 2013-10-11 DIAGNOSIS — D0590 Unspecified type of carcinoma in situ of unspecified breast: Secondary | ICD-10-CM

## 2013-10-11 DIAGNOSIS — D72829 Elevated white blood cell count, unspecified: Secondary | ICD-10-CM

## 2013-10-11 DIAGNOSIS — C50912 Malignant neoplasm of unspecified site of left female breast: Secondary | ICD-10-CM

## 2013-10-11 DIAGNOSIS — D471 Chronic myeloproliferative disease: Secondary | ICD-10-CM

## 2013-10-11 LAB — LACTATE DEHYDROGENASE (CC13): LDH: 199 U/L (ref 125–245)

## 2013-10-11 LAB — COMPREHENSIVE METABOLIC PANEL (CC13)
ALK PHOS: 67 U/L (ref 40–150)
ALT: 16 U/L (ref 0–55)
AST: 15 U/L (ref 5–34)
Albumin: 3.8 g/dL (ref 3.5–5.0)
Anion Gap: 9 mEq/L (ref 3–11)
BUN: 15.8 mg/dL (ref 7.0–26.0)
CO2: 26 mEq/L (ref 22–29)
CREATININE: 0.8 mg/dL (ref 0.6–1.1)
Calcium: 9.5 mg/dL (ref 8.4–10.4)
Chloride: 108 mEq/L (ref 98–109)
Glucose: 102 mg/dl (ref 70–140)
POTASSIUM: 3.7 meq/L (ref 3.5–5.1)
Sodium: 143 mEq/L (ref 136–145)
Total Bilirubin: 0.21 mg/dL (ref 0.20–1.20)
Total Protein: 6.9 g/dL (ref 6.4–8.3)

## 2013-10-11 LAB — CBC WITH DIFFERENTIAL/PLATELET
BASO%: 1 % (ref 0.0–2.0)
Basophils Absolute: 0.1 10*3/uL (ref 0.0–0.1)
EOS%: 3.5 % (ref 0.0–7.0)
Eosinophils Absolute: 0.3 10*3/uL (ref 0.0–0.5)
HCT: 36.4 % (ref 34.8–46.6)
HGB: 12 g/dL (ref 11.6–15.9)
LYMPH%: 42.1 % (ref 14.0–49.7)
MCH: 28.6 pg (ref 25.1–34.0)
MCHC: 32.9 g/dL (ref 31.5–36.0)
MCV: 87.1 fL (ref 79.5–101.0)
MONO#: 0.6 10*3/uL (ref 0.1–0.9)
MONO%: 6.5 % (ref 0.0–14.0)
NEUT#: 4 10*3/uL (ref 1.5–6.5)
NEUT%: 46.9 % (ref 38.4–76.8)
Platelets: 249 10*3/uL (ref 145–400)
RBC: 4.18 10*6/uL (ref 3.70–5.45)
RDW: 15.1 % — AB (ref 11.2–14.5)
WBC: 8.5 10*3/uL (ref 3.9–10.3)
lymph#: 3.6 10*3/uL — ABNORMAL HIGH (ref 0.9–3.3)

## 2013-10-11 LAB — IRON AND TIBC CHCC
%SAT: 24 % (ref 21–57)
Iron: 67 ug/dL (ref 41–142)
TIBC: 276 ug/dL (ref 236–444)
UIBC: 209 ug/dL (ref 120–384)

## 2013-10-11 LAB — FERRITIN CHCC: FERRITIN: 24 ng/mL (ref 9–269)

## 2013-10-20 ENCOUNTER — Other Ambulatory Visit: Payer: Self-pay | Admitting: Plastic Surgery

## 2013-10-20 DIAGNOSIS — Z01818 Encounter for other preprocedural examination: Secondary | ICD-10-CM

## 2013-10-25 ENCOUNTER — Other Ambulatory Visit: Payer: Self-pay | Admitting: Internal Medicine

## 2013-10-25 ENCOUNTER — Other Ambulatory Visit: Payer: Self-pay | Admitting: Obstetrics and Gynecology

## 2013-10-25 ENCOUNTER — Other Ambulatory Visit: Payer: Self-pay

## 2013-10-25 ENCOUNTER — Other Ambulatory Visit: Payer: Self-pay | Admitting: Plastic Surgery

## 2013-10-25 DIAGNOSIS — Z01818 Encounter for other preprocedural examination: Secondary | ICD-10-CM

## 2013-10-31 ENCOUNTER — Ambulatory Visit
Admission: RE | Admit: 2013-10-31 | Discharge: 2013-10-31 | Disposition: A | Discharge status: Home / Self Care | Payer: Medicare Other | Source: Ambulatory Visit | Attending: Plastic Surgery | Admitting: Plastic Surgery

## 2013-10-31 DIAGNOSIS — Z01818 Encounter for other preprocedural examination: Secondary | ICD-10-CM

## 2014-01-09 ENCOUNTER — Telehealth: Payer: Self-pay | Admitting: Oncology

## 2014-01-09 NOTE — Telephone Encounter (Signed)
, °

## 2014-01-17 ENCOUNTER — Other Ambulatory Visit: Payer: Medicare Other

## 2014-01-17 ENCOUNTER — Ambulatory Visit: Payer: Medicare Other | Admitting: Oncology

## 2014-02-01 ENCOUNTER — Telehealth: Payer: Self-pay | Admitting: Adult Health

## 2014-02-01 NOTE — Telephone Encounter (Signed)
per pt needed to r/s appt could not make 6/12-r/s w/LC-gave pt copy of  sch

## 2014-02-02 ENCOUNTER — Telehealth: Payer: Self-pay | Admitting: *Deleted

## 2014-02-02 NOTE — Telephone Encounter (Signed)
Called pt with f/u appt.  Confirmed appt with Mendel Ryder on 02/12/14.  Pt denies further needs at this time. Contact information given.

## 2014-02-07 ENCOUNTER — Other Ambulatory Visit: Payer: Self-pay

## 2014-02-07 ENCOUNTER — Telehealth: Payer: Self-pay | Admitting: Adult Health

## 2014-02-07 NOTE — Telephone Encounter (Signed)
, °

## 2014-02-12 ENCOUNTER — Telehealth: Payer: Self-pay | Admitting: Oncology

## 2014-02-12 ENCOUNTER — Ambulatory Visit (HOSPITAL_BASED_OUTPATIENT_CLINIC_OR_DEPARTMENT_OTHER): Payer: Medicare Other | Admitting: Oncology

## 2014-02-12 ENCOUNTER — Other Ambulatory Visit: Payer: Medicare Other

## 2014-02-12 ENCOUNTER — Other Ambulatory Visit (HOSPITAL_BASED_OUTPATIENT_CLINIC_OR_DEPARTMENT_OTHER): Payer: Medicare Other

## 2014-02-12 ENCOUNTER — Encounter: Payer: Self-pay | Admitting: Oncology

## 2014-02-12 ENCOUNTER — Ambulatory Visit: Payer: Medicare Other | Admitting: Adult Health

## 2014-02-12 VITALS — BP 125/70 | HR 64 | Temp 97.4°F | Resp 17 | Ht 62.0 in | Wt 174.1 lb

## 2014-02-12 DIAGNOSIS — C50912 Malignant neoplasm of unspecified site of left female breast: Secondary | ICD-10-CM

## 2014-02-12 DIAGNOSIS — F419 Anxiety disorder, unspecified: Secondary | ICD-10-CM | POA: Insufficient documentation

## 2014-02-12 DIAGNOSIS — D0590 Unspecified type of carcinoma in situ of unspecified breast: Secondary | ICD-10-CM

## 2014-02-12 DIAGNOSIS — F320 Major depressive disorder, single episode, mild: Secondary | ICD-10-CM | POA: Insufficient documentation

## 2014-02-12 DIAGNOSIS — F329 Major depressive disorder, single episode, unspecified: Secondary | ICD-10-CM

## 2014-02-12 DIAGNOSIS — F325 Major depressive disorder, single episode, in full remission: Secondary | ICD-10-CM | POA: Insufficient documentation

## 2014-02-12 DIAGNOSIS — F411 Generalized anxiety disorder: Secondary | ICD-10-CM

## 2014-02-12 LAB — COMPREHENSIVE METABOLIC PANEL (CC13)
ALBUMIN: 3.6 g/dL (ref 3.5–5.0)
ALK PHOS: 49 U/L (ref 40–150)
ALT: 29 U/L (ref 0–55)
AST: 26 U/L (ref 5–34)
Anion Gap: 8 mEq/L (ref 3–11)
BUN: 15.8 mg/dL (ref 7.0–26.0)
CALCIUM: 8.9 mg/dL (ref 8.4–10.4)
CHLORIDE: 109 meq/L (ref 98–109)
CO2: 26 mEq/L (ref 22–29)
Creatinine: 0.8 mg/dL (ref 0.6–1.1)
Glucose: 105 mg/dl (ref 70–140)
POTASSIUM: 4.5 meq/L (ref 3.5–5.1)
Sodium: 142 mEq/L (ref 136–145)
Total Bilirubin: 0.35 mg/dL (ref 0.20–1.20)
Total Protein: 6.7 g/dL (ref 6.4–8.3)

## 2014-02-12 LAB — CBC WITH DIFFERENTIAL/PLATELET
BASO%: 0.9 % (ref 0.0–2.0)
Basophils Absolute: 0.1 10*3/uL (ref 0.0–0.1)
EOS%: 2.2 % (ref 0.0–7.0)
Eosinophils Absolute: 0.2 10*3/uL (ref 0.0–0.5)
HCT: 37.1 % (ref 34.8–46.6)
HGB: 11.9 g/dL (ref 11.6–15.9)
LYMPH%: 36.7 % (ref 14.0–49.7)
MCH: 28.4 pg (ref 25.1–34.0)
MCHC: 32 g/dL (ref 31.5–36.0)
MCV: 88.8 fL (ref 79.5–101.0)
MONO#: 0.8 10*3/uL (ref 0.1–0.9)
MONO%: 8.2 % (ref 0.0–14.0)
NEUT#: 4.9 10*3/uL (ref 1.5–6.5)
NEUT%: 52 % (ref 38.4–76.8)
Platelets: 266 10*3/uL (ref 145–400)
RBC: 4.18 10*6/uL (ref 3.70–5.45)
RDW: 14.8 % — AB (ref 11.2–14.5)
WBC: 9.4 10*3/uL (ref 3.9–10.3)
lymph#: 3.4 10*3/uL — ABNORMAL HIGH (ref 0.9–3.3)

## 2014-02-12 NOTE — Progress Notes (Signed)
OFFICE PROGRESS NOTE  CC  Ariel Henderson,KARRAR, MD [address] [place] [address] Dr. Neldon Mc Dr. Thea Silversmith  DIAGNOSIS: 68 year old female with DCIS of the left breast diagnosed June 2014.  PRIOR THERAPY:  #1 patient presented with nipple discharge and had a mammogram and ductal ground performed. She was noted to have a small filling defect on the ductogram. She underwent surgical evaluation. Patient does have a prior history of biopsies of the breast dating back to 70.  #2 patient was seen by Dr. Neldon Mc she underwent a ductal excision and surgical biopsy of the area. The pathology revealed extensive DCIS with positive margins. She subsequently had a total mastectomy with reconstruction. The mastectomy was performed on 02/09/2013. Sentinel nodes were negative.  #3 patient was seen in medical oncology for adjuvant treatment options. She was seen in July 2014. She was prescribed tamoxifen 20 mg daily, but did not start this until January 2015. Total of 5 years is planned  CURRENT THERAPY: Tamoxifen 20 mg daily since January 2015  INTERVAL HISTORY: Ariel Henderson 68 y.o. female returns for followup visit today. She's tolerating tamoxifen very nicely. She is fully active with a performance status of 0. Reports ongoing anxiety and depression. Remains on Effexor. States that she was referred to counseling by her PCP, but did not go. She is not taking any new medications other than which are listed. She denies any shortness of breath chest pains palpitations she denies having any pain no nausea or vomiting no fevers chills or night sweats. She does have some hot flashes. Concerned about weight gain. Remainder of the 10 point review of systems is negative.  MEDICAL HISTORY: Past Medical History  Diagnosis Date  . GERD (gastroesophageal reflux disease)   . Migraine headache   . Chronic anxiety   . Major depression   . Dyslipidemia   . Fibrocystic  breast disease   . Complication of anesthesia     "slow to wake up"  . Hypertension   . Seasonal allergies   . Peripheral vascular disease     varicose veins  . Hypothyroidism   . Multinodular goiter   . Thyroid cancer     per patient on 02/01/2013; dx in 16s  . Pneumonia     hx  . History of recurrent UTIs   . Cancer     cervical per patient  . Melanoma     per patient on 02/01/13; located on back; dx in 21s  . Breast cancer 12/29/12 bx    left breast ductal,high grade ductal ca in situ w/asoc comedo necrosis    ALLERGIES:  is allergic to aspirin.  MEDICATIONS:  Current Outpatient Prescriptions  Medication Sig Dispense Refill  . acetaminophen (TYLENOL) 500 MG tablet Take 500 mg by mouth every 6 (six) hours as needed for pain.      Marland Kitchen almotriptan (AXERT) 12.5 MG tablet Take 12.5 mg by mouth daily as needed for migraine. may repeat in 2 hours if needed      . ALPRAZolam (XANAX) 0.25 MG tablet Take 1 tablet (0.25 mg total) by mouth at bedtime as needed for anxiety.  30 tablet  0  . aspirin 81 MG tablet Take 81 mg by mouth daily.      . cholecalciferol (VITAMIN D) 1000 UNITS tablet Take 1,000 Units by mouth daily.      . diazepam (VALIUM) 10 MG tablet Take 10 mg by mouth every 6 (six) hours as needed for anxiety.      Marland Kitchen  levothyroxine (SYNTHROID, LEVOTHROID) 137 MCG tablet       . lovastatin (MEVACOR) 20 MG tablet Take 20 mg by mouth at bedtime.      . methocarbamol (ROBAXIN) 500 MG tablet Take 1 tablet (500 mg total) by mouth every 6 (six) hours as needed.  30 tablet  1  . omeprazole (PRILOSEC) 40 MG capsule       . propranolol (INDERAL) 40 MG tablet Take 40 mg by mouth daily.      . ranitidine (ZANTAC) 75 MG tablet Take 75 mg by mouth 2 (two) times daily.      . tamoxifen (NOLVADEX) 20 MG tablet Take 20 mg by mouth daily.      Marland Kitchen venlafaxine (EFFEXOR) 75 MG tablet Take 75 mg by mouth 2 (two) times daily.      . vitamin E 100 UNIT capsule Take 100 Units by mouth daily.       No  current facility-administered medications for this visit.    SURGICAL HISTORY:  Past Surgical History  Procedure Laterality Date  . Wrist fracture surgery    . Thyroidectomy  1991  . Breast lumpectomy      left   x 3, right x 2  . Abdominal hysterectomy    . Eye surgery Bilateral     cataract extraction with IOL  . Breast ductal system excision Left 12/29/2012    Procedure: EXCISION DUCTAL SYSTEM BREAST;  Surgeon: Haywood Lasso, MD;  Location: [facility];  Service: General;  Laterality: Left;  . Total mastectomy Left 02/09/2013    WITH TISSUE EXPANDER & BIOPSY  . Mastectomy w/ sentinel node biopsy Left 02/09/2013    Procedure: TOTAL MASTECTOMY WITH SENTINEL LYMPH NODE BIOPSY;  Surgeon: Haywood Lasso, MD;  Location: [facility];  Service: General;  Laterality: Left;  . Breast reconstruction with placement of tissue expander and flex hd (acellular hydrated dermis) Left 02/09/2013    Procedure: LEFT BREAST RECONSTRUCTION WITH PLACEMENT OF TISSUE EXPANDER ;  Surgeon: Crissie Reese, MD;  Location: [facility];  Service: Plastics;  Laterality: Left;  . Breast lumpectomy Left 12/2009    lower inner=fibrocystic changes/sclerosing adenosis    REVIEW OF SYSTEMS:  Pertinent items are noted in HPI.     PHYSICAL EXAMINATION: Blood pressure 125/70, pulse 64, temperature 97.4 F (36.3 C), temperature source Oral, resp. rate 17, height 5\' 2"  (1.575 m), weight 174 lb 1.6 oz (78.971 kg), SpO2 95.00%. Body mass index is 31.84 kg/(m^2). ECOG PERFORMANCE STATUS: 0 - Asymptomatic   General appearance: alert, cooperative and appears stated age Lymph nodes: Cervical, supraclavicular, and axillary nodes normal. Resp: clear to auscultation bilaterally Back: symmetric, no curvature. ROM normal. No CVA tenderness. Cardio: regular rate and rhythm GI: soft, non-tender; bowel sounds normal; no masses,  no organomegaly Extremities: extremities normal, atraumatic, no cyanosis or edema Neurologic: Grossly normal Right  breast: No masses nipple discharge.  LABORATORY DATA: Lab Results  Component Value Date   WBC 9.4 02/12/2014   HGB 11.9 02/12/2014   HCT 37.1 02/12/2014   MCV 88.8 02/12/2014   PLT 266 02/12/2014      Chemistry      Component Value Date/Time   NA 142 02/12/2014 1336   NA 139 02/07/2013 1258   K 4.5 02/12/2014 1336   K 5.0 02/07/2013 1258   CL 105 02/07/2013 1258   CO2 26 02/12/2014 1336   CO2 27 02/07/2013 1258   BUN 15.8 02/12/2014 1336   BUN 15 02/07/2013 1258  CREATININE 0.8 02/12/2014 1336   CREATININE 0.67 02/07/2013 1258      Component Value Date/Time   CALCIUM 8.9 02/12/2014 1336   CALCIUM 9.3 02/07/2013 1258   ALKPHOS 49 02/12/2014 1336   AST 26 02/12/2014 1336   ALT 29 02/12/2014 1336   BILITOT 0.35 02/12/2014 1336       RADIOGRAPHIC STUDIES:  No results found.  ASSESSMENT/PLAN: 68 year old female with  #1 DCIS of the left breast diagnosed June 2014: Status post mastectomy with reconstruction. Subsequently begun on antiestrogen therapy with tamoxifen 20 mg daily starting in January 2015. Overall patient is tolerating it well. She is without any evidence of recurrent disease.  #2 thrombocytosis/leukocytosis: Resolved on labs today.  #3 anxiety/depression: Recommend that she pursue counseling. I have offered to refer her to a counselor [facility] at the [facility]. She will think about this.  #4 Weight gain: Recommended 30 minutes of exercise 5 times per week. Offered referral to dietician, but she has declined this.  #5 followup: Patient will be seen back in 6 months.      All questions were answered. The patient knows to call the clinic with any problems, questions or concerns. We can certainly see the patient much sooner if necessary.  I spent 20 minutes counseling the patient face to face. The total time spent in the appointment was 30 minutes.    Mikey Bussing, DNP, AGPCNP-BC  02/12/2014, 2:57 PM

## 2014-02-12 NOTE — Telephone Encounter (Signed)
gv adnrpinted appts ched and avs for pt fro Dec

## 2014-02-16 ENCOUNTER — Other Ambulatory Visit: Payer: Medicare Other

## 2014-02-16 ENCOUNTER — Ambulatory Visit: Payer: Medicare Other

## 2014-07-22 IMAGING — CR DG CHEST 2V
2 series · 2 of 2 positions shown · non-contrast
Comparison: 03/05/2010

CLINICAL DATA: Preoperative assessment for ductal excision left
breast, history hypertension, GERD, peripheral vascular disease

CHEST - 2 VIEW

[w chest pa]
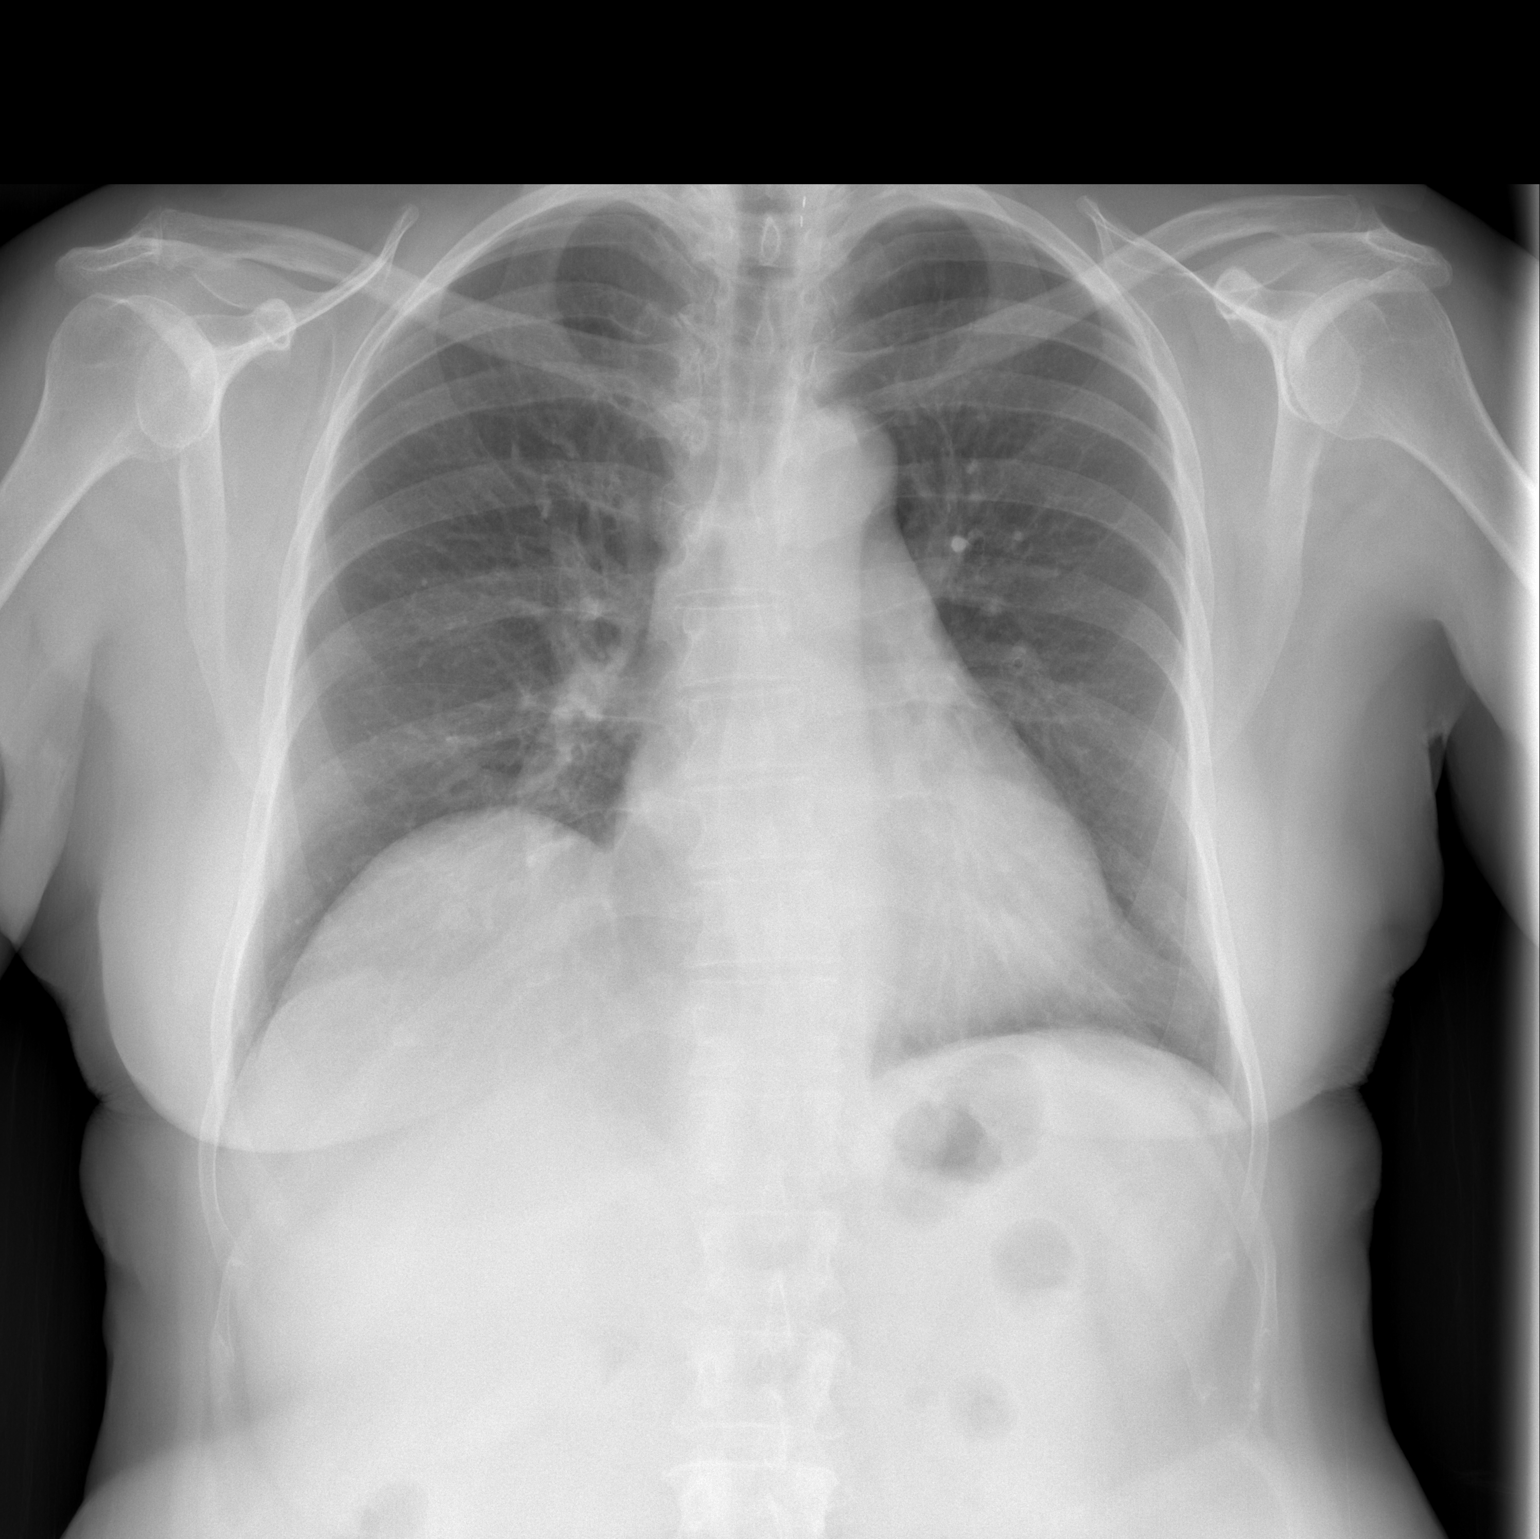

[w chest lat]
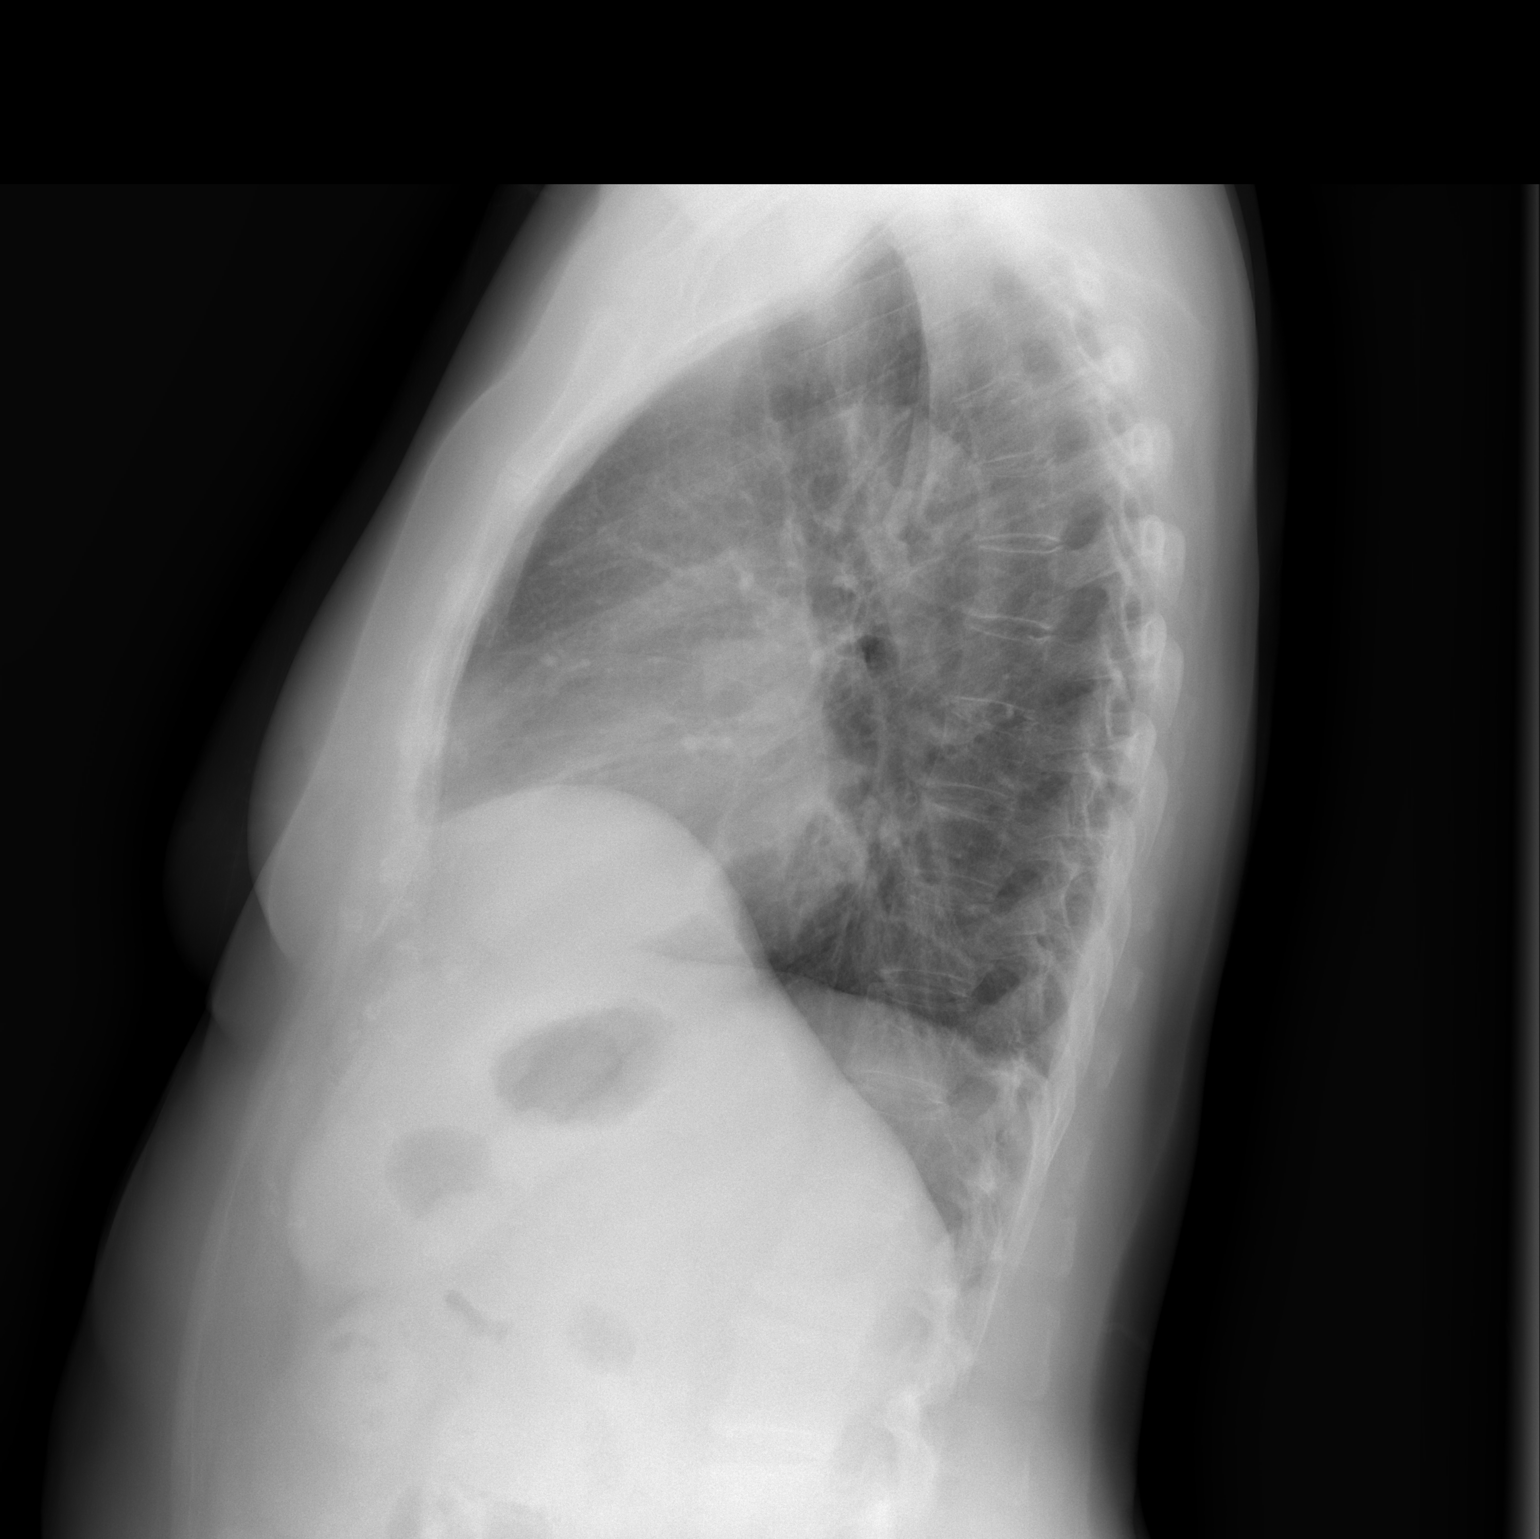

[2 of 2 positions shown; findings below may reference images not displayed]

FINDINGS: Upper-normal size of cardiac silhouette.
Mediastinal contours and pulmonary vascularity normal.
Chronic eventration anterior right diaphragm unchanged.
Lungs clear.
No pleural effusion or pneumothorax.
No acute osseous findings.
IMPRESSION: No acute abnormalities.

## 2014-08-14 ENCOUNTER — Ambulatory Visit (HOSPITAL_BASED_OUTPATIENT_CLINIC_OR_DEPARTMENT_OTHER): Payer: Medicare Other | Admitting: Hematology and Oncology

## 2014-08-14 ENCOUNTER — Telehealth: Payer: Self-pay | Admitting: Hematology and Oncology

## 2014-08-14 ENCOUNTER — Other Ambulatory Visit (HOSPITAL_BASED_OUTPATIENT_CLINIC_OR_DEPARTMENT_OTHER): Payer: Medicare Other

## 2014-08-14 DIAGNOSIS — C50912 Malignant neoplasm of unspecified site of left female breast: Secondary | ICD-10-CM

## 2014-08-14 DIAGNOSIS — N644 Mastodynia: Secondary | ICD-10-CM

## 2014-08-14 DIAGNOSIS — D0512 Intraductal carcinoma in situ of left breast: Secondary | ICD-10-CM

## 2014-08-14 DIAGNOSIS — C50112 Malignant neoplasm of central portion of left female breast: Secondary | ICD-10-CM

## 2014-08-14 LAB — COMPREHENSIVE METABOLIC PANEL (CC13)
ALBUMIN: 3.5 g/dL (ref 3.5–5.0)
ALK PHOS: 46 U/L (ref 40–150)
ALT: 17 U/L (ref 0–55)
AST: 19 U/L (ref 5–34)
Anion Gap: 10 mEq/L (ref 3–11)
BUN: 17.1 mg/dL (ref 7.0–26.0)
CO2: 24 mEq/L (ref 22–29)
Calcium: 8.7 mg/dL (ref 8.4–10.4)
Chloride: 107 mEq/L (ref 98–109)
Creatinine: 0.8 mg/dL (ref 0.6–1.1)
EGFR: 76 mL/min/{1.73_m2} — AB (ref >90)
Glucose: 105 mg/dl (ref 70–140)
POTASSIUM: 4.5 meq/L (ref 3.5–5.1)
Sodium: 141 mEq/L (ref 136–145)
Total Bilirubin: 0.31 mg/dL (ref 0.20–1.20)
Total Protein: 6.3 g/dL — ABNORMAL LOW (ref 6.4–8.3)

## 2014-08-14 LAB — CBC WITH DIFFERENTIAL/PLATELET
BASO%: 0.7 % (ref 0.0–2.0)
Basophils Absolute: 0.1 10*3/uL (ref 0.0–0.1)
EOS%: 3.1 % (ref 0.0–7.0)
Eosinophils Absolute: 0.3 10*3/uL (ref 0.0–0.5)
HCT: 38 % (ref 34.8–46.6)
HGB: 12.1 g/dL (ref 11.6–15.9)
LYMPH%: 35.6 % (ref 14.0–49.7)
MCH: 28.7 pg (ref 25.1–34.0)
MCHC: 31.8 g/dL (ref 31.5–36.0)
MCV: 90 fL (ref 79.5–101.0)
MONO#: 0.7 10*3/uL (ref 0.1–0.9)
MONO%: 7.8 % (ref 0.0–14.0)
NEUT#: 4.9 10*3/uL (ref 1.5–6.5)
NEUT%: 52.8 % (ref 38.4–76.8)
PLATELETS: 277 10*3/uL (ref 145–400)
RBC: 4.22 10*6/uL (ref 3.70–5.45)
RDW: 13.9 % (ref 11.2–14.5)
WBC: 9.2 10*3/uL (ref 3.9–10.3)
lymph#: 3.3 10*3/uL (ref 0.9–3.3)

## 2014-08-14 NOTE — Telephone Encounter (Signed)
per pof to sch pt appt-sch pt mamma-gave pt copy of sch °

## 2014-08-14 NOTE — Progress Notes (Signed)
Patient Care Team: Wenda Low, MD as PCP - General (Internal Medicine)  DIAGNOSIS: Cancer of central portion of left female breast   Staging form: Breast, AJCC 7th Edition     Clinical: No stage assigned - Unsigned       Prognostic indicators: ER= 0% PR = 0%      Pathologic: Stage 0 (Tis, N0, cM0) - Signed by Haywood Lasso, MD on 02/24/2013       Prognostic indicators: ER= 0% PR = 0%    SUMMARY OF ONCOLOGIC HISTORY:   Cancer of central portion of left female breast   12/29/2012 Surgery Left breast lumpectomy: High-grade DCIS with comedo necrosis extending to the margins ER 0% PR 0%   02/09/2013 Surgery Left breast mastectomy with reconstruction: DCIS high-grade with comedo necrosis 4 cm 2 SLN negative +1 additional lymph nodes negative, ER 0%, PR 0%    CHIEF COMPLIANT: Followup of DCIS on tamoxifen  INTERVAL HISTORY: Ariel Henderson is a 68 year old lady with above-mentioned history of DCIS of the left breast treated with initial lumpectomy but she had positive margins and hence she underwent a mastectomy followed by breast reconstruction. She was placed on tamoxifen therapy for adjuvant therapy. She has been tolerating it fairly well without any major problems. Her major complaints are pain in the left axilla as well as underneath the implant. She has not had a mammogram this year. She denies any hot flashes or myalgias related to tamoxifen.  REVIEW OF SYSTEMS:   Constitutional: Denies fevers, chills or abnormal weight loss Eyes: Denies blurriness of vision Ears, nose, mouth, throat, and face: Denies mucositis or sore throat Respiratory: Denies cough, dyspnea or wheezes Cardiovascular: Denies palpitation, chest discomfort or lower extremity swelling Gastrointestinal:  Denies nausea, heartburn or change in bowel habits Skin: Denies abnormal skin rashes Lymphatics: Denies new lymphadenopathy or easy bruising Neurological:Denies numbness, tingling or new  weaknesses Behavioral/Psych: Mood is stable, no new changes  Breast: pain underneath the left axilla All other systems were reviewed with the patient and are negative.  I have reviewed the past medical history, past surgical history, social history and family history with the patient and they are unchanged from previous note.  ALLERGIES:  is allergic to aspirin.  MEDICATIONS:  Current Outpatient Prescriptions  Medication Sig Dispense Refill  . acetaminophen (TYLENOL) 500 MG tablet Take 500 mg by mouth every 6 (six) hours as needed for pain.    Marland Kitchen almotriptan (AXERT) 12.5 MG tablet Take 12.5 mg by mouth daily as needed for migraine. may repeat in 2 hours if needed    . ALPRAZolam (XANAX) 0.25 MG tablet Take 1 tablet (0.25 mg total) by mouth at bedtime as needed for anxiety. 30 tablet 0  . aspirin 81 MG tablet Take 81 mg by mouth daily.    . cholecalciferol (VITAMIN D) 1000 UNITS tablet Take 1,000 Units by mouth daily.    . diazepam (VALIUM) 10 MG tablet Take 10 mg by mouth every 6 (six) hours as needed for anxiety.    Marland Kitchen levothyroxine (SYNTHROID, LEVOTHROID) 137 MCG tablet     . lovastatin (MEVACOR) 20 MG tablet Take 20 mg by mouth at bedtime.    . methocarbamol (ROBAXIN) 500 MG tablet Take 1 tablet (500 mg total) by mouth every 6 (six) hours as needed. 30 tablet 1  . omeprazole (PRILOSEC) 40 MG capsule     . propranolol (INDERAL) 40 MG tablet Take 40 mg by mouth daily.    . ranitidine (ZANTAC) 75  MG tablet Take 75 mg by mouth 2 (two) times daily.    . tamoxifen (NOLVADEX) 20 MG tablet Take 20 mg by mouth daily.    Marland Kitchen venlafaxine (EFFEXOR) 75 MG tablet Take 75 mg by mouth 2 (two) times daily.    . vitamin E 100 UNIT capsule Take 100 Units by mouth daily.     No current facility-administered medications for this visit.    PHYSICAL EXAMINATION: ECOG PERFORMANCE STATUS: 1 - Symptomatic but completely ambulatory  Filed Vitals:   08/14/14 1128  BP: 126/70  Pulse: 72  Temp: 97.7 F (36.5  C)  Resp: 18   Filed Weights   08/14/14 1128  Weight: 177 lb 3.2 oz (80.377 kg)    GENERAL:alert, no distress and comfortable SKIN: skin color, texture, turgor are normal, no rashes or significant lesions EYES: normal, Conjunctiva are pink and non-injected, sclera clear OROPHARYNX:no exudate, no erythema and lips, buccal mucosa, and tongue normal  NECK: supple, thyroid normal size, non-tender, without nodularity LYMPH:  no palpable lymphadenopathy in the cervical, axillary or inguinal LUNGS: clear to auscultation and percussion with normal breathing effort HEART: regular rate & rhythm and no murmurs and no lower extremity edema ABDOMEN:abdomen soft, non-tender and normal bowel sounds Musculoskeletal:no cyanosis of digits and no clubbing  NEURO: alert & oriented x 3 with fluent speech, no focal motor/sensory deficits BREAST:living No palpable masses or nodules in either right or left breasts. No palpable axillary supraclavicular or infraclavicular adenopathy no breast tenderness or nipple discharge.   LABORATORY DATA:  I have reviewed the data as listed   Chemistry      Component Value Date/Time   NA 141 08/14/2014 1112   NA 139 02/07/2013 1258   K 4.5 08/14/2014 1112   K 5.0 02/07/2013 1258   CL 105 02/07/2013 1258   CO2 24 08/14/2014 1112   CO2 27 02/07/2013 1258   BUN 17.1 08/14/2014 1112   BUN 15 02/07/2013 1258   CREATININE 0.8 08/14/2014 1112   CREATININE 0.67 02/07/2013 1258      Component Value Date/Time   CALCIUM 8.7 08/14/2014 1112   CALCIUM 9.3 02/07/2013 1258   ALKPHOS 46 08/14/2014 1112   AST 19 08/14/2014 1112   ALT 17 08/14/2014 1112   BILITOT 0.31 08/14/2014 1112       Lab Results  Component Value Date   WBC 9.2 08/14/2014   HGB 12.1 08/14/2014   HCT 38.0 08/14/2014   MCV 90.0 08/14/2014   PLT 277 08/14/2014   NEUTROABS 4.9 08/14/2014    ASSESSMENT & PLAN:  Cancer of central portion of left female breast DCIS left breast diagnosed in 2014  status post mastectomy after the initial lumpectomy had positive margins. She underwent breast reconstruction. She has been on oral tamoxifen since July 2014. Tolerating tamoxifen fairly well.  Surveillance: Breast exam today was normal. She has not had a mammogram in 2015. I will order a diagnostic mammogram on the right breast. She has also been set up to follow with Dr. Zella Richer for continued surveillance of breast cancer.  Tamoxifen counseling: I discussed the pros and cons of tamoxifen. Her risk of breast cancer is 0.5% per year since he only has one breast. If her life expectancy was 20 years, risk of recurrence in the 10%. Tamoxifen would lower that down to 5%. Since the benefit it appears to be relatively small, we elected to discontinue tamoxifen therapy.  Surveillance: I discussed with her the surveillance strategy for breast cancer would be  with breast exams and mammograms. Patient was very upset that she had not been seen for 18 months. In fact she has been seen in the clinic in January and in June. He does administrative that she does not remember being seen at any of those visits.  Left breast pain: There is a tender spot in the axilla without any palpable abnormality. I suspect this is post operative in nature.  Return to clinic in 6 months for follow   Orders Placed This Encounter  Procedures  . MM Digital Diagnostic Unilat R    Standing Status: Future     Number of Occurrences:      Standing Expiration Date: 08/14/2015    Order Specific Question:  Reason for Exam (SYMPTOM  OR DIAGNOSIS REQUIRED)    Answer:  annual mammogram    Order Specific Question:  Preferred imaging location?    Answer:  [facility]   The patient has a good understanding of the overall plan. she agrees with it. She will call with any problems that may develop before her next visit here.   Rulon Eisenmenger, MD 08/14/2014 12:36 PM

## 2014-08-14 NOTE — Assessment & Plan Note (Signed)
DCIS left breast diagnosed in 2014 status post mastectomy after the initial lumpectomy had positive margins. She underwent breast reconstruction. She has been on oral tamoxifen since July 2014. Tolerating tamoxifen fairly well.  Surveillance: Breast exam today was normal. She has not had a mammogram in 2015. I will order a diagnostic mammogram on the right breast. She has also been set up to follow with Dr. Zella Richer for continued surveillance of breast cancer.  Tamoxifen counseling: I discussed the pros and cons of tamoxifen. Her risk of breast cancer is 0.5% per year since he only has one breast. If her life expectancy was 20 years, risk of recurrence in the 10%. Tamoxifen would lower that down to 5%. Since the benefit it appears to be relatively small, we elected to discontinue tamoxifen therapy.  Surveillance: I discussed with her the surveillance strategy for breast cancer would be with breast exams and mammograms. Patient was very upset that she had not been seen for 18 months. In fact she has been seen in the clinic in January and in June. He does administrative that she does not remember being seen at any of those visits.  Left breast pain: There is a tender spot in the axilla without any palpable abnormality. I suspect this is post operative in nature.  Return to clinic in 6 months for follow

## 2014-10-03 ENCOUNTER — Ambulatory Visit (INDEPENDENT_AMBULATORY_CARE_PROVIDER_SITE_OTHER): Payer: Medicare Other | Admitting: Cardiology

## 2014-10-03 ENCOUNTER — Ambulatory Visit
Admission: RE | Admit: 2014-10-03 | Discharge: 2014-10-03 | Disposition: A | Discharge status: Home / Self Care | Payer: Medicare Other | Source: Ambulatory Visit | Attending: Cardiology | Admitting: Cardiology

## 2014-10-03 ENCOUNTER — Other Ambulatory Visit: Payer: Self-pay | Admitting: Cardiology

## 2014-10-03 ENCOUNTER — Encounter: Payer: Self-pay | Admitting: Cardiology

## 2014-10-03 ENCOUNTER — Telehealth: Payer: Self-pay | Admitting: Hematology and Oncology

## 2014-10-03 VITALS — BP 110/78 | HR 60 | Ht 62.0 in | Wt 179.1 lb

## 2014-10-03 DIAGNOSIS — R9431 Abnormal electrocardiogram [ECG] [EKG]: Secondary | ICD-10-CM

## 2014-10-03 DIAGNOSIS — I10 Essential (primary) hypertension: Secondary | ICD-10-CM

## 2014-10-03 DIAGNOSIS — I200 Unstable angina: Secondary | ICD-10-CM

## 2014-10-03 DIAGNOSIS — E785 Hyperlipidemia, unspecified: Secondary | ICD-10-CM

## 2014-10-03 DIAGNOSIS — R079 Chest pain, unspecified: Secondary | ICD-10-CM

## 2014-10-03 LAB — CBC WITH DIFFERENTIAL/PLATELET
BASOS PCT: 1 % (ref 0–1)
Basophils Absolute: 0.1 10*3/uL (ref 0.0–0.1)
Eosinophils Absolute: 0.4 10*3/uL (ref 0.0–0.7)
Eosinophils Relative: 3 % (ref 0–5)
HCT: 39.4 % (ref 36.0–46.0)
Hemoglobin: 13.1 g/dL (ref 12.0–15.0)
Lymphocytes Relative: 37 % (ref 12–46)
Lymphs Abs: 4.4 10*3/uL — ABNORMAL HIGH (ref 0.7–4.0)
MCH: 29 pg (ref 26.0–34.0)
MCHC: 33.2 g/dL (ref 30.0–36.0)
MCV: 87.4 fL (ref 78.0–100.0)
MONO ABS: 0.8 10*3/uL (ref 0.1–1.0)
MPV: 10.3 fL (ref 8.6–12.4)
Monocytes Relative: 7 % (ref 3–12)
Neutro Abs: 6.1 10*3/uL (ref 1.7–7.7)
Neutrophils Relative %: 52 % (ref 43–77)
Platelets: 333 10*3/uL (ref 150–400)
RBC: 4.51 MIL/uL (ref 3.87–5.11)
RDW: 14.7 % (ref 11.5–15.5)
WBC: 11.8 10*3/uL — AB (ref 4.0–10.5)

## 2014-10-03 MED ORDER — NITROGLYCERIN 0.4 MG SL SUBL: 0.4000 mg | SUBLINGUAL_TABLET | SUBLINGUAL | Status: DC | PRN

## 2014-10-03 NOTE — Patient Instructions (Signed)
Continue your current therapy  I recommend you have a cardiac catheterization.

## 2014-10-03 NOTE — Telephone Encounter (Signed)
due to pal 6/13 appt moved to 6/27. s/w pt she is aware.

## 2014-10-03 NOTE — Progress Notes (Signed)
Ariel Henderson Date of Birth: 1946/08/24 Medical Record #[id number]  History of Present Illness: Ariel Henderson is a pleasant 69 yo WF seen at the request of Dr. Lysle Rubens for evaluation of chest pain. She has a history of HTN and hyperlipidemia. No prior history of heart disease. This past Friday she had an episode of chest pain at rest. She describes this as pain and squeezing across her anterior chest without radiation. No SOB. She felt hot. She took ASA and pain resolved after 45 minutes. She was seen by Dr. Lysle Rubens yesterday and Ecg demonstrates anterior T wave inversion consistent with ischemia. Troponin level was normal yesterday. She has had no further chest pain. No edema or palpitations.      Medication List       This list is accurate as of: 10/03/14  6:17 PM.  Always use your most recent med list.               acetaminophen 500 MG tablet  Commonly known as:  TYLENOL  Take 500 mg by mouth every 6 (six) hours as needed for pain.     almotriptan 12.5 MG tablet  Commonly known as:  AXERT  Take 12.5 mg by mouth daily as needed for migraine. may repeat in 2 hours if needed     ALPRAZolam 0.25 MG tablet  Commonly known as:  XANAX  Take 1 tablet (0.25 mg total) by mouth at bedtime as needed for anxiety.     aspirin 81 MG tablet  Take 81 mg by mouth daily.     cholecalciferol 1000 UNITS tablet  Commonly known as:  VITAMIN D  Take 1,000 Units by mouth daily.     diazepam 10 MG tablet  Commonly known as:  VALIUM  Take 10 mg by mouth every 6 (six) hours as needed for anxiety.     levothyroxine 137 MCG tablet  Commonly known as:  SYNTHROID, LEVOTHROID     lovastatin 20 MG tablet  Commonly known as:  MEVACOR  Take 20 mg by mouth at bedtime.     methocarbamol 500 MG tablet  Commonly known as:  ROBAXIN  Take 1 tablet (500 mg total) by mouth every 6 (six) hours as needed.     nitroGLYCERIN 0.4 MG SL tablet  Commonly known as:  NITROSTAT  Place 1 tablet (0.4 mg total)  under the tongue every 5 (five) minutes as needed for chest pain.     omeprazole 40 MG capsule  Commonly known as:  PRILOSEC     propranolol 40 MG tablet  Commonly known as:  INDERAL  Take 40 mg by mouth daily.     ranitidine 75 MG tablet  Commonly known as:  ZANTAC  Take 75 mg by mouth 2 (two) times daily.     tamoxifen 20 MG tablet  Commonly known as:  NOLVADEX  Take 20 mg by mouth daily.     venlafaxine 75 MG tablet  Commonly known as:  EFFEXOR  Take 75 mg by mouth 2 (two) times daily.     vitamin E 100 UNIT capsule  Take 100 Units by mouth daily.         Allergies  Allergen Reactions  . Aspirin Nausea Only    Non coated CAN TAKE 81 MG     Past Medical History  Diagnosis Date  . GERD (gastroesophageal reflux disease)   . Migraine headache   . Chronic anxiety   . Major depression   . Dyslipidemia   .  Fibrocystic breast disease   . Complication of anesthesia     "slow to wake up"  . Hypertension   . Seasonal allergies   . Peripheral vascular disease     varicose veins  . Hypothyroidism   . Multinodular goiter   . Thyroid cancer     per patient on 02/01/2013; dx in 63s  . Pneumonia     hx  . History of recurrent UTIs   . Cancer     cervical per patient  . Melanoma     per patient on 02/01/13; located on back; dx in 61s  . Breast cancer 12/29/12 bx    left breast ductal,high grade ductal ca in situ w/asoc comedo necrosis    Past Surgical History  Procedure Laterality Date  . Wrist fracture surgery    . Thyroidectomy  1991  . Breast lumpectomy      left   x 3, right x 2  . Abdominal hysterectomy    . Eye surgery Bilateral     cataract extraction with IOL  . Breast ductal system excision Left 12/29/2012    Procedure: EXCISION DUCTAL SYSTEM BREAST;  Surgeon: Haywood Lasso, MD;  Location: [facility];  Service: General;  Laterality: Left;  . Total mastectomy Left 02/09/2013    WITH TISSUE EXPANDER & BIOPSY  . Mastectomy w/ sentinel node biopsy Left  02/09/2013    Procedure: TOTAL MASTECTOMY WITH SENTINEL LYMPH NODE BIOPSY;  Surgeon: Haywood Lasso, MD;  Location: [facility];  Service: General;  Laterality: Left;  . Breast reconstruction with placement of tissue expander and flex hd (acellular hydrated dermis) Left 02/09/2013    Procedure: LEFT BREAST RECONSTRUCTION WITH PLACEMENT OF TISSUE EXPANDER ;  Surgeon: Crissie Reese, MD;  Location: [facility];  Service: Plastics;  Laterality: Left;  . Breast lumpectomy Left 12/2009    lower inner=fibrocystic changes/sclerosing adenosis    History   Social History  . Marital Status: Married    Spouse Name: N/A    Number of Children: 1  . Years of Education: N/A   Social History Main Topics  . Smoking status: Never Smoker   . Smokeless tobacco: Never Used  . Alcohol Use: No  . Drug Use: No  . Sexual Activity: Not Currently   Other Topics Concern  . None   Social History Narrative    Family History  Problem Relation Age of Onset  . Colon cancer Brother 67  . Prostate cancer Brother 45  . Lung cancer Father 19    smoker  . Ovarian cancer Sister 31  . Brain cancer Sister 10  . Prostate cancer Brother 63  . Prostate cancer Brother 52  . Prostate cancer Brother 27  . Prostate cancer Brother 59  . Cancer Brother 19    metastatic; unknown primary    Review of Systems: The review of systems is positive for history of gastric ulceration in the remote past. No recent bleeding.  All other systems were reviewed and are negative.  Physical Exam: BP 110/78 mmHg  Pulse 60  Ht 5\' 2"  (1.575 m)  Wt 179 lb 1.6 oz (81.239 kg)  BMI 32.75 kg/m2 Filed Weights   10/03/14 0900  Weight: 179 lb 1.6 oz (81.239 kg)  GENERAL:  Well appearing WF in NAD HEENT:  PERRL, EOMI, sclera are clear. Oropharynx is clear. NECK:  No jugular venous distention, carotid upstroke brisk and symmetric, no bruits, no thyromegaly or adenopathy LUNGS:  Clear to auscultation bilaterally CHEST:  Unremarkable  HEART:  RRR,  PMI  not displaced or sustained,S1 and S2 within normal limits, no S3, no S4: no clicks, no rubs, no murmurs ABD:  Soft, nontender. BS +, no masses or bruits. No hepatomegaly, no splenomegaly EXT:  2 + pulses throughout, no edema, no cyanosis no clubbing SKIN:  Warm and dry.  No rashes NEURO:  Alert and oriented x 3. Cranial nerves II through XII intact. PSYCH:  Cognitively intact    LABORATORY DATA: Ecg today shows NSR with T wave inversion consistent with anterolateral ischemia. Compared to prior Ecgs in 2014 these changes are new.  CLINICAL DATA: Pre cardiac catheterization, chest tightness, history of left mastectomy  EXAM: CHEST 2 VIEW  COMPARISON: Chest x-ray of 12/27/2012  FINDINGS: No active infiltrate or effusion is seen. The right hemidiaphragm appears chronically elevated. Mild cardiomegaly is stable. No acute bony abnormality is seen.  IMPRESSION: No active lung disease. Stable mild cardiomegaly and chronic elevation of the right hemidiaphragm.   Electronically Signed  By: Ivar Drape M.D.  On: 10/03/2014 14:18  Assessment / Plan: 1. New onset chest pain associated with new anterolateral T wave inversions consistent with unstable angina. We discussed further work up including stress testing versus cardiac cath. After discussion patient has opted to proceed with cardiac cath. The procedure and risks were reviewed including but not limited to death, myocardial infarction, stroke, arrythmias, bleeding, transfusion, emergency surgery, dye allergy, or renal dysfunction. The patient voices understanding and is agreeable to proceed. I have recommended taking ASA daily. Continue beta blocker and statin. Prescribed sl Ntg to use prn. Explained that is she has recurrent chest pain that doesn't respond to Ntg she is to call EMS and come to the hospital.   2. HTN controlled.  3. Hyperlipidemia. On statin.  4. Breast CA s/p mastectomy. On Tamoxifen.  5.  Hypothyroidism

## 2014-10-04 LAB — PROTIME-INR
INR: 1.01 (ref <1.50)
PROTHROMBIN TIME: 13.3 s (ref 11.6–15.2)

## 2014-10-04 LAB — BASIC METABOLIC PANEL
BUN: 12 mg/dL (ref 6–23)
CALCIUM: 9.7 mg/dL (ref 8.4–10.5)
CHLORIDE: 103 meq/L (ref 96–112)
CO2: 24 meq/L (ref 19–32)
CREATININE: 0.72 mg/dL (ref 0.50–1.10)
Glucose, Bld: 94 mg/dL (ref 70–99)
Potassium: 4.8 mEq/L (ref 3.5–5.3)
Sodium: 138 mEq/L (ref 135–145)

## 2014-10-08 ENCOUNTER — Encounter (HOSPITAL_COMMUNITY): Payer: Self-pay | Admitting: Pharmacy Technician

## 2014-10-08 ENCOUNTER — Telehealth: Payer: Self-pay | Admitting: Cardiology

## 2014-10-08 NOTE — Telephone Encounter (Signed)
Pt called in wanting to speak with Malachy Mood about a pain that she is having in between her breast bone. Please call  Thanks

## 2014-10-08 NOTE — Telephone Encounter (Signed)
I called patient back and Malachy Mood has already taken care of the situation.

## 2014-10-08 NOTE — Telephone Encounter (Signed)
Returned call to patient she stated she had chest pain yesterday relieved by NTG x 1.Stated no chest pain today.Stated chest feels sore.Advised to keep appointment for cath as planned 10/10/14.Advised if she has chest pain and not relieved by NTG she will need to go to ER.

## 2014-10-10 ENCOUNTER — Ambulatory Visit (HOSPITAL_COMMUNITY)
Admission: RE | Admit: 2014-10-10 | Discharge: 2014-10-10 | Disposition: A | Discharge status: Home / Self Care | Payer: Medicare Other | Source: Ambulatory Visit | Attending: Cardiology | Admitting: Cardiology

## 2014-10-10 ENCOUNTER — Encounter (HOSPITAL_COMMUNITY): Payer: Self-pay | Admitting: Cardiology

## 2014-10-10 ENCOUNTER — Encounter (HOSPITAL_COMMUNITY)
Admission: RE | Disposition: A | Discharge status: Home / Self Care | Payer: Self-pay | Source: Ambulatory Visit | Attending: Cardiology

## 2014-10-10 DIAGNOSIS — I200 Unstable angina: Secondary | ICD-10-CM | POA: Diagnosis not present

## 2014-10-10 DIAGNOSIS — E785 Hyperlipidemia, unspecified: Secondary | ICD-10-CM | POA: Diagnosis present

## 2014-10-10 DIAGNOSIS — Z7982 Long term (current) use of aspirin: Secondary | ICD-10-CM | POA: Diagnosis not present

## 2014-10-10 DIAGNOSIS — R079 Chest pain, unspecified: Secondary | ICD-10-CM | POA: Diagnosis present

## 2014-10-10 DIAGNOSIS — F329 Major depressive disorder, single episode, unspecified: Secondary | ICD-10-CM | POA: Insufficient documentation

## 2014-10-10 DIAGNOSIS — E039 Hypothyroidism, unspecified: Secondary | ICD-10-CM | POA: Diagnosis not present

## 2014-10-10 DIAGNOSIS — K219 Gastro-esophageal reflux disease without esophagitis: Secondary | ICD-10-CM | POA: Insufficient documentation

## 2014-10-10 DIAGNOSIS — F419 Anxiety disorder, unspecified: Secondary | ICD-10-CM | POA: Insufficient documentation

## 2014-10-10 DIAGNOSIS — I10 Essential (primary) hypertension: Secondary | ICD-10-CM | POA: Insufficient documentation

## 2014-10-10 DIAGNOSIS — G43909 Migraine, unspecified, not intractable, without status migrainosus: Secondary | ICD-10-CM | POA: Diagnosis not present

## 2014-10-10 DIAGNOSIS — R9431 Abnormal electrocardiogram [ECG] [EKG]: Secondary | ICD-10-CM | POA: Diagnosis present

## 2014-10-10 DIAGNOSIS — I739 Peripheral vascular disease, unspecified: Secondary | ICD-10-CM | POA: Diagnosis not present

## 2014-10-10 DIAGNOSIS — Z853 Personal history of malignant neoplasm of breast: Secondary | ICD-10-CM | POA: Insufficient documentation

## 2014-10-10 DIAGNOSIS — Z85850 Personal history of malignant neoplasm of thyroid: Secondary | ICD-10-CM | POA: Insufficient documentation

## 2014-10-10 DIAGNOSIS — Z79899 Other long term (current) drug therapy: Secondary | ICD-10-CM | POA: Diagnosis not present

## 2014-10-10 HISTORY — PX: LEFT HEART CATHETERIZATION WITH CORONARY ANGIOGRAM: SHX5451

## 2014-10-10 SURGERY — LEFT HEART CATHETERIZATION WITH CORONARY ANGIOGRAM

## 2014-10-10 MED ORDER — SODIUM CHLORIDE 0.9 % IJ SOLN: 3.0000 mL | Freq: Two times a day (BID) | INTRAMUSCULAR | Status: DC

## 2014-10-10 MED ORDER — HEPARIN (PORCINE) IN NACL 2-0.9 UNIT/ML-% IJ SOLN
INTRAMUSCULAR | Status: AC
  Filled 2014-10-10: qty 1000

## 2014-10-10 MED ORDER — SODIUM CHLORIDE 0.9 % IJ SOLN: 3.0000 mL | INTRAMUSCULAR | Status: DC | PRN

## 2014-10-10 MED ORDER — MIDAZOLAM HCL 2 MG/2ML IJ SOLN
INTRAMUSCULAR | Status: AC
  Filled 2014-10-10: qty 2

## 2014-10-10 MED ORDER — ASPIRIN 81 MG PO CHEW
CHEWABLE_TABLET | ORAL | Status: AC
  Administered 2014-10-10: 81 mg via ORAL
  Filled 2014-10-10: qty 1

## 2014-10-10 MED ORDER — ASPIRIN 81 MG PO CHEW
81.0000 mg | CHEWABLE_TABLET | ORAL | Status: AC
  Administered 2014-10-10: 81 mg via ORAL

## 2014-10-10 MED ORDER — SODIUM CHLORIDE 0.9 % IV SOLN
1.0000 mL/kg/h | INTRAVENOUS | Status: DC
  Administered 2014-10-10: 1 mL/kg/h via INTRAVENOUS

## 2014-10-10 MED ORDER — SODIUM CHLORIDE 0.9 % IV SOLN
INTRAVENOUS | Status: DC
  Administered 2014-10-10: 12:00:00 via INTRAVENOUS

## 2014-10-10 MED ORDER — FENTANYL CITRATE 0.05 MG/ML IJ SOLN
INTRAMUSCULAR | Status: AC
  Filled 2014-10-10: qty 2

## 2014-10-10 MED ORDER — LIDOCAINE HCL (PF) 1 % IJ SOLN
INTRAMUSCULAR | Status: AC
  Filled 2014-10-10: qty 30

## 2014-10-10 MED ORDER — SODIUM CHLORIDE 0.9 % IV SOLN: 250.0000 mL | INTRAVENOUS | Status: DC | PRN

## 2014-10-10 MED ORDER — VERAPAMIL HCL 2.5 MG/ML IV SOLN
INTRAVENOUS | Status: AC
  Filled 2014-10-10: qty 2

## 2014-10-10 NOTE — CV Procedure (Signed)
    Cardiac Catheterization Procedure Note  Name: Ariel Henderson MRN: [id number] DOB: 03/11/1946  Procedure: Left Heart Cath, Selective Coronary Angiography, LV angiography  Indication: 69 yo WF with recent onset chest pain and T wave inversion on Ecg.   Procedural Details: The right wrist was prepped, draped, and anesthetized with 1% lidocaine. Using the modified Seldinger technique, a 6 French slender sheath was introduced into the right radial artery. 3 mg of verapamil was administered through the sheath, weight-based unfractionated heparin was administered intravenously. Standard Judkins catheters were used for selective coronary angiography and left ventriculography. Catheter exchanges were performed over an exchange length guidewire. There were no immediate procedural complications. A TR band was used for radial hemostasis at the completion of the procedure.  The patient was transferred to the post catheterization recovery area for further monitoring.  Procedural Findings: Hemodynamics: AO 136/61 mean 93 mm Hg LV 135/11 mm Hg  Coronary angiography: Coronary dominance: left  Left mainstem: Normal  Left anterior descending (LAD): Normal  Left circumflex (LCx): Very large, Normal.  Right coronary artery (RCA): Normal  Left ventriculography: Left ventricular systolic function is normal, LVEF is estimated at 55-65%, there is no significant mitral regurgitation   Final Conclusions:   1. Normal coronary anatomy 2. Normal LV function.   Recommendations: consider noncardiac causes of her chest pain.  [name], [place]  10/10/2014, 2:51 PM

## 2014-10-10 NOTE — H&P (View-Only) (Signed)
[name] Date of Birth: 1946-05-25 Medical Record #[id number]  History of Present Illness: Mrs Dugue is a pleasant 69 yo WF seen at the request of Dr. Lysle Rubens for evaluation of chest pain. She has a history of HTN and hyperlipidemia. No prior history of heart disease. This past Friday she had an episode of chest pain at rest. She describes this as pain and squeezing across her anterior chest without radiation. No SOB. She felt hot. She took ASA and pain resolved after 45 minutes. She was seen by Dr. Lysle Rubens yesterday and Ecg demonstrates anterior T wave inversion consistent with ischemia. Troponin level was normal yesterday. She has had no further chest pain. No edema or palpitations.      Medication List       This list is accurate as of: 10/03/14  6:17 PM.  Always use your most recent med list.               acetaminophen 500 MG tablet  Commonly known as:  TYLENOL  Take 500 mg by mouth every 6 (six) hours as needed for pain.     almotriptan 12.5 MG tablet  Commonly known as:  AXERT  Take 12.5 mg by mouth daily as needed for migraine. may repeat in 2 hours if needed     ALPRAZolam 0.25 MG tablet  Commonly known as:  XANAX  Take 1 tablet (0.25 mg total) by mouth at bedtime as needed for anxiety.     aspirin 81 MG tablet  Take 81 mg by mouth daily.     cholecalciferol 1000 UNITS tablet  Commonly known as:  VITAMIN D  Take 1,000 Units by mouth daily.     diazepam 10 MG tablet  Commonly known as:  VALIUM  Take 10 mg by mouth every 6 (six) hours as needed for anxiety.     levothyroxine 137 MCG tablet  Commonly known as:  SYNTHROID, LEVOTHROID     lovastatin 20 MG tablet  Commonly known as:  MEVACOR  Take 20 mg by mouth at bedtime.     methocarbamol 500 MG tablet  Commonly known as:  ROBAXIN  Take 1 tablet (500 mg total) by mouth every 6 (six) hours as needed.     nitroGLYCERIN 0.4 MG SL tablet  Commonly known as:  NITROSTAT  Place 1 tablet (0.4 mg total)  under the tongue every 5 (five) minutes as needed for chest pain.     omeprazole 40 MG capsule  Commonly known as:  PRILOSEC     propranolol 40 MG tablet  Commonly known as:  INDERAL  Take 40 mg by mouth daily.     ranitidine 75 MG tablet  Commonly known as:  ZANTAC  Take 75 mg by mouth 2 (two) times daily.     tamoxifen 20 MG tablet  Commonly known as:  NOLVADEX  Take 20 mg by mouth daily.     venlafaxine 75 MG tablet  Commonly known as:  EFFEXOR  Take 75 mg by mouth 2 (two) times daily.     vitamin E 100 UNIT capsule  Take 100 Units by mouth daily.         Allergies  Allergen Reactions  . Aspirin Nausea Only    Non coated CAN TAKE 81 MG     Past Medical History  Diagnosis Date  . GERD (gastroesophageal reflux disease)   . Migraine headache   . Chronic anxiety   . Major depression   . Dyslipidemia   .  Fibrocystic breast disease   . Complication of anesthesia     "slow to wake up"  . Hypertension   . Seasonal allergies   . Peripheral vascular disease     varicose veins  . Hypothyroidism   . Multinodular goiter   . Thyroid cancer     per patient on 02/01/2013; dx in 47s  . Pneumonia     hx  . History of recurrent UTIs   . Cancer     cervical per patient  . Melanoma     per patient on 02/01/13; located on back; dx in 54s  . Breast cancer 12/29/12 bx    left breast ductal,high grade ductal ca in situ w/asoc comedo necrosis    Past Surgical History  Procedure Laterality Date  . Wrist fracture surgery    . Thyroidectomy  1991  . Breast lumpectomy      left   x 3, right x 2  . Abdominal hysterectomy    . Eye surgery Bilateral     cataract extraction with IOL  . Breast ductal system excision Left 12/29/2012    Procedure: EXCISION DUCTAL SYSTEM BREAST;  Surgeon: Haywood Lasso, MD;  Location: [facility];  Service: General;  Laterality: Left;  . Total mastectomy Left 02/09/2013    WITH TISSUE EXPANDER & BIOPSY  . Mastectomy w/ sentinel node biopsy Left  02/09/2013    Procedure: TOTAL MASTECTOMY WITH SENTINEL LYMPH NODE BIOPSY;  Surgeon: Haywood Lasso, MD;  Location: [facility];  Service: General;  Laterality: Left;  . Breast reconstruction with placement of tissue expander and flex hd (acellular hydrated dermis) Left 02/09/2013    Procedure: LEFT BREAST RECONSTRUCTION WITH PLACEMENT OF TISSUE EXPANDER ;  Surgeon: Crissie Reese, MD;  Location: [facility];  Service: Plastics;  Laterality: Left;  . Breast lumpectomy Left 12/2009    lower inner=fibrocystic changes/sclerosing adenosis    History   Social History  . Marital Status: Married    Spouse Name: N/A    Number of Children: 1  . Years of Education: N/A   Social History Main Topics  . Smoking status: Never Smoker   . Smokeless tobacco: Never Used  . Alcohol Use: No  . Drug Use: No  . Sexual Activity: Not Currently   Other Topics Concern  . None   Social History Narrative    Family History  Problem Relation Age of Onset  . Colon cancer Brother 28  . Prostate cancer Brother 66  . Lung cancer Father 48    smoker  . Ovarian cancer Sister 80  . Brain cancer Sister 61  . Prostate cancer Brother 43  . Prostate cancer Brother 58  . Prostate cancer Brother 31  . Prostate cancer Brother 76  . Cancer Brother 75    metastatic; unknown primary    Review of Systems: The review of systems is positive for history of gastric ulceration in the remote past. No recent bleeding.  All other systems were reviewed and are negative.  Physical Exam: BP 110/78 mmHg  Pulse 60  Ht 5\' 2"  (1.575 m)  Wt 179 lb 1.6 oz (81.239 kg)  BMI 32.75 kg/m2 Filed Weights   10/03/14 0900  Weight: 179 lb 1.6 oz (81.239 kg)  GENERAL:  Well appearing WF in NAD HEENT:  PERRL, EOMI, sclera are clear. Oropharynx is clear. NECK:  No jugular venous distention, carotid upstroke brisk and symmetric, no bruits, no thyromegaly or adenopathy LUNGS:  Clear to auscultation bilaterally CHEST:  Unremarkable  HEART:  RRR,  PMI  not displaced or sustained,S1 and S2 within normal limits, no S3, no S4: no clicks, no rubs, no murmurs ABD:  Soft, nontender. BS +, no masses or bruits. No hepatomegaly, no splenomegaly EXT:  2 + pulses throughout, no edema, no cyanosis no clubbing SKIN:  Warm and dry.  No rashes NEURO:  Alert and oriented x 3. Cranial nerves II through XII intact. PSYCH:  Cognitively intact    LABORATORY DATA: Ecg today shows NSR with T wave inversion consistent with anterolateral ischemia. Compared to prior Ecgs in 2014 these changes are new.  CLINICAL DATA: Pre cardiac catheterization, chest tightness, history of left mastectomy  EXAM: CHEST 2 VIEW  COMPARISON: Chest x-ray of 12/27/2012  FINDINGS: No active infiltrate or effusion is seen. The right hemidiaphragm appears chronically elevated. Mild cardiomegaly is stable. No acute bony abnormality is seen.  IMPRESSION: No active lung disease. Stable mild cardiomegaly and chronic elevation of the right hemidiaphragm.   Electronically Signed  By: Ivar Drape M.D.  On: 10/03/2014 14:18  Assessment / Plan: 1. New onset chest pain associated with new anterolateral T wave inversions consistent with unstable angina. We discussed further work up including stress testing versus cardiac cath. After discussion patient has opted to proceed with cardiac cath. The procedure and risks were reviewed including but not limited to death, myocardial infarction, stroke, arrythmias, bleeding, transfusion, emergency surgery, dye allergy, or renal dysfunction. The patient voices understanding and is agreeable to proceed. I have recommended taking ASA daily. Continue beta blocker and statin. Prescribed sl Ntg to use prn. Explained that is she has recurrent chest pain that doesn't respond to Ntg she is to call EMS and come to the hospital.   2. HTN controlled.  3. Hyperlipidemia. On statin.  4. Breast CA s/p mastectomy. On Tamoxifen.  5.  Hypothyroidism

## 2014-10-10 NOTE — Progress Notes (Signed)
Pt called nurse into room for bleeding at (R) radial site. Melanie Wright,R.N. Holding pressure. Pressure held x 81min. Bleeding resolved. Site soft with 2+ pulses. Dr. Martinique notified and stated Pt to be monitored until 1815. Ok to discharge at that time if no further bleeding. Pt and family kept informed

## 2014-10-10 NOTE — Interval H&P Note (Signed)
History and Physical Interval Note:  10/10/2014 2:13 PM  Shalin R Mathies  has presented today for surgery, with the diagnosis of abnormal ekg/cp  The various methods of treatment have been discussed with the patient and family. After consideration of risks, benefits and other options for treatment, the patient has consented to  Procedure(s): LEFT HEART CATHETERIZATION WITH CORONARY ANGIOGRAM (N/A) as a surgical intervention .  The patient's history has been reviewed, patient examined, no change in status, stable for surgery.  I have reviewed the patient's chart and labs.  Questions were answered to the patient's satisfaction.   Cath Lab Visit (complete for each Cath Lab visit)  Clinical Evaluation Leading to the Procedure:   ACS: Yes.    Non-ACS:    Anginal Classification: CCS III  Anti-ischemic medical therapy: Minimal Therapy (1 class of medications)  Non-Invasive Test Results: No non-invasive testing performed  Prior CABG: No previous CABG        Collier [facility]ina [facility] 10/10/2014 2:13 PM

## 2014-10-10 NOTE — Progress Notes (Addendum)
Paged Dr. Martinique about patients concern and request to be admitted.  Dr. Martinique said patient has no reason to be admitted and that the insurance will not cover it. After relaying this information to her then the patient said she would be fine and her husband would be able to listen out for her during the night though she was under the impression that her insurance would pay for an overnight stay which was communicated to Dr. Martinique. Patient was monitored and no further bleeding was noted.  Wheelchair was offered to be available about 10 minutes after Mr. Groll started walking to get car.  Patient was found by Mia, CNA walking in the main lobby with her family at her side before the 10 minute period of time to wait for husband to bring car to the main lobby.  The patients family stated she was fine and just wanted to go home to this nurse while in the main entrance and patient witnessed leaving with her husband with no incidence at around 1850.

## 2014-10-10 NOTE — Discharge Instructions (Signed)
Radial Site Care °Refer to this sheet in the next few weeks. These instructions provide you with information on caring for yourself after your procedure. Your caregiver may also give you more specific instructions. Your treatment has been planned according to current medical practices, but problems sometimes occur. Call your caregiver if you have any problems or questions after your procedure. °HOME CARE INSTRUCTIONS °· You may shower the day after the procedure. Remove the bandage (dressing) and gently wash the site with plain soap and water. Gently pat the site dry. °· Do not apply powder or lotion to the site. °· Do not submerge the affected site in water for 3 to 5 days. °· Inspect the site at least twice daily. °· Do not flex or bend the affected arm for 24 hours. °· No lifting over 5 pounds (2.3 kg) for 5 days after your procedure. °· Do not drive home if you are discharged the same day of the procedure. Have someone else drive you. °· You may drive 24 hours after the procedure unless otherwise instructed by your caregiver. °· Do not operate machinery or power tools for 24 hours. °· A responsible adult should be with you for the first 24 hours after you arrive home. °What to expect: °· Any bruising will usually fade within 1 to 2 weeks. °· Blood that collects in the tissue (hematoma) may be painful to the touch. It should usually decrease in size and tenderness within 1 to 2 weeks. °SEEK IMMEDIATE MEDICAL CARE IF: °· You have unusual pain at the radial site. °· You have redness, warmth, swelling, or pain at the radial site. °· You have drainage (other than a small amount of blood on the dressing). °· You have chills. °· You have a fever or persistent symptoms for more than 72 hours. °· You have a fever and your symptoms suddenly get worse. °· Your arm becomes pale, cool, tingly, or numb. °· You have heavy bleeding from the site. Hold pressure on the site. °Document Released: 09/26/2010 Document Revised:  11/16/2011 Document Reviewed: 09/26/2010 °ExitCare® Patient Information ©2015 [employer]. This information is not intended to replace advice given to you by your health care provider. Make sure you discuss any questions you have with your health care provider. ° °

## 2014-10-11 ENCOUNTER — Telehealth: Payer: Self-pay | Admitting: Cardiology

## 2014-10-11 NOTE — Telephone Encounter (Signed)
Received a call from patient.She stated she was upset with 2nd shift staff yesterday after cath.Stated 1st shift staff were excellent.Stated 2nd shift staff treated her and her family with no respect.Stated they made her feel bad.Stated cath site right wrist started bleeding and she had to stay longer.Stated she was afraid and no one seemed like they cared.Stated she was told she could get dressed and could leave at 6:30 pm.Stated nurse went to get a wheel chair.Stated nurse was gone along time over 30 mins.Stated she had to walk out because husband went to get car and he had already waited too long.Stated she has not slept all night afraid cath site would bleed again.Stated she is tired this morning.Stated discharge instructions were never gone over with her.Stated she was told by 1st shift staff to leave bandage on for 5 days and not to lift anything with arm for a couple of days.Advised she can remove bandage today.Advised no lifting over 5 lbs and no driving a car for 3 days.Advised if she notices swelling or pain in rt wrist to call back.Patient reassured.Advised to keep post hospital appointment with Cecilie Kicks NP 10/22/14 at 11:30 am.Advised to call sooner if needed.

## 2014-10-15 ENCOUNTER — Telehealth: Payer: Self-pay

## 2014-10-15 NOTE — Telephone Encounter (Signed)
Received call from patient she stated she just received a letter from her insurance denying payment for recent cath.Message sent to our insurance department.

## 2014-10-22 ENCOUNTER — Ambulatory Visit (INDEPENDENT_AMBULATORY_CARE_PROVIDER_SITE_OTHER): Payer: Medicare Other | Admitting: Cardiology

## 2014-10-22 ENCOUNTER — Encounter: Payer: Self-pay | Admitting: Cardiology

## 2014-10-22 VITALS — BP 110/62 | HR 76 | Ht 62.0 in | Wt 182.3 lb

## 2014-10-22 DIAGNOSIS — R079 Chest pain, unspecified: Secondary | ICD-10-CM

## 2014-10-22 NOTE — Patient Instructions (Signed)
Cecilie Kicks, NP, has recommended you follow-up with [place] as needed.

## 2014-10-22 NOTE — Progress Notes (Signed)
Cardiology Office Note   Date:  10/22/2014   ID:  Dorsie, Sethi 1946/09/06, MRN [id number]  PCP:  Wenda Low, MD  Cardiologist:  Dr. Martinique    Chief Complaint  Patient presents with  . post cath    post hospital, cath 10/10/14      History of Present Illness: Ariel Henderson is a 69 y.o. female who presents for follow up post cath.  She was seen at the request of Dr. Lysle Rubens for evaluation of chest pain. She has a history of HTN and hyperlipidemia. No prior history of heart disease. Recently she had an episode of chest pain at rest. She describes this as pain and squeezing across her anterior chest without radiation. No SOB. She felt hot. She took ASA and pain resolved after 45 minutes. She was seen by Dr. Lysle Rubens and EKG demonstrated anterior T wave inversion consistent with ischemia. Troponin level was normal.  She has had no further chest pain. No edema or palpitations.  She was seen and evaluated by Dr. Martinique and after discussion with pt. Cardiac cath was recommendation.  PT agreed and underwent cardiac cath 10/10/14.  She was found to have patent coronary arteries and normal EF.  She will follow up with PCP for further eval for non cardiac reasons for chest pain.  She has had 2 episodes since the cath.    Today no specific complaints though she tells me she was not happy with discharge from the short stay unit.  Her wrist bled once the pressure band was removed and she had wanted to stay the night but it was determined that the cath site was stable after holding pressure again, and she could go home.  She thought she should have stayed.  Her rt wrist arm did bruise and she has some bruising today but pulses are stable.  No hematoma.         Past Medical History  Diagnosis Date  . GERD (gastroesophageal reflux disease)   . Migraine headache   . Chronic anxiety   . Major depression   . Dyslipidemia   . Fibrocystic breast disease   . Complication of anesthesia     "slow to  wake up"  . Hypertension   . Seasonal allergies   . Peripheral vascular disease     varicose veins  . Hypothyroidism   . Multinodular goiter   . Thyroid cancer     per patient on 02/01/2013; dx in 55s  . Pneumonia     hx  . History of recurrent UTIs   . Cancer     cervical per patient  . Melanoma     per patient on 02/01/13; located on back; dx in 60s  . Breast cancer 12/29/12 bx    left breast ductal,high grade ductal ca in situ w/asoc comedo necrosis    Past Surgical History  Procedure Laterality Date  . Wrist fracture surgery    . Thyroidectomy  1991  . Breast lumpectomy      left   x 3, right x 2  . Abdominal hysterectomy    . Eye surgery Bilateral     cataract extraction with IOL  . Breast ductal system excision Left 12/29/2012    Procedure: EXCISION DUCTAL SYSTEM BREAST;  Surgeon: Haywood Lasso, MD;  Location: [facility];  Service: General;  Laterality: Left;  . Total mastectomy Left 02/09/2013    WITH TISSUE EXPANDER & BIOPSY  . Mastectomy w/ sentinel node biopsy Left  02/09/2013    Procedure: TOTAL MASTECTOMY WITH SENTINEL LYMPH NODE BIOPSY;  Surgeon: Haywood Lasso, MD;  Location: [facility];  Service: General;  Laterality: Left;  . Breast reconstruction with placement of tissue expander and flex hd (acellular hydrated dermis) Left 02/09/2013    Procedure: LEFT BREAST RECONSTRUCTION WITH PLACEMENT OF TISSUE EXPANDER ;  Surgeon: Crissie Reese, MD;  Location: [facility];  Service: Plastics;  Laterality: Left;  . Breast lumpectomy Left 12/2009    lower inner=fibrocystic changes/sclerosing adenosis  . Left heart catheterization with coronary angiogram N/A 10/10/2014    Procedure: LEFT HEART CATHETERIZATION WITH CORONARY ANGIOGRAM;  Surgeon: Peter M Martinique, MD;  Location: [facility];  Service: Cardiovascular;  Laterality: N/A;     Current Outpatient Prescriptions  Medication Sig Dispense Refill  . almotriptan (AXERT) 12.5 MG tablet Take 12.5 mg by mouth daily as needed for migraine.  may repeat in 2 hours if needed    . ALPRAZolam (XANAX) 0.25 MG tablet Take 1 tablet (0.25 mg total) by mouth at bedtime as needed for anxiety. 30 tablet 0  . aspirin 81 MG tablet Take 81 mg by mouth daily.    . cholecalciferol (VITAMIN D) 1000 UNITS tablet Take 1,000 Units by mouth daily.    . diazepam (VALIUM) 10 MG tablet Take 10 mg by mouth 2 (two) times daily as needed for anxiety.     Marland Kitchen levothyroxine (SYNTHROID, LEVOTHROID) 137 MCG tablet Take 137 mcg by mouth daily before breakfast.     . lovastatin (MEVACOR) 20 MG tablet Take 20 mg by mouth at bedtime.    . nitroGLYCERIN (NITROSTAT) 0.4 MG SL tablet Place 1 tablet (0.4 mg total) under the tongue every 5 (five) minutes as needed for chest pain. 25 tablet 6  . omeprazole (PRILOSEC) 40 MG capsule Take 40 mg by mouth daily.     . propranolol (INDERAL) 40 MG tablet Take 40 mg by mouth daily.    . ranitidine (ZANTAC) 75 MG tablet Take 75 mg by mouth 2 (two) times daily as needed for heartburn.     . tamoxifen (NOLVADEX) 20 MG tablet Take 20 mg by mouth daily.    Marland Kitchen venlafaxine (EFFEXOR) 75 MG tablet Take 75 mg by mouth 2 (two) times daily.    . vitamin E 1000 UNIT capsule Take 1,000 Units by mouth daily.     No current facility-administered medications for this visit.    Allergies:   Aspirin    Social History:  The patient  reports that she has never smoked. She has never used smokeless tobacco. She reports that she does not drink alcohol or use illicit drugs.   Family History:  The patient's family history includes Brain cancer (age of onset: 76) in her sister; Cancer (age of onset: 35) in her brother; Colon cancer (age of onset: 37) in her brother; Lung cancer (age of onset: 76) in her father; Ovarian cancer (age of onset: 59) in her sister; Prostate cancer (age of onset: 26) in her brother; Prostate cancer (age of onset: 26) in her brother; Prostate cancer (age of onset: 30) in her brother; Prostate cancer (age of onset: 30) in her brother  and brother.    ROS:  General:no colds or fevers, no weight changes CV:see HPI PUL:see HPI GI:no diarrhea constipation or melena, no indigestion MS:no joint pain, no claudication, rt wrist bruising    Wt Readings from Last 3 Encounters:  10/22/14 182 lb 4.8 oz (82.691 kg)  10/03/14 179 lb 1.6  oz (81.239 kg)  08/14/14 177 lb 3.2 oz (80.377 kg)     PHYSICAL EXAM: VS:  BP 110/62 mmHg  Pulse 76  Ht 5\' 2"  (1.575 m)  Wt 182 lb 4.8 oz (82.691 kg)  BMI 33.33 kg/m2 , BMI Body mass index is 33.33 kg/(m^2). General:Pleasant affect, NAD Skin:Warm and dry, brisk capillary refill Heart:S1S2 RRR without murmur, gallup, rub or click Lungs:clear without rales, rhonchi, or wheezes Ext:no lower ext edema, 2+ pedal pulses, 2+ radial pulses- + bruising on rt wrist fading Neuro:alert and oriented, MAE, follows commands, + facial symmetry   EKG:  EKG is NOT ordered today.   Recent Labs: 08/14/2014: ALT 17 10/03/2014: BUN 12; Creatinine 0.72; Hemoglobin 13.1; Platelets 333; Potassium 4.8; Sodium 138    Lipid Panel No results found for: CHOL, TRIG, HDL, CHOLHDL, VLDL, LDLCALC, LDLDIRECT     Other studies Reviewed: Additional studies/ records that were reviewed today include: cardiac cath.   ASSESSMENT AND PLAN:  1.  Chest pain, non cardiac to follow up with PCP she has appt in March.  2. S/P cardiac cath with normal coronary arteries.   She would only need follow up PRN, will leave to her PCP's determination.    Current medicines are reviewed with the patient today.  The patient has no concerns about meds. The following changes have been made:  See above  Labs/ tests ordered today include:see above  Disposition:   FU:  see above  Lennie Muckle, NP  10/22/2014 11:38 AM    [facility] [place], [place], [place] [address], [place] Phone: [phone]; Fax: [phone]

## 2015-02-18 ENCOUNTER — Ambulatory Visit: Payer: Medicare Other | Admitting: Hematology and Oncology

## 2015-03-03 NOTE — Assessment & Plan Note (Signed)
DCIS left breast diagnosed in 2014 status post mastectomy after the initial lumpectomy had positive margins. She underwent breast reconstruction. She has been on oral tamoxifen since July 2014  Tamoxifen Toxicities:  Breast Cancer Surveillance: 1. Breast exam 03/04/15: Normal 2. Mammogram 10/31/14: No abnormalities. Postsurgical changes. Breast Density Category C. I recommended that she get 3-D mammograms for surveillance. Discussed the differences between different breast density categories.

## 2015-03-04 ENCOUNTER — Encounter: Payer: Self-pay | Admitting: Hematology and Oncology

## 2015-03-04 ENCOUNTER — Telehealth: Payer: Self-pay | Admitting: Hematology and Oncology

## 2015-03-04 ENCOUNTER — Ambulatory Visit (HOSPITAL_BASED_OUTPATIENT_CLINIC_OR_DEPARTMENT_OTHER): Payer: Medicare Other | Admitting: Hematology and Oncology

## 2015-03-04 VITALS — BP 141/60 | HR 72 | Temp 97.4°F | Resp 18 | Ht 62.0 in | Wt 181.9 lb

## 2015-03-04 DIAGNOSIS — D0512 Intraductal carcinoma in situ of left breast: Secondary | ICD-10-CM | POA: Diagnosis not present

## 2015-03-04 DIAGNOSIS — Z171 Estrogen receptor negative status [ER-]: Secondary | ICD-10-CM | POA: Diagnosis not present

## 2015-03-04 DIAGNOSIS — C50112 Malignant neoplasm of central portion of left female breast: Secondary | ICD-10-CM

## 2015-03-04 DIAGNOSIS — Z79810 Long term (current) use of selective estrogen receptor modulators (SERMs): Secondary | ICD-10-CM

## 2015-03-04 NOTE — Progress Notes (Signed)
Patient Care Team: Wenda Low, MD as PCP - General (Internal Medicine)  DIAGNOSIS: Cancer of central portion of left female breast   Staging form: Breast, AJCC 7th Edition     Clinical: No stage assigned - Unsigned Prognostic indicators: ER= 0% PR = 0%      Pathologic: Stage 0 (Tis, N0, cM0) - Signed by Haywood Lasso, MD on 02/24/2013       Prognostic indicators: ER= 0% PR = 0%  SUMMARY OF ONCOLOGIC HISTORY:   Cancer of central portion of left female breast   12/29/2012 Surgery Left breast lumpectomy: High-grade DCIS with comedo necrosis extending to the margins ER 0% PR 0%   02/09/2013 Surgery Left breast mastectomy with reconstruction: DCIS high-grade with comedo necrosis 4 cm 2 SLN negative +1 additional lymph nodes negative, ER 0%, PR 0%   03/27/2013 -  Anti-estrogen oral therapy Tamoxifen 20 mg daily    CHIEF COMPLIANT: Follow-up on tamoxifen for DCIS  INTERVAL HISTORY: Ariel Henderson is a 69 year old with above-mentioned history of left breast DCIS treated with mastectomy followed by breast reconstruction currently on tamoxifen therapy. She was tamoxifen is being well tolerated without any problems or concerns. Denies any Hot flashes or myalgias. She did not get a mammogram this year because she is extremely stressed out about her daughter who is awaiting a liver transplant. Her daughter took lots of Tylenol medications which cause liver failure. She is very anxious about her daughter's health.  REVIEW OF SYSTEMS:   Constitutional: Denies fevers, chills or abnormal weight loss Eyes: Denies blurriness of vision Ears, nose, mouth, throat, and face: Denies mucositis or sore throat Respiratory: Denies cough, dyspnea or wheezes Cardiovascular: Denies palpitation, chest discomfort or lower extremity swelling Gastrointestinal:  Denies nausea, heartburn or change in bowel habits Skin: Denies abnormal skin rashes Lymphatics: Denies new lymphadenopathy or easy  bruising Neurological:Denies numbness, tingling or new weaknesses Behavioral/Psych: Mood is stable, no new changes  Breast:  denies any pain or lumps or nodules in either breasts All other systems were reviewed with the patient and are negative.  I have reviewed the past medical history, past surgical history, social history and family history with the patient and they are unchanged from previous note.  ALLERGIES:  is allergic to aspirin.  MEDICATIONS:  Current Outpatient Prescriptions  Medication Sig Dispense Refill  . almotriptan (AXERT) 12.5 MG tablet Take 12.5 mg by mouth daily as needed for migraine. may repeat in 2 hours if needed    . ALPRAZolam (XANAX) 0.25 MG tablet Take 1 tablet (0.25 mg total) by mouth at bedtime as needed for anxiety. 30 tablet 0  . aspirin 81 MG tablet Take 81 mg by mouth daily.    . cholecalciferol (VITAMIN D) 1000 UNITS tablet Take 1,000 Units by mouth daily.    . diazepam (VALIUM) 10 MG tablet Take 10 mg by mouth 2 (two) times daily as needed for anxiety.     Marland Kitchen levothyroxine (SYNTHROID, LEVOTHROID) 137 MCG tablet Take 137 mcg by mouth daily before breakfast.     . lovastatin (MEVACOR) 20 MG tablet Take 20 mg by mouth at bedtime.    . nitroGLYCERIN (NITROSTAT) 0.4 MG SL tablet Place 1 tablet (0.4 mg total) under the tongue every 5 (five) minutes as needed for chest pain. 25 tablet 6  . omeprazole (PRILOSEC) 40 MG capsule Take 40 mg by mouth daily.     Marland Kitchen PREVNAR 13 SUSP injection     . propranolol (INDERAL) 40 MG tablet  Take 40 mg by mouth daily.    . ranitidine (ZANTAC) 75 MG tablet Take 75 mg by mouth 2 (two) times daily as needed for heartburn.     . tamoxifen (NOLVADEX) 20 MG tablet Take 20 mg by mouth daily.    Marland Kitchen venlafaxine (EFFEXOR) 75 MG tablet Take 75 mg by mouth 2 (two) times daily.    . vitamin E 1000 UNIT capsule Take 1,000 Units by mouth daily.     No current facility-administered medications for this visit.    PHYSICAL EXAMINATION: ECOG  PERFORMANCE STATUS: 0 - Asymptomatic  Filed Vitals:   03/04/15 1049  BP: 141/60  Pulse: 72  Temp: 97.4 F (36.3 C)  Resp: 18   Filed Weights   03/04/15 1049  Weight: 181 lb 14.4 oz (82.509 kg)    GENERAL:alert, no distress and comfortable SKIN: skin color, texture, turgor are normal, no rashes or significant lesions EYES: normal, Conjunctiva are pink and non-injected, sclera clear OROPHARYNX:no exudate, no erythema and lips, buccal mucosa, and tongue normal  NECK: supple, thyroid normal size, non-tender, without nodularity LYMPH:  no palpable lymphadenopathy in the cervical, axillary or inguinal LUNGS: clear to auscultation and percussion with normal breathing effort HEART: regular rate & rhythm and no murmurs and no lower extremity edema ABDOMEN:abdomen soft, non-tender and normal bowel sounds Musculoskeletal:no cyanosis of digits and no clubbing  NEURO: alert & oriented x 3 with fluent speech, no focal motor/sensory deficits BREAST: No palpable masses or nodules in either right or left breasts. No palpable axillary supraclavicular or infraclavicular adenopathy no breast tenderness or nipple discharge. (exam performed in the presence of a chaperone)  LABORATORY DATA:  I have reviewed the data as listed   Chemistry      Component Value Date/Time   NA 138 10/03/2014 1034   NA 141 08/14/2014 1112   K 4.8 10/03/2014 1034   K 4.5 08/14/2014 1112   CL 103 10/03/2014 1034   CO2 24 10/03/2014 1034   CO2 24 08/14/2014 1112   BUN 12 10/03/2014 1034   BUN 17.1 08/14/2014 1112   CREATININE 0.72 10/03/2014 1034   CREATININE 0.8 08/14/2014 1112   CREATININE 0.67 02/07/2013 1258      Component Value Date/Time   CALCIUM 9.7 10/03/2014 1034   CALCIUM 8.7 08/14/2014 1112   ALKPHOS 46 08/14/2014 1112   AST 19 08/14/2014 1112   ALT 17 08/14/2014 1112   BILITOT 0.31 08/14/2014 1112       Lab Results  Component Value Date   WBC 11.8* 10/03/2014   HGB 13.1 10/03/2014   HCT 39.4  10/03/2014   MCV 87.4 10/03/2014   PLT 333 10/03/2014   NEUTROABS 6.1 10/03/2014   ASSESSMENT & PLAN:  Cancer of central portion of left female breast DCIS left breast diagnosed in 2014 status post mastectomy after the initial lumpectomy had positive margins. She underwent breast reconstruction. She has been on oral tamoxifen since July 2014  Tamoxifen Toxicities: No major toxicities of tamoxifen therapy denies any hot flashes or myalgias.  Breast Cancer Surveillance: 1. Breast exam 03/04/15: Normal 2. Mammogram 10/31/13: No abnormalities. Postsurgical changes. Breast Density Category C. I recommended that she get 3-D mammograms for surveillance. Discussed the differences between different breast density categories.  I discussed the patient to call the breast center and schedule a mammogram as soon as she is able to.   Survivorship: Discussed the importance of physical exercise in decreasing the likelihood of breast cancer recurrence. Recommended 30 mins daily  6 days a week of either brisk walking or cycling or swimming. Encouraged patient to eat more fruits and vegetables and decrease red meat.    No orders of the defined types were placed in this encounter.   The patient has a good understanding of the overall plan. she agrees with it. she will call with any problems that may develop before the next visit here.   Rulon Eisenmenger, MD

## 2015-03-04 NOTE — Telephone Encounter (Signed)
Gave avs & calendar for December. °

## 2015-05-31 ENCOUNTER — Other Ambulatory Visit: Payer: Self-pay | Admitting: Internal Medicine

## 2015-05-31 DIAGNOSIS — E039 Hypothyroidism, unspecified: Secondary | ICD-10-CM

## 2015-06-03 ENCOUNTER — Ambulatory Visit
Admission: RE | Admit: 2015-06-03 | Discharge: 2015-06-03 | Disposition: A | Discharge status: Home / Self Care | Payer: Medicare Other | Source: Ambulatory Visit | Attending: Internal Medicine | Admitting: Internal Medicine

## 2015-06-03 DIAGNOSIS — E039 Hypothyroidism, unspecified: Secondary | ICD-10-CM

## 2015-09-03 ENCOUNTER — Ambulatory Visit (HOSPITAL_BASED_OUTPATIENT_CLINIC_OR_DEPARTMENT_OTHER): Payer: Medicare Other | Admitting: Hematology and Oncology

## 2015-09-03 ENCOUNTER — Encounter: Payer: Self-pay | Admitting: Hematology and Oncology

## 2015-09-03 ENCOUNTER — Telehealth: Payer: Self-pay | Admitting: Hematology and Oncology

## 2015-09-03 VITALS — BP 156/67 | HR 71 | Temp 97.9°F | Resp 18 | Ht 62.0 in | Wt 168.7 lb

## 2015-09-03 DIAGNOSIS — Z79810 Long term (current) use of selective estrogen receptor modulators (SERMs): Secondary | ICD-10-CM | POA: Diagnosis not present

## 2015-09-03 DIAGNOSIS — C50112 Malignant neoplasm of central portion of left female breast: Secondary | ICD-10-CM | POA: Diagnosis not present

## 2015-09-03 DIAGNOSIS — Z171 Estrogen receptor negative status [ER-]: Secondary | ICD-10-CM | POA: Diagnosis not present

## 2015-09-03 MED ORDER — TAMOXIFEN CITRATE 20 MG PO TABS: 20.0000 mg | ORAL_TABLET | Freq: Every day | ORAL | Status: DC

## 2015-09-03 NOTE — Telephone Encounter (Signed)
Appointments made and patient request 09/2016  [facility]

## 2015-09-03 NOTE — Progress Notes (Signed)
Patient Care Team: Wenda Low, MD as PCP - General (Internal Medicine)  DIAGNOSIS: Cancer of central portion of left female breast [facility])   Staging form: Breast, AJCC 7th Edition     Clinical: No stage assigned - Unsigned       Prognostic indicators: ER= 0% PR = 0%      Pathologic: Stage 0 (Tis, N0, cM0) - Signed by Haywood Lasso, MD on 02/24/2013       Prognostic indicators: ER= 0% PR = 0%  SUMMARY OF ONCOLOGIC HISTORY:   Cancer of central portion of left female breast ([place])   12/29/2012 Surgery Left breast lumpectomy: High-grade DCIS with comedo necrosis extending to the margins ER 0% PR 0%   02/09/2013 Surgery Left breast mastectomy with reconstruction: DCIS high-grade with comedo necrosis 4 cm 2 SLN negative +1 additional lymph nodes negative, ER 0%, PR 0%   03/27/2013 -  Anti-estrogen oral therapy Tamoxifen 20 mg daily    CHIEF COMPLIANT:  Follow-up on tamoxifen for left breast cancer  INTERVAL HISTORY: Ariel Henderson is a  69 year old with above-mentioned left breast cancer currently on adjuvant hormonal therapy with tamoxifen.  She appears to be tolerating it very well without any major problems or concerns. She does have hot flashes.  REVIEW OF SYSTEMS:   Constitutional: Denies fevers, chills or abnormal weight loss Eyes: Denies blurriness of vision Ears, nose, mouth, throat, and face: Denies mucositis or sore throat Respiratory: Denies cough, dyspnea or wheezes Cardiovascular: Denies palpitation, chest discomfort Gastrointestinal:  Denies nausea, heartburn or change in bowel habits Skin: Denies abnormal skin rashes Lymphatics: Denies new lymphadenopathy or easy bruising Neurological:Denies numbness, tingling or new weaknesses Behavioral/Psych: Mood is stable, no new changes  Extremities: No lower extremity edema Breast:  denies any pain or lumps or nodules in either breasts All other systems were reviewed with the patient and are negative.  I have reviewed the  past medical history, past surgical history, social history and family history with the patient and they are unchanged from previous note.  ALLERGIES:  is allergic to aspirin.  MEDICATIONS:  Current Outpatient Prescriptions  Medication Sig Dispense Refill  . almotriptan (AXERT) 12.5 MG tablet Take 12.5 mg by mouth daily as needed for migraine. may repeat in 2 hours if needed    . ALPRAZolam (XANAX) 0.25 MG tablet Take 1 tablet (0.25 mg total) by mouth at bedtime as needed for anxiety. 30 tablet 0  . aspirin 81 MG tablet Take 81 mg by mouth daily.    . cholecalciferol (VITAMIN D) 1000 UNITS tablet Take 1,000 Units by mouth daily.    . diazepam (VALIUM) 10 MG tablet Take 10 mg by mouth 2 (two) times daily as needed for anxiety.     Marland Kitchen levothyroxine (SYNTHROID, LEVOTHROID) 137 MCG tablet Take 137 mcg by mouth daily before breakfast.     . lovastatin (MEVACOR) 20 MG tablet Take 20 mg by mouth at bedtime.    . nitroGLYCERIN (NITROSTAT) 0.4 MG SL tablet Place 1 tablet (0.4 mg total) under the tongue every 5 (five) minutes as needed for chest pain. 25 tablet 6  . omeprazole (PRILOSEC) 40 MG capsule Take 40 mg by mouth daily.     Marland Kitchen PREVNAR 13 SUSP injection     . propranolol (INDERAL) 40 MG tablet Take 40 mg by mouth daily.    . ranitidine (ZANTAC) 75 MG tablet Take 75 mg by mouth 2 (two) times daily as needed for heartburn.     Marland Kitchen  tamoxifen (NOLVADEX) 20 MG tablet Take 20 mg by mouth daily.    Marland Kitchen venlafaxine (EFFEXOR) 75 MG tablet Take 75 mg by mouth 2 (two) times daily.    . vitamin E 1000 UNIT capsule Take 1,000 Units by mouth daily.     No current facility-administered medications for this visit.    PHYSICAL EXAMINATION: ECOG PERFORMANCE STATUS: 1 - Symptomatic but completely ambulatory  Filed Vitals:   09/03/15 1046  BP: 156/67  Pulse: 71  Temp: 97.9 F (36.6 C)  Resp: 18   Filed Weights   09/03/15 1046  Weight: 168 lb 11.2 oz (76.522 kg)    GENERAL:alert, no distress and  comfortable SKIN: skin color, texture, turgor are normal, no rashes or significant lesions EYES: normal, Conjunctiva are pink and non-injected, sclera clear OROPHARYNX:no exudate, no erythema and lips, buccal mucosa, and tongue normal  NECK: supple, thyroid normal size, non-tender, without nodularity LYMPH:  no palpable lymphadenopathy in the cervical, axillary or inguinal LUNGS: clear to auscultation and percussion with normal breathing effort HEART: regular rate & rhythm and no murmurs and no lower extremity edema ABDOMEN:abdomen soft, non-tender and normal bowel sounds MUSCULOSKELETAL:no cyanosis of digits and no clubbing  NEURO: alert & oriented x 3 with fluent speech, no focal motor/sensory deficits EXTREMITIES: No lower extremity edema  LABORATORY DATA:  I have reviewed the data as listed   Chemistry      Component Value Date/Time   NA 138 10/03/2014 1034   NA 141 08/14/2014 1112   K 4.8 10/03/2014 1034   K 4.5 08/14/2014 1112   CL 103 10/03/2014 1034   CO2 24 10/03/2014 1034   CO2 24 08/14/2014 1112   BUN 12 10/03/2014 1034   BUN 17.1 08/14/2014 1112   CREATININE 0.72 10/03/2014 1034   CREATININE 0.8 08/14/2014 1112   CREATININE 0.67 02/07/2013 1258      Component Value Date/Time   CALCIUM 9.7 10/03/2014 1034   CALCIUM 8.7 08/14/2014 1112   ALKPHOS 46 08/14/2014 1112   AST 19 08/14/2014 1112   ALT 17 08/14/2014 1112   BILITOT 0.31 08/14/2014 1112       Lab Results  Component Value Date   WBC 11.8* 10/03/2014   HGB 13.1 10/03/2014   HCT 39.4 10/03/2014   MCV 87.4 10/03/2014   PLT 333 10/03/2014   NEUTROABS 6.1 10/03/2014   ASSESSMENT & PLAN:  Cancer of central portion of left female breast DCIS left breast diagnosed in 2014 status post mastectomy after the initial lumpectomy had positive margins. She underwent breast reconstruction. She has been on oral tamoxifen since July 2014  Tamoxifen Toxicities: No major toxicities of tamoxifen therapy denies any hot  flashes or myalgias.  Breast Cancer Surveillance: 1. Breast exam 09/03/15: Normal , tenderness in the left axilla in the  Muscles. No palpable nodules or lumps. 2. Mammogram 10/31/14: No abnormalities. Postsurgical changes. Breast Density Category C. I recommended that she get 3-D mammograms for surveillance. Discussed the differences between different breast density categories.  Since the patient is ER/PR negative, it is not essential for her to receive antiestrogen therapy. It is considered optional by [place] and guidelines.   Survivorship: Discussed the importance of physical exercise in decreasing the likelihood of breast cancer recurrence. Recommended 30 mins daily 6 days a week of either brisk walking or cycling or swimming. Encouraged patient to eat more fruits and vegetables and decrease red meat.   Return to clinic in 1 year for follow-up   No orders of  the defined types were placed in this encounter.   The patient has a good understanding of the overall plan. she agrees with it. she will call with any problems that may develop before the next visit here.   Rulon Eisenmenger, MD 09/03/2015

## 2015-09-03 NOTE — Assessment & Plan Note (Signed)
DCIS left breast diagnosed in 2014 status post mastectomy after the initial lumpectomy had positive margins. She underwent breast reconstruction. She has been on oral tamoxifen since July 2014  Tamoxifen Toxicities: No major toxicities of tamoxifen therapy denies any hot flashes or myalgias.  Breast Cancer Surveillance: 1. Breast exam 09/03/15: Normal 2. Mammogram 10/31/13: No abnormalities. Postsurgical changes. Breast Density Category C. I recommended that she get 3-D mammograms for surveillance. Discussed the differences between different breast density categories.   Survivorship: Discussed the importance of physical exercise in decreasing the likelihood of breast cancer recurrence. Recommended 30 mins daily 6 days a week of either brisk walking or cycling or swimming. Encouraged patient to eat more fruits and vegetables and decrease red meat.   Return to clinic in 1 year for follow-up

## 2015-11-15 ENCOUNTER — Other Ambulatory Visit: Payer: Self-pay

## 2015-11-15 DIAGNOSIS — C50112 Malignant neoplasm of central portion of left female breast: Secondary | ICD-10-CM

## 2015-11-15 MED ORDER — TAMOXIFEN CITRATE 20 MG PO TABS: 20.0000 mg | ORAL_TABLET | Freq: Every day | ORAL | Status: DC

## 2016-05-19 ENCOUNTER — Other Ambulatory Visit: Payer: Self-pay | Admitting: Internal Medicine

## 2016-05-19 DIAGNOSIS — Z1231 Encounter for screening mammogram for malignant neoplasm of breast: Secondary | ICD-10-CM

## 2016-06-25 ENCOUNTER — Ambulatory Visit
Admission: RE | Admit: 2016-06-25 | Discharge: 2016-06-25 | Disposition: A | Discharge status: Home / Self Care | Payer: Medicare Other | Source: Ambulatory Visit | Attending: Internal Medicine | Admitting: Internal Medicine

## 2016-06-25 DIAGNOSIS — Z1231 Encounter for screening mammogram for malignant neoplasm of breast: Secondary | ICD-10-CM

## 2016-08-31 ENCOUNTER — Other Ambulatory Visit: Payer: Self-pay | Admitting: Hematology and Oncology

## 2016-08-31 DIAGNOSIS — C50112 Malignant neoplasm of central portion of left female breast: Secondary | ICD-10-CM

## 2016-09-09 NOTE — Assessment & Plan Note (Signed)
DCIS left breast diagnosed in 2014 status post mastectomy after the initial lumpectomy had positive margins. She underwent breast reconstruction. She has been on oral tamoxifen since July 2014  Tamoxifen Toxicities: No major toxicities of tamoxifen therapy denies any hot flashes or myalgias.  Breast Cancer Surveillance: 1. Breast exam 09/10/16: Normal , tenderness in the left axilla in the  Muscles. No palpable nodules or lumps. 2. Mammogram 06/26/16: No abnormalities. Postsurgical changes. Breast Density Category B. I recommended that she get 3-D mammograms for surveillance. Discussed the differences between different breast density categories.  Since the patient is ER/PR negative, it is not essential for her to receive antiestrogen therapy. It is considered optional by [facility] and guidelines.   Survivorship: Discussed the importance of physical exercise in decreasing the likelihood of breast cancer recurrence. Recommended 30 mins daily 6 days a week of either brisk walking or cycling or swimming. Encouraged patient to eat more fruits and vegetables and decrease red meat.   Return to clinic in 1 year for follow-up

## 2016-09-10 ENCOUNTER — Ambulatory Visit (HOSPITAL_BASED_OUTPATIENT_CLINIC_OR_DEPARTMENT_OTHER): Payer: Medicare Other | Admitting: Hematology and Oncology

## 2016-09-10 ENCOUNTER — Ambulatory Visit (HOSPITAL_BASED_OUTPATIENT_CLINIC_OR_DEPARTMENT_OTHER): Payer: Medicare Other

## 2016-09-10 ENCOUNTER — Encounter: Payer: Self-pay | Admitting: Hematology and Oncology

## 2016-09-10 DIAGNOSIS — Z170 Estrogen receptor positive status [ER+]: Secondary | ICD-10-CM

## 2016-09-10 DIAGNOSIS — Z79810 Long term (current) use of selective estrogen receptor modulators (SERMs): Secondary | ICD-10-CM | POA: Diagnosis not present

## 2016-09-10 DIAGNOSIS — C50112 Malignant neoplasm of central portion of left female breast: Secondary | ICD-10-CM

## 2016-09-10 LAB — COMPREHENSIVE METABOLIC PANEL
ALK PHOS: 68 U/L (ref 40–150)
ALT: 17 U/L (ref 0–55)
AST: 20 U/L (ref 5–34)
Albumin: 3.6 g/dL (ref 3.5–5.0)
Anion Gap: 9 mEq/L (ref 3–11)
BUN: 18.9 mg/dL (ref 7.0–26.0)
CO2: 24 mEq/L (ref 22–29)
Calcium: 9.2 mg/dL (ref 8.4–10.4)
Chloride: 107 mEq/L (ref 98–109)
Creatinine: 0.8 mg/dL (ref 0.6–1.1)
EGFR: 73 mL/min/{1.73_m2} — AB (ref >90)
Glucose: 97 mg/dl (ref 70–140)
POTASSIUM: 4.6 meq/L (ref 3.5–5.1)
Sodium: 140 mEq/L (ref 136–145)
Total Bilirubin: 0.25 mg/dL (ref 0.20–1.20)
Total Protein: 6.7 g/dL (ref 6.4–8.3)

## 2016-09-10 LAB — CBC WITH DIFFERENTIAL/PLATELET
BASO%: 0.5 % (ref 0.0–2.0)
BASOS ABS: 0.1 10*3/uL (ref 0.0–0.1)
EOS ABS: 0.4 10*3/uL (ref 0.0–0.5)
EOS%: 2.9 % (ref 0.0–7.0)
HEMATOCRIT: 39.1 % (ref 34.8–46.6)
HGB: 12.8 g/dL (ref 11.6–15.9)
LYMPH#: 4.4 10*3/uL — AB (ref 0.9–3.3)
LYMPH%: 36.1 % (ref 14.0–49.7)
MCH: 28.6 pg (ref 25.1–34.0)
MCHC: 32.7 g/dL (ref 31.5–36.0)
MCV: 87.5 fL (ref 79.5–101.0)
MONO#: 0.9 10*3/uL (ref 0.1–0.9)
MONO%: 7 % (ref 0.0–14.0)
NEUT#: 6.6 10*3/uL — ABNORMAL HIGH (ref 1.5–6.5)
NEUT%: 53.5 % (ref 38.4–76.8)
Platelets: 265 10*3/uL (ref 145–400)
RBC: 4.47 10*6/uL (ref 3.70–5.45)
RDW: 14.1 % (ref 11.2–14.5)
WBC: 12.2 10*3/uL — ABNORMAL HIGH (ref 3.9–10.3)

## 2016-09-10 MED ORDER — CALCIUM CARBONATE ANTACID 500 MG PO CHEW: 1000.0000 mg | CHEWABLE_TABLET | Freq: Every day | ORAL | Status: DC

## 2016-09-10 NOTE — Progress Notes (Signed)
Patient Care Team: Wenda Low, MD as PCP - General (Internal Medicine)  DIAGNOSIS:  Encounter Diagnosis  Name Primary?  . Malignant neoplasm of central portion of left breast in female, estrogen receptor positive ([place])     SUMMARY OF ONCOLOGIC HISTORY:   Cancer of central portion of left female breast ([place])   12/29/2012 Surgery    Left breast lumpectomy: High-grade DCIS with comedo necrosis extending to the margins ER 0% PR 0%      02/09/2013 Surgery    Left breast mastectomy with reconstruction: DCIS high-grade with comedo necrosis 4 cm 2 SLN negative +1 additional lymph nodes negative, ER 0%, PR 0%      03/27/2013 -  Anti-estrogen oral therapy    Tamoxifen 20 mg daily       CHIEF COMPLIANT: Surveillance of breast cancer on tamoxifen  INTERVAL HISTORY: Ariel Henderson is a 71 year old with above-mentioned history of left breast cancer treated with mastectomy and is currently on tamoxifen therapy. She does have intermittent hot flashes and muscle cramps and aches. But she is tolerating it otherwise fairly well. She denies any lumps or nodules in breast. The left breast reconstruction she has some itching and occasional pain like symptoms underneath the breast. She has been through a lot of emotional trauma from her daughter went in liver transplant. Her grandson age 88 had drowned.   REVIEW OF SYSTEMS:   Constitutional: Denies fevers, chills or abnormal weight loss Eyes: Denies blurriness of vision Ears, nose, mouth, throat, and face: Denies mucositis or sore throat Respiratory: Denies cough, dyspnea or wheezes Cardiovascular: Denies palpitation, chest discomfort Gastrointestinal:  Denies nausea, heartburn or change in bowel habits Skin: Denies abnormal skin rashes Lymphatics: Denies new lymphadenopathy or easy bruising Neurological:Denies numbness, tingling or new weaknesses Behavioral/Psych: Depression  Extremities: No lower extremity edema Breast: Left breast  reconstructed, right breast no palpable lumps or nodules All other systems were reviewed with the patient and are negative.  I have reviewed the past medical history, past surgical history, social history and family history with the patient and they are unchanged from previous note.  ALLERGIES:  is allergic to aspirin.  MEDICATIONS:  Current Outpatient Prescriptions  Medication Sig Dispense Refill  . Calcium Carbonate-Vit D-Min (CALCIUM 1200 PO) Take 1,000 mg by mouth daily.     Marland Kitchen almotriptan (AXERT) 12.5 MG tablet Take 12.5 mg by mouth daily as needed for migraine. may repeat in 2 hours if needed    . ALPRAZolam (XANAX) 0.25 MG tablet Take 1 tablet (0.25 mg total) by mouth at bedtime as needed for anxiety. 30 tablet 0  . aspirin 81 MG tablet Take 81 mg by mouth daily.    . cholecalciferol (VITAMIN D) 1000 UNITS tablet Take 1,000 Units by mouth daily.    . diazepam (VALIUM) 10 MG tablet Take 10 mg by mouth 2 (two) times daily as needed for anxiety.     Marland Kitchen levothyroxine (SYNTHROID, LEVOTHROID) 137 MCG tablet Take 137 mcg by mouth daily before breakfast.     . lovastatin (MEVACOR) 20 MG tablet Take 20 mg by mouth at bedtime.    . nitroGLYCERIN (NITROSTAT) 0.4 MG SL tablet Place 1 tablet (0.4 mg total) under the tongue every 5 (five) minutes as needed for chest pain. 25 tablet 6  . omeprazole (PRILOSEC) 40 MG capsule Take 40 mg by mouth daily.     Marland Kitchen PREVNAR 13 SUSP injection     . propranolol (INDERAL) 40 MG tablet Take 40 mg by  mouth daily.    . ranitidine (ZANTAC) 75 MG tablet Take 75 mg by mouth 2 (two) times daily as needed for heartburn.     . tamoxifen (NOLVADEX) 20 MG tablet TAKE 1 TABLET BY MOUTH  DAILY 90 tablet 1  . venlafaxine (EFFEXOR) 75 MG tablet Take 75 mg by mouth 2 (two) times daily.     No current facility-administered medications for this visit.     PHYSICAL EXAMINATION: ECOG PERFORMANCE STATUS: 1 - Symptomatic but completely ambulatory  Vitals:   09/10/16 1020  BP:  137/64  Pulse: 68  Resp: 18  Temp: 97.6 F (36.4 C)   Filed Weights   09/10/16 1020  Weight: 181 lb (82.1 kg)    GENERAL:alert, no distress and comfortable SKIN: skin color, texture, turgor are normal, no rashes or significant lesions EYES: normal, Conjunctiva are pink and non-injected, sclera clear OROPHARYNX:no exudate, no erythema and lips, buccal mucosa, and tongue normal  NECK: supple, thyroid normal size, non-tender, without nodularity LYMPH:  no palpable lymphadenopathy in the cervical, axillary or inguinal LUNGS: clear to auscultation and percussion with normal breathing effort HEART: regular rate & rhythm and no murmurs and no lower extremity edema ABDOMEN:abdomen soft, non-tender and normal bowel sounds MUSCULOSKELETAL:no cyanosis of digits and no clubbing  NEURO: alert & oriented x 3 with fluent speech, no focal motor/sensory deficits EXTREMITIES: No lower extremity edema BREAST: No palpable lumps or nodules in the right breast and axilla, no palpable lumps or nodules in the left reconstructed breast or axilla (exam performed in the presence of a chaperone)  LABORATORY DATA:  I have reviewed the data as listed   Chemistry      Component Value Date/Time   NA 138 10/03/2014 1034   NA 141 08/14/2014 1112   K 4.8 10/03/2014 1034   K 4.5 08/14/2014 1112   CL 103 10/03/2014 1034   CO2 24 10/03/2014 1034   CO2 24 08/14/2014 1112   BUN 12 10/03/2014 1034   BUN 17.1 08/14/2014 1112   CREATININE 0.72 10/03/2014 1034   CREATININE 0.8 08/14/2014 1112      Component Value Date/Time   CALCIUM 9.7 10/03/2014 1034   CALCIUM 8.7 08/14/2014 1112   ALKPHOS 46 08/14/2014 1112   AST 19 08/14/2014 1112   ALT 17 08/14/2014 1112   BILITOT 0.31 08/14/2014 1112       Lab Results  Component Value Date   WBC 12.2 (H) 09/10/2016   HGB 12.8 09/10/2016   HCT 39.1 09/10/2016   MCV 87.5 09/10/2016   PLT 265 09/10/2016   NEUTROABS 6.6 (H) 09/10/2016    ASSESSMENT & PLAN:    Cancer of central portion of left female breast DCIS left breast diagnosed in 2014 status post mastectomy after the initial lumpectomy had positive margins. She underwent breast reconstruction. She has been on oral tamoxifen since July 2014  Tamoxifen Toxicities: 1. Intermittent hot flashes 2. intermittent muscle cramps  Breast Cancer Surveillance: 1. Breast exam 09/10/16: Normal , tenderness in the left axilla in the  Muscles. No palpable nodules or lumps. 2. Mammogram 06/26/16: No abnormalities. Postsurgical changes. Breast Density Category B. I recommended that she get 3-D mammograms for surveillance. Discussed the differences between different breast density categories.   Patient appears to have very high anxiety level regarding her general health especially since she has multiple family members died of cancers. She wanted me to obtain blood work today. I I explained to her the blood work does not tell us anything  about breast cancer. In spite of this she is adamant that she get blood work. So we obtain a CBC and CMP today. CBC revealed a mild leukocytosis related to slightly increased lymphocyte count and slight increase of neutrophils. We will watch this and monitor this once a year.    Survivorship: Discussed the importance of physical exercise in decreasing the likelihood of breast cancer recurrence. Recommended 30 mins daily 6 days a week of either brisk walking or cycling or swimming. Encouraged patient to eat more fruits and vegetables and decrease red meat.   Return to clinic in 1 year for follow-up with labs  Orders Placed This Encounter  Procedures  . CBC with Differential    Standing Status:   Future    Number of Occurrences:   1    Standing Expiration Date:   09/10/2017  . Comprehensive metabolic panel    Standing Status:   Future    Number of Occurrences:   1    Standing Expiration Date:   09/10/2017   The patient has a good understanding of the overall plan. she  agrees with it. she will call with any problems that may develop before the next visit here.   Rulon Eisenmenger, MD 09/10/16

## 2016-11-09 NOTE — Progress Notes (Signed)
Cardiology Office Note   Date:  11/10/2016   ID:  Ariel Henderson, DOB Dec 21, 1945, MRN [id number]  PCP:  Ariel Low, MD  Cardiologist:  Dr. Martinique    Chief Complaint  Patient presents with  . Chest Pain      History of Present Illness: Ariel Henderson is a 71 y.o. female who presents for chest pain.  Pt originally seen for evaluation of chest pain. She has a history of HTN and hyperlipidemia. No prior history of heart disease. In 10/10/14 she had an episode of chest pain at rest. She described this as pain and squeezing across her anterior chest without radiation. No SOB. She felt hot. She took ASA and pain resolved after 45 minutes. She was seen by Dr. Lysle Rubens and EKG demonstrated anterior T wave inversion consistent with ischemia. Troponin level was normal.  She was seen and evaluated by Dr. Martinique and after discussion with pt.-- Cardiac cath was recommendation.  PT agreed and underwent cardiac cath 10/10/14.  She was found to have patent coronary arteries and normal EF.  She was seen in follow up and reassured..  She has had 2 episodes of pain since the cath.  She was to follow with her PCP and see Ariel Henderson PRN.  Yesterday she presented with complaint of not knowing what her heart cath revealed and chest pain.    Today she tells me her chest pain began about 3 weeks ago and it comes and goes with or without activity.  Lasts about 30 min when it occurs.  Associated with SOB and weakness. She also feels "sick" when it occurs.  .No rapid HR, no palpitations.  She is very worried.  She still has anger about how she was treated in [facility] stay at [facility].  als stressed with her daughter receiving liver transplant in July but doing well now and her husband with CHF.  No edema or orthopnea.   She also has hx or gastric ulcers. Now on zantac.  She has taken NTG sl with relief, has only taken once.   Past Medical History:  Diagnosis Date  . Breast cancer ([place]) 12/29/12 bx   left breast ductal,high grade  ductal ca in situ w/asoc comedo necrosis  . Cancer ([place])    cervical per patient  . Chronic anxiety   . Complication of anesthesia    "slow to wake up"  . Dyslipidemia   . Fibrocystic breast disease   . GERD (gastroesophageal reflux disease)   . History of recurrent UTIs   . Hypertension   . Hypothyroidism   . Major depression   . Melanoma ([place])    per patient on 02/01/13; located on back; dx in 46s  . Migraine headache   . Multinodular goiter   . Peripheral vascular disease ([facility])    varicose veins  . Pneumonia    hx  . Seasonal allergies   . Thyroid cancer ([place])    per patient on 02/01/2013; dx in 75s    Past Surgical History:  Procedure Laterality Date  . ABDOMINAL HYSTERECTOMY    . BREAST DUCTAL SYSTEM EXCISION Left 12/29/2012   Procedure: EXCISION DUCTAL SYSTEM BREAST;  Surgeon: Haywood Lasso, MD;  Location: [facility];  Service: General;  Laterality: Left;  . BREAST LUMPECTOMY     left   x 3, right x 2  . BREAST LUMPECTOMY Left 12/2009   lower inner=fibrocystic changes/sclerosing adenosis  . BREAST RECONSTRUCTION WITH PLACEMENT OF TISSUE EXPANDER AND FLEX HD (ACELLULAR HYDRATED  DERMIS) Left 02/09/2013   Procedure: LEFT BREAST RECONSTRUCTION WITH PLACEMENT OF TISSUE EXPANDER ;  Surgeon: Ariel Reese, MD;  Location: [facility];  Service: Plastics;  Laterality: Left;  . EYE SURGERY Bilateral    cataract extraction with IOL  . LEFT HEART CATHETERIZATION WITH CORONARY ANGIOGRAM N/A 10/10/2014   Procedure: LEFT HEART CATHETERIZATION WITH CORONARY ANGIOGRAM;  Surgeon: Peter M Martinique, MD;  Location: [facility] CATH LAB;  Service: Cardiovascular;  Laterality: N/A;  . MASTECTOMY W/ SENTINEL NODE BIOPSY Left 02/09/2013   Procedure: TOTAL MASTECTOMY WITH SENTINEL LYMPH NODE BIOPSY;  Surgeon: Haywood Lasso, MD;  Location: [facility];  Service: General;  Laterality: Left;  . THYROIDECTOMY  1991  . TOTAL MASTECTOMY Left 02/09/2013   WITH TISSUE EXPANDER & BIOPSY  . WRIST FRACTURE SURGERY        Current Outpatient Prescriptions  Medication Sig Dispense Refill  . almotriptan (AXERT) 12.5 MG tablet Take 12.5 mg by mouth daily as needed for migraine. may repeat in 2 hours if needed    . aspirin 81 MG tablet Take 81 mg by mouth daily.    . Calcium Carbonate-Vit D-Min (CALCIUM 1200 PO) Take 1,000 mg by mouth daily.     . diazepam (VALIUM) 10 MG tablet Take 10 mg by mouth 2 (two) times daily as needed for anxiety.     Marland Kitchen levothyroxine (SYNTHROID, LEVOTHROID) 137 MCG tablet Take 137 mcg by mouth daily before breakfast.     . lovastatin (MEVACOR) 20 MG tablet Take 20 mg by mouth at bedtime.    . nitroGLYCERIN (NITROSTAT) 0.4 MG SL tablet Place 1 tablet (0.4 mg total) under the tongue every 5 (five) minutes as needed for chest pain. 25 tablet 6  . omeprazole (PRILOSEC) 40 MG capsule Take 40 mg by mouth daily.     Marland Kitchen PREVNAR 13 SUSP injection     . propranolol (INDERAL) 40 MG tablet Take 40 mg by mouth daily.    . tamoxifen (NOLVADEX) 20 MG tablet TAKE 1 TABLET BY MOUTH  DAILY 90 tablet 1  . venlafaxine (EFFEXOR) 75 MG tablet Take 75 mg by mouth 2 (two) times daily.    . pantoprazole (PROTONIX) 40 MG tablet Take 1 tablet (40 mg total) by mouth daily. 30 tablet 11   No current facility-administered medications for this visit.     Allergies:   Aspirin GI distress at higher doses   Social History:  The patient  reports that she has never smoked. She has never used smokeless tobacco. She reports that she does not drink alcohol or use drugs.   Family History:  The patient's family history includes Brain cancer (age of onset: 82) in her sister; Cancer (age of onset: 78) in her brother; Colon cancer (age of onset: 16) in her brother; Lung cancer (age of onset: 73) in her father; Ovarian cancer (age of onset: 89) in her sister; Prostate cancer (age of onset: 67) in her brother; Prostate cancer (age of onset: 66) in her brother; Prostate cancer (age of onset: 67) in her brother; Prostate cancer  (age of onset: 70) in her brother and brother.    ROS:  General:no colds or fevers, + weight changes with gain in last 2 years Skin:no rashes or ulcers HEENT:no blurred vision, no congestion CV:see HPI PUL:see HPI GI:no diarrhea constipation or melena, no indigestion GU:no hematuria, no dysuria MS:no joint pain, no claudication Neuro:no syncope, no lightheadedness Endo:no diabetes, no thyroid disease  Wt Readings from Last 3 Encounters:  11/10/16  184 lb 6.4 oz (83.6 kg)  09/10/16 181 lb (82.1 kg)  09/03/15 168 lb 11.2 oz (76.5 kg)     PHYSICAL EXAM: VS:  BP 118/68   Pulse 69   Ht 5\' 2"  (1.575 m)   Wt 184 lb 6.4 oz (83.6 kg)   BMI 33.73 kg/m  , BMI Body mass index is 33.73 kg/m. General:Pleasant affect, NAD Skin:Warm and dry, brisk capillary refill HEENT:normocephalic, sclera clear, mucus membranes moist Neck:supple, no JVD, no bruits  Heart:S1S2 RRR without murmur, gallup, rub or click Lungs:clear without rales, rhonchi, or wheezes [id number], non tender, + BS, do not palpate liver spleen or masses Ext:no lower ext edema, 2+ pedal pulses, 2+ radial pulses Neuro:alert and oriented, MAE, follows commands, + facial symmetry    EKG:  EKG is ordered today. The ekg ordered today demonstrates  SR with sinus arrhythmia no acute changes.     Recent Labs: 09/10/2016: ALT 17; BUN 18.9; Creatinine 0.8; HGB 12.8; Platelets 265; Potassium 4.6; Sodium 140    Lipid Panel No results found for: CHOL, TRIG, HDL, CHOLHDL, VLDL, LDLCALC, LDLDIRECT     Other studies Reviewed: Additional studies/ records that were reviewed today include: . Cardiac cath 10/2014  Procedural Findings: Hemodynamics: AO 136/61 mean 93 mm Hg LV 135/11 mm Hg  Coronary angiography: Coronary dominance: left  Left mainstem: Normal  Left anterior descending (LAD): Normal  Left circumflex (LCx): Very large, Normal.  Right coronary artery (RCA): Normal  Left ventriculography: Left ventricular  systolic function is normal, LVEF is estimated at 55-65%, there is no significant mitral regurgitation   Final Conclusions:   1. Normal coronary anatomy 2. Normal LV function.    ASSESSMENT AND PLAN:  1.  Chest pain with hx of Normal coronary arteries on cath in 2016. Pain could be GI - she is on zantac.  She is anxious it is her heart.  Will do lexiscan myoview - she has NTG take for pain, and I am changing zantac to protonix.  She will follow up with Dr. Martinique.   She will call if other problems.  2.  HLD on mevacor   3. Anxiety/stress concerning health of family   Current medicines are reviewed with the patient today.  The patient Has no concerns regarding medicines.  The following changes have been made:  See above Labs/ tests ordered today include:see above  Disposition:   FU:  see above  Signed, Cecilie Kicks, NP  11/10/2016 12:18 PM    [facility] [address], [place], [address] [place], [place] Phone: [phone]; Fax: [phone]

## 2016-11-10 ENCOUNTER — Ambulatory Visit (INDEPENDENT_AMBULATORY_CARE_PROVIDER_SITE_OTHER): Payer: Medicare Other | Admitting: Cardiology

## 2016-11-10 ENCOUNTER — Encounter: Payer: Self-pay | Admitting: Cardiology

## 2016-11-10 ENCOUNTER — Encounter (INDEPENDENT_AMBULATORY_CARE_PROVIDER_SITE_OTHER): Payer: Self-pay

## 2016-11-10 VITALS — BP 118/68 | HR 69 | Ht 62.0 in | Wt 184.4 lb

## 2016-11-10 DIAGNOSIS — E781 Pure hyperglyceridemia: Secondary | ICD-10-CM | POA: Diagnosis not present

## 2016-11-10 DIAGNOSIS — F419 Anxiety disorder, unspecified: Secondary | ICD-10-CM | POA: Diagnosis not present

## 2016-11-10 DIAGNOSIS — R079 Chest pain, unspecified: Secondary | ICD-10-CM

## 2016-11-10 MED ORDER — PANTOPRAZOLE SODIUM 40 MG PO TBEC: 40.0000 mg | DELAYED_RELEASE_TABLET | Freq: Every day | ORAL | 11 refills | Status: DC

## 2016-11-10 NOTE — Patient Instructions (Signed)
Medication Instructions:  1) STOP ZANTAC 2) START PROTONIX 40 mg daily  Labwork: None  Testing/Procedures: Your physician has requested that you have a lexiscan myoview. For further information please visit [email or web address]. Please follow instruction sheet, as given.  Follow-Up: Your physician recommends that you schedule a follow-up appointment with Dr. Martinique after your stress test.  Any Other Special Instructions Will Be Listed Below (If Applicable).     If you need a refill on your cardiac medications before your next appointment, please call your pharmacy.

## 2016-11-12 ENCOUNTER — Telehealth (HOSPITAL_COMMUNITY): Payer: Self-pay | Admitting: *Deleted

## 2016-11-12 ENCOUNTER — Encounter: Payer: Self-pay | Admitting: Cardiology

## 2016-11-12 NOTE — Telephone Encounter (Signed)
Patient given detailed instructions per Myocardial Perfusion Study Information Sheet for the test on 11/17/16. Patient notified to arrive 15 minutes early and that it is imperative to arrive on time for appointment to keep from having the test rescheduled.  If you need to cancel or reschedule your appointment, please call the office within 24 hours of your appointment. Failure to do so may result in a cancellation of your appointment, and a $50 no show fee. Patient verbalized understanding. Kerwin Augustus Jacqueline    

## 2016-11-17 ENCOUNTER — Ambulatory Visit (HOSPITAL_COMMUNITY): Payer: Medicare Other | Attending: Cardiology

## 2016-11-17 DIAGNOSIS — R110 Nausea: Secondary | ICD-10-CM

## 2016-11-17 DIAGNOSIS — R079 Chest pain, unspecified: Secondary | ICD-10-CM | POA: Insufficient documentation

## 2016-11-17 LAB — MYOCARDIAL PERFUSION IMAGING
CHL CUP NUCLEAR SRS: 0
CHL CUP RESTING HR STRESS: 79 {beats}/min
CSEPPHR: 98 {beats}/min
LVDIAVOL: 57 mL (ref 46–106)
LVSYSVOL: 16 mL
NUC STRESS TID: 1.04
RATE: 0.31
SDS: 2
SSS: 2

## 2016-11-17 MED ORDER — TECHNETIUM TC 99M TETROFOSMIN IV KIT
30.7000 | PACK | Freq: Once | INTRAVENOUS | Status: AC | PRN
  Administered 2016-11-17: 30.7 via INTRAVENOUS
  Filled 2016-11-17: qty 31

## 2016-11-17 MED ORDER — TECHNETIUM TC 99M TETROFOSMIN IV KIT
10.3000 | PACK | Freq: Once | INTRAVENOUS | Status: AC | PRN
  Administered 2016-11-17: 10.3 via INTRAVENOUS
  Filled 2016-11-17: qty 11

## 2016-11-17 MED ORDER — AMINOPHYLLINE 25 MG/ML IV SOLN
75.0000 mg | Freq: Once | INTRAVENOUS | Status: AC
  Administered 2016-11-17: 75 mg via INTRAVENOUS

## 2016-11-17 MED ORDER — REGADENOSON 0.4 MG/5ML IV SOLN
0.4000 mg | Freq: Once | INTRAVENOUS | Status: AC
  Administered 2016-11-17: 0.4 mg via INTRAVENOUS

## 2016-11-17 NOTE — Progress Notes (Signed)
Cardiology Office Note   Date:  11/19/2016   ID:  Ariel Henderson, Ariel Henderson [birth year], MRN [id number]  PCP:  Wenda Low, MD  Cardiologist:  Dr. Lindi Abram [place]    Chief Complaint  Patient presents with  . Follow-up    F/U stress  . Chest Pain      History of Present Illness: Ariel Henderson is a 71 y.o. female who is seen for follow up chest pain.  Pt originally seen for evaluation of chest pain. She has a history of HTN and hyperlipidemia. No prior history of heart disease. In 10/10/14 she had an episode of chest pain at rest. She described this as pain and squeezing across her anterior chest without radiation. No SOB. She felt hot. She took ASA and pain resolved after 45 minutes. She was seen by Dr. Lysle Rubens and EKG demonstrated anterior T wave inversion consistent with ischemia. Troponin level was normal.  She was seen and  Cardiac cath was recommended.  She underwent cardiac cath 10/10/14.  She was found to have normal coronary arteries and normal EF.  She was seen in follow up and reassured.Marland Kitchen She was to follow with her PCP and see [place] PRN.  In early March she presented with complaint of chest pain again. Seen by Cecilie Kicks NP. Myoview was scheduled  She reports to me that she has been having this pain off and on for one year. Pain is localized to lower sternum. Lasts 15 minutes or longer. Feels knife like and stabbing. Some associated nausea. Worse with deep breathing.   Past Medical History:  Diagnosis Date  . Breast cancer ([place]) 12/29/12 bx   left breast ductal,high grade ductal ca in situ w/asoc comedo necrosis  . Cancer ([place])    cervical per patient  . Chronic anxiety   . Complication of anesthesia    "slow to wake up"  . Dyslipidemia   . Fibrocystic breast disease   . GERD (gastroesophageal reflux disease)   . History of recurrent UTIs   . Hypertension   . Hypothyroidism   . Major depression   . Melanoma ([place])    per patient on 02/01/13; located on back; dx in 57s  .  Migraine headache   . Multinodular goiter   . Peripheral vascular disease ([facility])    varicose veins  . Pneumonia    hx  . Seasonal allergies   . Thyroid cancer ([place])    per patient on 02/01/2013; dx in 59s    Past Surgical History:  Procedure Laterality Date  . ABDOMINAL HYSTERECTOMY    . BREAST DUCTAL SYSTEM EXCISION Left 12/29/2012   Procedure: EXCISION DUCTAL SYSTEM BREAST;  Surgeon: Haywood Lasso, MD;  Location: [facility];  Service: General;  Laterality: Left;  . BREAST LUMPECTOMY     left   x 3, right x 2  . BREAST LUMPECTOMY Left 12/2009   lower inner=fibrocystic changes/sclerosing adenosis  . BREAST RECONSTRUCTION WITH PLACEMENT OF TISSUE EXPANDER AND FLEX HD (ACELLULAR HYDRATED DERMIS) Left 02/09/2013   Procedure: LEFT BREAST RECONSTRUCTION WITH PLACEMENT OF TISSUE EXPANDER ;  Surgeon: Crissie Reese, MD;  Location: [facility];  Service: Plastics;  Laterality: Left;  . EYE SURGERY Bilateral    cataract extraction with IOL  . LEFT HEART CATHETERIZATION WITH CORONARY ANGIOGRAM N/A 10/10/2014   Procedure: LEFT HEART CATHETERIZATION WITH CORONARY ANGIOGRAM;  Surgeon: Ronnika Collett M Martinique, MD;  Location: [facility] CATH [facility];  Service: Cardiovascular;  Laterality: N/A;  . MASTECTOMY W/ SENTINEL NODE BIOPSY Left  02/09/2013   Procedure: TOTAL MASTECTOMY WITH SENTINEL LYMPH NODE BIOPSY;  Surgeon: Haywood Lasso, MD;  Location: [facility];  Service: General;  Laterality: Left;  . THYROIDECTOMY  1991  . TOTAL MASTECTOMY Left 02/09/2013   WITH TISSUE EXPANDER & BIOPSY  . WRIST FRACTURE SURGERY       Current Outpatient Prescriptions  Medication Sig Dispense Refill  . almotriptan (AXERT) 12.5 MG tablet Take 12.5 mg by mouth daily as needed for migraine. may repeat in 2 hours if needed    . Calcium Carbonate-Vit D-Min (CALCIUM 1200 PO) Take 1,000 mg by mouth daily.     . diazepam (VALIUM) 10 MG tablet Take 10 mg by mouth 2 (two) times daily as needed for anxiety.     Marland Kitchen levothyroxine (SYNTHROID, LEVOTHROID) 137 MCG  tablet Take 137 mcg by mouth daily before breakfast.     . lovastatin (MEVACOR) 20 MG tablet Take 20 mg by mouth at bedtime.    . nitroGLYCERIN (NITROSTAT) 0.4 MG SL tablet Place 1 tablet (0.4 mg total) under the tongue every 5 (five) minutes as needed for chest pain. 25 tablet 6  . omeprazole (PRILOSEC) 40 MG capsule Take 40 mg by mouth daily.     . pantoprazole (PROTONIX) 40 MG tablet Take 1 tablet (40 mg total) by mouth daily. 30 tablet 11  . PREVNAR 13 SUSP injection     . propranolol (INDERAL) 40 MG tablet Take 40 mg by mouth daily.    . tamoxifen (NOLVADEX) 20 MG tablet TAKE 1 TABLET BY MOUTH  DAILY 90 tablet 1  . venlafaxine (EFFEXOR) 75 MG tablet Take 75 mg by mouth 2 (two) times daily.    Marland Kitchen ibuprofen (ADVIL,MOTRIN) 400 MG tablet Take 1 tablet (400 mg total) by mouth every 8 (eight) hours. Take with food. 90 tablet 0   No current facility-administered medications for this visit.     Allergies:   Aspirin GI distress at higher doses   Social History:  The patient  reports that she has never smoked. She has never used smokeless tobacco. She reports that she does not drink alcohol or use drugs.   Family History:  The patient's family history includes Brain cancer (age of onset: 10) in her sister; Cancer (age of onset: 34) in her brother; Colon cancer (age of onset: 2) in her brother; Lung cancer (age of onset: 46) in her father; Ovarian cancer (age of onset: 78) in her sister; Prostate cancer (age of onset: 61) in her brother; Prostate cancer (age of onset: 4) in her brother; Prostate cancer (age of onset: 55) in her brother; Prostate cancer (age of onset: 22) in her brother and brother.    ROS:  As noted in HPI. All other systems are reviewed and are normal.   Wt Readings from Last 3 Encounters:  11/19/16 181 lb (82.1 kg)  11/10/16 184 lb 6.4 oz (83.6 kg)  09/10/16 181 lb (82.1 kg)     PHYSICAL EXAM: VS:  BP 126/74   Pulse 72   Ht 5\' 2"  (1.575 m)   Wt 181 lb (82.1 kg)   BMI  33.11 kg/m  , BMI Body mass index is 33.11 kg/m. General: NAD Skin:Warm and dry, brisk capillary refill HEENT:normocephalic, sclera clear, mucus membranes moist Neck:supple, no JVD, no bruits  Heart:S1S2 RRR without murmur, gallup, rub or click. Pain on palpation in lower sternum and costochondral junction.  Lungs:clear without rales, rhonchi, or wheezes HCW:CBJS, non tender, + BS, do not palpate liver  spleen or masses Ext:no lower ext edema, 2+ pedal pulses, 2+ radial pulses Neuro:alert and oriented, MAE, follows commands, + facial symmetry    EKG:  EKG is not ordered today.   Recent Labs: 09/10/2016: ALT 17; BUN 18.9; Creatinine 0.8; HGB 12.8; Platelets 265; Potassium 4.6; Sodium 140    Lipid Panel No results found for: CHOL, TRIG, HDL, CHOLHDL, VLDL, LDLCALC, LDLDIRECT   Labs dated 12/03/15: cholesterol 164, triglycerides 115, HDL 50, LDL 92  Other studies Reviewed: Additional studies/ records that were reviewed today include: . Cardiac cath 10/2014  Procedural Findings: Hemodynamics: AO 136/61 mean 93 mm Hg LV 135/11 mm Hg  Coronary angiography: Coronary dominance: left  Left mainstem: Normal  Left anterior descending (LAD): Normal  Left circumflex (LCx): Very large, Normal.  Right coronary artery (RCA): Normal  Left ventriculography: Left ventricular systolic function is normal, LVEF is estimated at 55-65%, there is no significant mitral regurgitation   Final Conclusions:   1. Normal coronary anatomy 2. Normal LV function.    Myoview 11/17/16: Study Highlights     Nuclear stress EF: 72%.  There was no ST segment deviation noted during stress.  The study is normal.  This is a low risk study.  The left ventricular ejection fraction is hyperdynamic (>65%).   Normal stress nuclear study with no ischemia or infarction; EF 72 with normal wall motion.      ASSESSMENT AND PLAN:  1.  Chest pain most consistent with costochondritis. Reproduced  on exam. She has a history of Normal coronary arteries on cath in 2016. Myoview now normal. Recommend ibuprofen 400 mg tid with food for 2 weeks then prn. Recommend she stop ASA (not indication to take). Will follow up with primary care.   2.  HLD on mevacor   3. Anxiety/stress concerning health of family   Current medicines are reviewed with the patient today.  The patient Has no concerns regarding medicines.  The following changes have been made:  See above Labs/ tests ordered today include:see above  Disposition:   FU:  see above  Signed, Joshalyn Ancheta Martinique, MD  11/19/2016 11:56 AM    [facility] [place], [place], [place], [place] Phone: [phone]; Fax: [phone]

## 2016-11-19 ENCOUNTER — Encounter: Payer: Self-pay | Admitting: Cardiology

## 2016-11-19 ENCOUNTER — Ambulatory Visit (INDEPENDENT_AMBULATORY_CARE_PROVIDER_SITE_OTHER): Payer: Medicare Other | Admitting: Cardiology

## 2016-11-19 VITALS — BP 126/74 | HR 72 | Ht 62.0 in | Wt 181.0 lb

## 2016-11-19 DIAGNOSIS — M940 Chondrocostal junction syndrome [Tietze]: Secondary | ICD-10-CM | POA: Diagnosis not present

## 2016-11-19 MED ORDER — IBUPROFEN 400 MG PO TABS: 400.0000 mg | ORAL_TABLET | Freq: Three times a day (TID) | ORAL | 0 refills | Status: DC

## 2016-11-19 NOTE — Patient Instructions (Signed)
Stop taking ASA  Start Ibuprofen 400 mg three times a day with food for 2 weeks. After that you can take prn.  Follow up with Dr. Lysle Rubens.  I will see you as needed. Costochondritis Costochondritis is swelling and irritation (inflammation) of the tissue (cartilage) that connects your ribs to your breastbone (sternum). This causes pain in the front of your chest. Usually, the pain:  Starts gradually.  Is in more than one rib. This condition usually goes away on its own over time. Follow these instructions at home:  Do not do anything that makes your pain worse.  If directed, put ice on the painful area:  Put ice in a plastic bag.  Place a towel between your skin and the bag.  Leave the ice on for 20 minutes, 2-3 times a day.  If directed, put heat on the affected area as often as told by your doctor. Use the heat source that your doctor tells you to use, such as a moist heat pack or a heating pad.  Place a towel between your skin and the heat source.  Leave the heat on for 20-30 minutes.  Take off the heat if your skin turns bright red. This is very important if you cannot feel pain, heat, or cold. You may have a greater risk of getting burned.  Take over-the-counter and prescription medicines only as told by your doctor.  Return to your normal activities as told by your doctor. Ask your doctor what activities are safe for you.  Keep all follow-up visits as told by your doctor. This is important. Contact a doctor if:  You have chills or a fever.  Your pain does not go away or it gets worse.  You have a cough that does not go away. Get help right away if:  You are short of breath. This information is not intended to replace advice given to you by your health care provider. Make sure you discuss any questions you have with your health care provider. Document Released: 02/10/2008 Document Revised: 03/13/2016 Document Reviewed: 12/18/2015 Elsevier Interactive Patient  Education  2017 [employer].

## 2017-01-19 ENCOUNTER — Telehealth: Payer: Self-pay

## 2017-01-19 NOTE — Telephone Encounter (Signed)
Records rcvd from Dr. Jordan Likes- placed in your folder for review. Ariel Henderson

## 2017-01-20 ENCOUNTER — Other Ambulatory Visit: Payer: Self-pay | Admitting: Hematology and Oncology

## 2017-01-20 DIAGNOSIS — C50112 Malignant neoplasm of central portion of left female breast: Secondary | ICD-10-CM

## 2017-02-10 ENCOUNTER — Encounter: Payer: Self-pay | Admitting: Internal Medicine

## 2017-02-10 NOTE — Telephone Encounter (Signed)
This encounter was created in error - please disregard.

## 2017-03-16 ENCOUNTER — Ambulatory Visit (INDEPENDENT_AMBULATORY_CARE_PROVIDER_SITE_OTHER): Payer: Medicare Other | Admitting: Family Medicine

## 2017-03-16 ENCOUNTER — Encounter: Payer: Self-pay | Admitting: Family Medicine

## 2017-03-16 VITALS — BP 124/80 | HR 67 | Ht 63.0 in | Wt 181.0 lb

## 2017-03-16 DIAGNOSIS — Z170 Estrogen receptor positive status [ER+]: Secondary | ICD-10-CM | POA: Diagnosis not present

## 2017-03-16 DIAGNOSIS — C73 Malignant neoplasm of thyroid gland: Secondary | ICD-10-CM

## 2017-03-16 DIAGNOSIS — C50112 Malignant neoplasm of central portion of left female breast: Secondary | ICD-10-CM

## 2017-03-16 DIAGNOSIS — I10 Essential (primary) hypertension: Secondary | ICD-10-CM

## 2017-03-16 DIAGNOSIS — E785 Hyperlipidemia, unspecified: Secondary | ICD-10-CM

## 2017-03-16 DIAGNOSIS — G43809 Other migraine, not intractable, without status migrainosus: Secondary | ICD-10-CM

## 2017-03-16 DIAGNOSIS — C439 Malignant melanoma of skin, unspecified: Secondary | ICD-10-CM | POA: Diagnosis not present

## 2017-03-16 DIAGNOSIS — F419 Anxiety disorder, unspecified: Secondary | ICD-10-CM | POA: Diagnosis not present

## 2017-03-16 DIAGNOSIS — E890 Postprocedural hypothyroidism: Secondary | ICD-10-CM | POA: Diagnosis not present

## 2017-03-16 DIAGNOSIS — K219 Gastro-esophageal reflux disease without esophagitis: Secondary | ICD-10-CM

## 2017-03-16 NOTE — Progress Notes (Signed)
   Subjective:    Patient ID: Ariel Henderson, female    DOB: December 13, 1945, 71 y.o.   MRN: [id number]  HPI she is here for a get acquainted visit and switching her care to me. She does have a history of breast cancer and presently is on tamoxifen. She is being followed by oncology for this. She has history of migraine headaches and usually uses Imitrex once or twice per month. She has a history of reflux but has not had any difficulty recently and record indicates she is taking an H2 blocker and to PPIs. Presently she is taking lovastatin for her hyperlipidemia. She has a previous history of chest pain and was evaluated including doing a cardiac cath which was totally negative. She does have history of anxiety and depression however stopped taking her Effexor several months ago and is having no difficulty with this. She is taking Valium on a daily basis but gives no good reason as to why she is taking it. She does occasionally have difficulty with anxiety. She has a previous history of thyroidectomy with one note saying a benign nodule and another one saying thyroid cancer. Presently she is taking Synthroid. Recent TSH was quite low. She also has a history of melanoma but has not been evaluated in several years for this.  Review of Systems     Objective:   Physical Exam Alert and in no distress otherwise not examined       Assessment & Plan:  Hyperlipidemia, unspecified hyperlipidemia type  Thyroid cancer ([place]) - Plan: TSH  Anxiety  Malignant neoplasm of central portion of left breast in female, estrogen receptor positive ([place])  Essential hypertension  Melanoma of skin (H[facility])  Gastroesophageal reflux disease without esophagitis  Other migraine without status migrainosus, not intractable  Postoperative hypothyroidism Since she has stopped taking the Effexor, I will also have her stop the Valium that she is not taking it for any good reason. I will do another TSH as her previous TSH was  quite low. She will keep me informed as to how she's doing on her anxiety. She will continue be followed by oncology. Continue on her blood pressure as well as cholesterol medications. She would like to see the dermatologist that her husband is seeing. Ask her to get the name and I will gladly refer. She is to stop the ranitidine and pantoprazole and only use the Prilosec on an as-needed basis. She will keep me informed as to how much trouble she is having with her reflux. Continue on her migraine medication. At the end of the encounter she mentioned difficulty with left knee pain and have asked her to return for further evaluation of this. Over 30 minutes, the entire time spent in counseling and coordination of care.

## 2017-03-16 NOTE — Patient Instructions (Signed)
Get rid of the diazepam don't take the ranitidine use the omeprazole as needed for acid indigestion

## 2017-03-17 ENCOUNTER — Other Ambulatory Visit: Payer: Self-pay | Admitting: Family Medicine

## 2017-03-17 LAB — TSH: TSH: 0.1 mIU/L — ABNORMAL LOW

## 2017-03-17 MED ORDER — LEVOTHYROXINE SODIUM 112 MCG PO TABS: 112.0000 ug | ORAL_TABLET | Freq: Every day | ORAL | 0 refills | Status: DC

## 2017-03-17 NOTE — Addendum Note (Signed)
Addended by: Denita Lung on: 03/17/2017 12:56 PM   Modules accepted: Orders

## 2017-03-30 ENCOUNTER — Ambulatory Visit
Admission: RE | Admit: 2017-03-30 | Discharge: 2017-03-30 | Disposition: A | Discharge status: Home / Self Care | Payer: Medicare Other | Source: Ambulatory Visit | Attending: Family Medicine | Admitting: Family Medicine

## 2017-03-30 ENCOUNTER — Ambulatory Visit (INDEPENDENT_AMBULATORY_CARE_PROVIDER_SITE_OTHER): Payer: Medicare Other | Admitting: Family Medicine

## 2017-03-30 VITALS — BP 120/70 | HR 67 | Wt 178.2 lb

## 2017-03-30 DIAGNOSIS — M25462 Effusion, left knee: Secondary | ICD-10-CM | POA: Diagnosis not present

## 2017-03-30 DIAGNOSIS — G8929 Other chronic pain: Secondary | ICD-10-CM

## 2017-03-30 DIAGNOSIS — M25562 Pain in left knee: Secondary | ICD-10-CM

## 2017-03-30 NOTE — Progress Notes (Signed)
   Subjective:    Patient ID: Ariel Henderson, female    DOB: 08-May-1946, 71 y.o.   MRN: [id number]  HPI She is here for evaluation of left knee pain. She states that she has had trouble with this for 10 years however in the last several months the pain has increased. She describes a feeling as if the patella is subluxed but states that this is been going on for several years. She is mainly complaining of knee pain interfering with her ability to function but no locking or grinding.   Review of Systems     Objective:   Physical Exam Full motion of the knee. Slight tenderness palpation to the lateral joint line. Apprehension test was questionably positive. Medial and lateral collateral ligaments as well as anterior drawer is negative. McMurray's testing causing slight discomfort.       Assessment & Plan:  Chronic pain of left knee - Plan: DG Knee Complete 4 Views Left Some of her symptoms sound like she might be chronically subluxing her patella. Also will look at the joint for evidence of arthritis. Suspect mainly arthritic changes and will probably then given a steroid injection see if this will help.

## 2017-03-31 ENCOUNTER — Ambulatory Visit (INDEPENDENT_AMBULATORY_CARE_PROVIDER_SITE_OTHER): Payer: Medicare Other | Admitting: Family Medicine

## 2017-03-31 DIAGNOSIS — M25562 Pain in left knee: Secondary | ICD-10-CM | POA: Diagnosis not present

## 2017-03-31 DIAGNOSIS — M1990 Unspecified osteoarthritis, unspecified site: Secondary | ICD-10-CM | POA: Insufficient documentation

## 2017-03-31 MED ORDER — TRIAMCINOLONE ACETONIDE 40 MG/ML IJ SUSP
40.0000 mg | Freq: Once | INTRAMUSCULAR | Status: AC
  Administered 2017-03-31: 40 mg via INTRAMUSCULAR

## 2017-03-31 MED ORDER — LIDOCAINE HCL 2 % IJ SOLN
3.0000 mL | Freq: Once | INTRAMUSCULAR | Status: AC
  Administered 2017-03-31: 60 mg via INTRADERMAL

## 2017-03-31 NOTE — Progress Notes (Signed)
   Subjective:    Patient ID: Ariel Henderson, female    DOB: 03-May-1946, 71 y.o.   MRN: [id number]  HPI she is here for a knee injection. She had a recent x-ray and evaluation and was found to have arthritic changes but very minimal. She would like an injection.  Review of Systems     Objective:   Physical Exam Alert and in no distress otherwise not examined       Assessment & Plan:  Left knee pain, unspecified chronicity - Plan: lidocaine (XYLOCAINE) 2 % (with pres) injection 60 mg, triamcinolone acetonide (KENALOG-40) injection 40 mg  Arthritis The lateral joint line was identified. 40 mg of Kenalog and 3 mL of Xylocaine was injected into the joint space without difficulty. Within several minutes she did obtain relief of her symptoms. I explained the possibility of steroid flare to her. She will return here as needed.

## 2017-05-05 ENCOUNTER — Other Ambulatory Visit: Payer: Self-pay | Admitting: Family Medicine

## 2017-05-25 ENCOUNTER — Encounter: Payer: Self-pay | Admitting: Genetic Counselor

## 2017-05-25 DIAGNOSIS — Z1379 Encounter for other screening for genetic and chromosomal anomalies: Secondary | ICD-10-CM | POA: Insufficient documentation

## 2017-06-23 ENCOUNTER — Ambulatory Visit (INDEPENDENT_AMBULATORY_CARE_PROVIDER_SITE_OTHER): Payer: Medicare Other | Admitting: Family Medicine

## 2017-06-23 VITALS — BP 130/80 | HR 75 | Wt 170.8 lb

## 2017-06-23 DIAGNOSIS — E890 Postprocedural hypothyroidism: Secondary | ICD-10-CM | POA: Diagnosis not present

## 2017-06-23 LAB — TSH: TSH: 0.05 m[IU]/L — ABNORMAL LOW (ref 0.40–4.50)

## 2017-06-23 NOTE — Progress Notes (Addendum)
   Subjective:    Patient ID: Ariel Henderson, female    DOB: 06/01/1946, 71 y.o.   MRN: [id number]  HPI He is here for recheck. Recent blood work did show a low TSH and her thyroid was readjusted 2.112. She is here for follow-up blood work. He has a previous history of thyroid cancer and thyroidectomy with follow-up hypothyroidism.   Review of Systems     Objective:   Physical Exam Alert and in no distress otherwise not examined       Assessment & Plan:  Postoperative hypothyroidism - Plan: TSH  Her TSH is still quite low. I will drop her down to increments two levels to 87mcg and recheck in 2 months

## 2017-06-23 NOTE — Patient Instructions (Signed)
Try black cohosh for the hot flashes

## 2017-06-24 MED ORDER — LEVOTHYROXINE SODIUM 88 MCG PO TABS: 88.0000 ug | ORAL_TABLET | Freq: Every day | ORAL | 3 refills | Status: DC

## 2017-06-24 NOTE — Addendum Note (Signed)
Addended by: Denita Lung on: 06/24/2017 08:22 AM   Modules accepted: Orders

## 2017-08-24 ENCOUNTER — Other Ambulatory Visit: Payer: Medicare Other

## 2017-08-24 DIAGNOSIS — Z85850 Personal history of malignant neoplasm of thyroid: Secondary | ICD-10-CM | POA: Diagnosis not present

## 2017-08-24 LAB — TSH: TSH: 0.78 mIU/L (ref 0.40–4.50)

## 2017-08-24 NOTE — Addendum Note (Signed)
Addended by: Caprice Beaver T on: 08/24/2017 02:51 PM   Modules accepted: Orders

## 2017-08-25 ENCOUNTER — Other Ambulatory Visit: Payer: Self-pay

## 2017-09-09 ENCOUNTER — Other Ambulatory Visit: Payer: Self-pay

## 2017-09-09 DIAGNOSIS — Z170 Estrogen receptor positive status [ER+]: Principal | ICD-10-CM

## 2017-09-09 DIAGNOSIS — C50112 Malignant neoplasm of central portion of left female breast: Secondary | ICD-10-CM

## 2017-09-09 NOTE — Assessment & Plan Note (Signed)
DCIS left breast diagnosed in 2014 status post mastectomy after the initial lumpectomy had positive margins. She underwent breast reconstruction. She has been on oral tamoxifen since July 2014  Tamoxifen Toxicities: 1. Intermittent hot flashes 2. intermittent muscle cramps  Breast Cancer Surveillance: 1. Breast exam 09/10/16: Normal , tenderness in the left axilla in the Muscles. No palpable nodules or lumps. 2. Mammogram 06/26/16: No abnormalities. Postsurgical changes. Breast Density Category B.  RTC in 1 year with labs

## 2017-09-10 ENCOUNTER — Other Ambulatory Visit (HOSPITAL_BASED_OUTPATIENT_CLINIC_OR_DEPARTMENT_OTHER): Payer: Medicare Other

## 2017-09-10 ENCOUNTER — Telehealth: Payer: Self-pay | Admitting: Hematology and Oncology

## 2017-09-10 ENCOUNTER — Ambulatory Visit (HOSPITAL_BASED_OUTPATIENT_CLINIC_OR_DEPARTMENT_OTHER): Payer: Medicare Other | Admitting: Hematology and Oncology

## 2017-09-10 DIAGNOSIS — Z170 Estrogen receptor positive status [ER+]: Secondary | ICD-10-CM

## 2017-09-10 DIAGNOSIS — D0512 Intraductal carcinoma in situ of left breast: Secondary | ICD-10-CM

## 2017-09-10 DIAGNOSIS — C50112 Malignant neoplasm of central portion of left female breast: Secondary | ICD-10-CM

## 2017-09-10 LAB — CBC WITH DIFFERENTIAL/PLATELET
BASO%: 0.7 % (ref 0.0–2.0)
BASOS ABS: 0.1 10*3/uL (ref 0.0–0.1)
EOS%: 2.1 % (ref 0.0–7.0)
Eosinophils Absolute: 0.2 10*3/uL (ref 0.0–0.5)
HEMATOCRIT: 37.9 % (ref 34.8–46.6)
HEMOGLOBIN: 12.3 g/dL (ref 11.6–15.9)
LYMPH#: 3.6 10*3/uL — AB (ref 0.9–3.3)
LYMPH%: 32.4 % (ref 14.0–49.7)
MCH: 28.7 pg (ref 25.1–34.0)
MCHC: 32.5 g/dL (ref 31.5–36.0)
MCV: 88.6 fL (ref 79.5–101.0)
MONO#: 0.8 10*3/uL (ref 0.1–0.9)
MONO%: 7.5 % (ref 0.0–14.0)
NEUT#: 6.4 10*3/uL (ref 1.5–6.5)
NEUT%: 57.3 % (ref 38.4–76.8)
PLATELETS: 303 10*3/uL (ref 145–400)
RBC: 4.28 10*6/uL (ref 3.70–5.45)
RDW: 13.4 % (ref 11.2–14.5)
WBC: 11.1 10*3/uL — ABNORMAL HIGH (ref 3.9–10.3)

## 2017-09-10 LAB — COMPREHENSIVE METABOLIC PANEL
ALT: 22 U/L (ref 0–55)
ANION GAP: 7 meq/L (ref 3–11)
AST: 20 U/L (ref 5–34)
Albumin: 3.3 g/dL — ABNORMAL LOW (ref 3.5–5.0)
Alkaline Phosphatase: 51 U/L (ref 40–150)
BUN: 17.1 mg/dL (ref 7.0–26.0)
CHLORIDE: 107 meq/L (ref 98–109)
CO2: 23 meq/L (ref 22–29)
Calcium: 9.2 mg/dL (ref 8.4–10.4)
Creatinine: 0.7 mg/dL (ref 0.6–1.1)
Glucose: 106 mg/dl (ref 70–140)
Potassium: 4.4 mEq/L (ref 3.5–5.1)
SODIUM: 138 meq/L (ref 136–145)
Total Bilirubin: <0.22 mg/dL (ref 0.20–1.20)
Total Protein: 6.6 g/dL (ref 6.4–8.3)

## 2017-09-10 LAB — DRAW EXTRA CLOT TUBE

## 2017-09-10 MED ORDER — TAMOXIFEN CITRATE 20 MG PO TABS: 20.0000 mg | ORAL_TABLET | Freq: Every day | ORAL | 1 refills | Status: DC

## 2017-09-10 NOTE — Telephone Encounter (Signed)
Gave patient AVS and calendar of upcoming January 2020 appointments.

## 2017-09-10 NOTE — Progress Notes (Signed)
Patient Care Team: Denita Lung, MD as PCP - General (Family Medicine)  DIAGNOSIS:  Encounter Diagnoses  Name Primary?  . Malignant neoplasm of central portion of left breast in female, estrogen receptor positive ([place])   . Malignant neoplasm of central portion of left female breast ([place])     SUMMARY OF ONCOLOGIC HISTORY:   Cancer of central portion of left female breast ([place])   12/29/2012 Surgery    Left breast lumpectomy: High-grade DCIS with comedo necrosis extending to the margins ER 0% PR 0%      02/09/2013 Surgery    Left breast mastectomy with reconstruction: DCIS high-grade with comedo necrosis 4 cm 2 SLN negative +1 additional lymph nodes negative, ER 0%, PR 0%      02/22/2013 Genetic Testing    VHL c.5C>G (p.Pro2Leu) VUS identified on the Comprehensive Cancer Panel.  The Comprehensive Common Cancer Panel offered by [facility] includes sequencing and/or deletion duplication testing of the following 29 genes: APC, ATM, AXIN2, BARD1, BMPR1A, BRCA1, BRCA2, BRIP1, CDH1, CDK4, CDKN2A, CHEK2, EPCAM, FANCC, MLH1, MSH2, MSH6, MUTYH, NBN, PALB2, PMS2, PTEN, RAD51C, RAD51D, SMAD4, STK11, TP53, and VHL.   The report date is February 22, 2013.  UPDATE: VHL c.5C>G (p.Pro2Leu) has been reclassified from a variant of uncertain significance to a likely benign variant based on a combination of sources, e.g., internal data, published literature, population databases and in silico models.  The updated report date is March 30, 2017.       03/27/2013 -  Anti-estrogen oral therapy    Tamoxifen 20 mg daily       CHIEF COMPLIANT: Follow-up on tamoxifen therapy  INTERVAL HISTORY: Ariel Henderson is a 72 year old with above-mentioned history of left breast DCIS treated with mastectomy and is here currently receiving tamoxifen since July 2014.  She has been on it for 4 and half years.  It is interesting to note that the original pathology for DCIS was ER PR negative.  She will finish 5 years of therapy in  6 months and then will discontinue the tamoxifen.  Overall she is tolerating tamoxifen extremely well without any major side effects.  REVIEW OF SYSTEMS:   Constitutional: Denies fevers, chills or abnormal weight loss Eyes: Denies blurriness of vision Ears, nose, mouth, throat, and face: Denies mucositis or sore throat Respiratory: Denies cough, dyspnea or wheezes Cardiovascular: Denies palpitation, chest discomfort Gastrointestinal:  Denies nausea, heartburn or change in bowel habits Skin: Denies abnormal skin rashes Lymphatics: Denies new lymphadenopathy or easy bruising Neurological:Denies numbness, tingling or new weaknesses Behavioral/Psych: Mood is stable, no new changes  Extremities: No lower extremity edema Breast:  denies any pain or lumps or nodules in either breasts All other systems were reviewed with the patient and are negative.  I have reviewed the past medical history, past surgical history, social history and family history with the patient and they are unchanged from previous note.  ALLERGIES:  is allergic to aspirin.  MEDICATIONS:  Current Outpatient Medications  Medication Sig Dispense Refill  . Calcium Carbonate-Vit D-Min (CALCIUM 1200 PO) Take 1,000 mg by mouth daily.     Marland Kitchen levothyroxine (SYNTHROID, LEVOTHROID) 88 MCG tablet Take 1 tablet (88 mcg total) by mouth daily. 90 tablet 3  . lovastatin (MEVACOR) 40 MG tablet Take 40 mg by mouth at bedtime.    . nitroGLYCERIN (NITROSTAT) 0.4 MG SL tablet Place 1 tablet (0.4 mg total) under the tongue every 5 (five) minutes as needed for chest pain. 25 tablet 6  .  omeprazole (PRILOSEC) 40 MG capsule Take 40 mg by mouth daily.     . propranolol (INDERAL) 40 MG tablet Take 40 mg by mouth daily.    . ranitidine (ZANTAC) 150 MG tablet Take 150 mg by mouth 2 (two) times daily.    . SUMAtriptan (IMITREX) 100 MG tablet Take 100 mg by mouth every 2 (two) hours as needed for migraine. May repeat in 2 hours if headache persists or  recurs.    . tamoxifen (NOLVADEX) 20 MG tablet Take 1 tablet (20 mg total) by mouth daily. 90 tablet 1   No current facility-administered medications for this visit.     PHYSICAL EXAMINATION: ECOG PERFORMANCE STATUS: 0 - Asymptomatic  Vitals:   09/10/17 1115  BP: 119/66  Pulse: 63  Resp: 14  Temp: 97.7 F (36.5 C)  SpO2: 99%   Filed Weights   09/10/17 1115  Weight: 170 lb (77.1 kg)    GENERAL:alert, no distress and comfortable SKIN: skin color, texture, turgor are normal, no rashes or significant lesions EYES: normal, Conjunctiva are pink and non-injected, sclera clear OROPHARYNX:no exudate, no erythema and lips, buccal mucosa, and tongue normal  NECK: supple, thyroid normal size, non-tender, without nodularity LYMPH:  no palpable lymphadenopathy in the cervical, axillary or inguinal LUNGS: clear to auscultation and percussion with normal breathing effort HEART: regular rate & rhythm and no murmurs and no lower extremity edema ABDOMEN:abdomen soft, non-tender and normal bowel sounds MUSCULOSKELETAL:no cyanosis of digits and no clubbing  NEURO: alert & oriented x 3 with fluent speech, no focal motor/sensory deficits EXTREMITIES: No lower extremity edema BREAST: No palpable masses or nodules in either right or left breasts. No palpable axillary supraclavicular or infraclavicular adenopathy no breast tenderness or nipple discharge. (exam performed in the presence of a chaperone)  LABORATORY DATA:  I have reviewed the data as listed   Chemistry      Component Value Date/Time   NA 140 09/10/2016 1150   K 4.6 09/10/2016 1150   CL 103 10/03/2014 1034   CO2 24 09/10/2016 1150   BUN 18.9 09/10/2016 1150   CREATININE 0.8 09/10/2016 1150      Component Value Date/Time   CALCIUM 9.2 09/10/2016 1150   ALKPHOS 68 09/10/2016 1150   AST 20 09/10/2016 1150   ALT 17 09/10/2016 1150   BILITOT 0.25 09/10/2016 1150       Lab Results  Component Value Date   WBC 11.1 (H)  09/10/2017   HGB 12.3 09/10/2017   HCT 37.9 09/10/2017   MCV 88.6 09/10/2017   PLT 303 09/10/2017   NEUTROABS 6.4 09/10/2017    ASSESSMENT & PLAN:  Cancer of central portion of left female breast DCIS left breast diagnosed in 2014 status post mastectomy after the initial lumpectomy had positive margins. She underwent breast reconstruction. She has been on oral tamoxifen since July 2014  Tamoxifen Toxicities: 1. Intermittent hot flashes 2. intermittent muscle cramps Patient will finish on tamoxifen by July 2019.  I instructed her to stop taking it at that time. I sent a new prescription to her pharmacy.  Breast Cancer Surveillance: 1. Breast exam 09/10/16: Normal , tenderness in the left axilla. No palpable nodules or lumps. 2. Mammogram 06/26/16: No abnormalities. Postsurgical changes. Breast Density Category B.  RTC in 1 year with labs.  In spite of my discussions, patient prefers to have blood work done every year.  I spent 25 minutes talking to the patient of which more than half was spent in counseling  and coordination of care.  Orders Placed This Encounter  Procedures  . CBC with Differential    Standing Status:   Future    Standing Expiration Date:   09/10/2018  . Comprehensive metabolic panel    Standing Status:   Future    Standing Expiration Date:   09/10/2018   The patient has a good understanding of the overall plan. she agrees with it. she will call with any problems that may develop before the next visit here.   Harriette Ohara, MD 09/10/17

## 2017-09-20 ENCOUNTER — Other Ambulatory Visit: Payer: Self-pay

## 2017-09-20 ENCOUNTER — Ambulatory Visit (HOSPITAL_COMMUNITY)
Admission: EM | Admit: 2017-09-20 | Discharge: 2017-09-20 | Disposition: A | Discharge status: Home / Self Care | Payer: Medicare Other | Attending: Internal Medicine | Admitting: Internal Medicine

## 2017-09-20 ENCOUNTER — Telehealth: Payer: Self-pay

## 2017-09-20 ENCOUNTER — Encounter (HOSPITAL_COMMUNITY): Payer: Self-pay | Admitting: Emergency Medicine

## 2017-09-20 DIAGNOSIS — J069 Acute upper respiratory infection, unspecified: Secondary | ICD-10-CM

## 2017-09-20 DIAGNOSIS — B9789 Other viral agents as the cause of diseases classified elsewhere: Secondary | ICD-10-CM

## 2017-09-20 MED ORDER — FLUTICASONE PROPIONATE 50 MCG/ACT NA SUSP: 2.0000 | Freq: Every day | NASAL | 0 refills | Status: AC

## 2017-09-20 MED ORDER — BENZONATATE 100 MG PO CAPS: 100.0000 mg | ORAL_CAPSULE | Freq: Three times a day (TID) | ORAL | 0 refills | Status: DC

## 2017-09-20 NOTE — ED Provider Notes (Signed)
[place] [facility]    CSN: [id number] Arrival date & time: 09/20/17  1321     History   Chief Complaint Chief Complaint  Patient presents with  . Cough    HPI Ariel Henderson is a 72 y.o. female.   71 year old female with history of breast cancer, HLD, hypothyroid, PVD, comes in for 4-day history of URI symptoms.  States she has had nonproductive cough.  No obvious rhinorrhea, nasal congestion.  She has had mild sore throat from coughing.  Denies fever, chills, night sweats.  Denies chest pain, shortness of breath, wheezing.  She has tried OTC cold medication with little relief.  Positive sick contact.  Never smoker.        Past Medical History:  Diagnosis Date  . Breast cancer ([place]) 12/29/12 bx   left breast ductal,high grade ductal ca in situ w/asoc comedo necrosis  . Cancer ([place])    cervical per patient  . Chronic anxiety   . Complication of anesthesia    "slow to wake up"  . Dyslipidemia   . Fibrocystic breast disease   . GERD (gastroesophageal reflux disease)   . History of recurrent UTIs   . Hypertension   . Hypothyroidism   . Major depression   . Melanoma ([place])    per patient on 02/01/13; located on back; dx in 46s  . Migraine headache   . Multinodular goiter   . Peripheral vascular disease ([facility])    varicose veins  . Pneumonia    hx  . Seasonal allergies   . Thyroid cancer ([place])    per patient on 02/01/2013; dx in 41s    Patient Active Problem List   Diagnosis Date Noted  . Genetic testing 05/25/2017  . Arthritis 03/31/2017  . Hyperlipidemia 10/03/2014  . HTN (hypertension) 10/03/2014  . Anxiety 02/12/2014  . Major depression in remission ([place]) 02/12/2014  . History of thyroid cancer 02/01/2013  . Melanoma of skin ([place]) 02/01/2013  . Family history of malignant neoplasm of gastrointestinal tract 02/01/2013  . Family history of ovarian cancer 02/01/2013  . Family history of prostate cancer 02/01/2013  . Cancer of central portion of left  female breast ([place]) 01/10/2013    Past Surgical History:  Procedure Laterality Date  . ABDOMINAL HYSTERECTOMY    . BREAST DUCTAL SYSTEM EXCISION Left 12/29/2012   Procedure: EXCISION DUCTAL SYSTEM BREAST;  Surgeon: Haywood Lasso, MD;  Location: [facility];  Service: General;  Laterality: Left;  . BREAST LUMPECTOMY     left   x 3, right x 2  . BREAST LUMPECTOMY Left 12/2009   lower inner=fibrocystic changes/sclerosing adenosis  . BREAST RECONSTRUCTION WITH PLACEMENT OF TISSUE EXPANDER AND FLEX HD (ACELLULAR HYDRATED DERMIS) Left 02/09/2013   Procedure: LEFT BREAST RECONSTRUCTION WITH PLACEMENT OF TISSUE EXPANDER ;  Surgeon: Crissie Reese, MD;  Location: [facility];  Service: Plastics;  Laterality: Left;  . EYE SURGERY Bilateral    cataract extraction with IOL  . LEFT HEART CATHETERIZATION WITH CORONARY ANGIOGRAM N/A 10/10/2014   Procedure: LEFT HEART CATHETERIZATION WITH CORONARY ANGIOGRAM;  Surgeon: Peter M Martinique, MD;  Location: [facility] CATH LAB;  Service: Cardiovascular;  Laterality: N/A;  . MASTECTOMY W/ SENTINEL NODE BIOPSY Left 02/09/2013   Procedure: TOTAL MASTECTOMY WITH SENTINEL LYMPH NODE BIOPSY;  Surgeon: Haywood Lasso, MD;  Location: [facility];  Service: General;  Laterality: Left;  . THYROIDECTOMY  1991  . TOTAL MASTECTOMY Left 02/09/2013   WITH TISSUE EXPANDER & BIOPSY  . WRIST FRACTURE  SURGERY      OB History    No data available       Home Medications    Prior to Admission medications   Medication Sig Start Date End Date Taking? Authorizing Provider  benzonatate (TESSALON) 100 MG capsule Take 1 capsule (100 mg total) by mouth every 8 (eight) hours. 09/20/17   Tasia Catchings, Jaquitta Dupriest V, PA-C  Calcium Carbonate-Vit D-Min (CALCIUM 1200 PO) Take 1,000 mg by mouth daily.     [provider]  fluticasone (FLONASE) 50 MCG/ACT nasal spray Place 2 sprays into both nostrils daily. 09/20/17   Tasia Catchings, Thelma Lorenzetti V, PA-C  levothyroxine (SYNTHROID, LEVOTHROID) 88 MCG tablet Take 1 tablet (88 mcg total) by mouth  daily. 06/24/17   Denita Lung, MD  lovastatin (MEVACOR) 40 MG tablet Take 40 mg by mouth at bedtime.    [provider]  nitroGLYCERIN (NITROSTAT) 0.4 MG SL tablet Place 1 tablet (0.4 mg total) under the tongue every 5 (five) minutes as needed for chest pain. 10/03/14   Martinique, Peter M, MD  omeprazole (PRILOSEC) 40 MG capsule Take 40 mg by mouth daily.  02/25/13   [provider]  propranolol (INDERAL) 40 MG tablet Take 40 mg by mouth daily.    [provider]  ranitidine (ZANTAC) 150 MG tablet Take 150 mg by mouth 2 (two) times daily.    [provider]  SUMAtriptan (IMITREX) 100 MG tablet Take 100 mg by mouth every 2 (two) hours as needed for migraine. May repeat in 2 hours if headache persists or recurs.    [provider]  tamoxifen (NOLVADEX) 20 MG tablet Take 1 tablet (20 mg total) by mouth daily. 09/10/17   Nicholas Lose, MD    Family History Family History  Problem Relation Age of Onset  . Colon cancer Brother 19  . Prostate cancer Brother 31  . Lung cancer Father 48       smoker  . Ovarian cancer Sister 15  . Brain cancer Sister 22  . Prostate cancer Brother 43  . Prostate cancer Brother 59  . Prostate cancer Brother 55  . Prostate cancer Brother 43  . Cancer Brother 24       metastatic; unknown primary    Social History Social History   Tobacco Use  . Smoking status: Never Smoker  . Smokeless tobacco: Never Used  Substance Use Topics  . Alcohol use: No  . Drug use: No     Allergies   Aspirin   Review of Systems Review of Systems  Reason unable to perform ROS: See HPI as above.     Physical Exam Triage Vital Signs ED Triage Vitals [09/20/17 1412]  Enc Vitals Group     BP (!) 104/58     Pulse Rate 77     Resp 16     Temp 98 F (36.7 C)     Temp src      SpO2 97 %     Weight      Height      Head Circumference      Peak Flow      Pain Score      Pain Loc      Pain Edu?      Excl. in [place]?    No data  found.  Updated Vital Signs BP (!) 104/58   Pulse 77   Temp 98 F (36.7 C)   Resp 16   SpO2 97%   Physical Exam  Constitutional:  She is oriented to person, place, and time. She appears well-developed and well-nourished. No distress.  HENT:  Head: Normocephalic and atraumatic.  Right Ear: External ear and ear canal normal. Tympanic membrane is erythematous. Tympanic membrane is not bulging.  Left Ear: External ear and ear canal normal. Tympanic membrane is erythematous. Tympanic membrane is not bulging.  Nose: Rhinorrhea present. Right sinus exhibits maxillary sinus tenderness. Right sinus exhibits no frontal sinus tenderness. Left sinus exhibits maxillary sinus tenderness. Left sinus exhibits no frontal sinus tenderness.  Mouth/Throat: Uvula is midline, oropharynx is clear and moist and mucous membranes are normal.  Eyes: Conjunctivae are normal. Pupils are equal, round, and reactive to light.  Neck: Normal range of motion. Neck supple.  Cardiovascular: Normal rate, regular rhythm and normal heart sounds. Exam reveals no gallop and no friction rub.  No murmur heard. Pulmonary/Chest: Effort normal and breath sounds normal. She has no decreased breath sounds. She has no wheezes. She has no rhonchi. She has no rales.  Lymphadenopathy:    She has no cervical adenopathy.  Neurological: She is alert and oriented to person, place, and time.  Skin: Skin is warm and dry.  Psychiatric: She has a normal mood and affect. Her behavior is normal. Judgment normal.     UC Treatments / Results  Labs (all labs ordered are listed, but only abnormal results are displayed) Labs Reviewed - No data to display  EKG  EKG Interpretation None       Radiology No results found.  Procedures Procedures (including critical care time)  Medications Ordered in UC Medications - No data to display   Initial Impression / Assessment and Plan / UC Course  I have reviewed the triage vital signs and the  nursing notes.  Pertinent labs & imaging results that were available during my care of the patient were reviewed by me and considered in my medical decision making (see chart for details).    Discussed with patient history and exam most consistent with viral URI. Symptomatic treatment as needed. Push fluids. Return precautions given.   Final Clinical Impressions(s) / UC Diagnoses   Final diagnoses:  Viral URI with cough    ED Discharge Orders        Ordered    benzonatate (TESSALON) 100 MG capsule  Every 8 hours     09/20/17 1509    fluticasone (FLONASE) 50 MCG/ACT nasal spray  Daily     09/20/17 1509        Ok Edwards, PA-C 09/20/17 1512

## 2017-09-20 NOTE — Telephone Encounter (Signed)
Called pt and no answer. Was made aware by front that pt called back to inform office that she went to urgent care. Thanks [employer]

## 2017-09-20 NOTE — Discharge Instructions (Signed)
Tessalon for cough. Start flonase for nasal congestion. You can use over the counter nasal saline rinse such as neti pot for nasal congestion. Keep hydrated, your urine should be clear to pale yellow in color. Tylenol/motrin for fever and pain. Monitor for any worsening of symptoms, chest pain, shortness of breath, wheezing, swelling of the throat, follow up for reevaluation.   For sore throat try using a honey-based tea. Use 3 teaspoons of honey with juice squeezed from half lemon. Place shaved pieces of ginger into 1/2-1 cup of water and warm over stove top. Then mix the ingredients and repeat every 4 hours as needed.

## 2017-09-20 NOTE — ED Triage Notes (Signed)
Pt c/o dry cough since Thursday, only pain with coughing. No fevers.

## 2018-03-28 ENCOUNTER — Other Ambulatory Visit: Payer: Self-pay | Admitting: Family Medicine

## 2018-03-28 DIAGNOSIS — Z1231 Encounter for screening mammogram for malignant neoplasm of breast: Secondary | ICD-10-CM

## 2018-03-29 ENCOUNTER — Other Ambulatory Visit: Payer: Self-pay | Admitting: Hematology and Oncology

## 2018-03-29 DIAGNOSIS — C50112 Malignant neoplasm of central portion of left female breast: Secondary | ICD-10-CM

## 2018-04-01 ENCOUNTER — Encounter: Payer: Self-pay | Admitting: Family Medicine

## 2018-04-01 ENCOUNTER — Ambulatory Visit (INDEPENDENT_AMBULATORY_CARE_PROVIDER_SITE_OTHER): Payer: Medicare Other | Admitting: Family Medicine

## 2018-04-01 VITALS — BP 124/82 | HR 51 | Temp 97.8°F | Wt 169.8 lb

## 2018-04-01 DIAGNOSIS — F329 Major depressive disorder, single episode, unspecified: Secondary | ICD-10-CM | POA: Diagnosis not present

## 2018-04-01 DIAGNOSIS — Z7189 Other specified counseling: Secondary | ICD-10-CM

## 2018-04-01 MED ORDER — CITALOPRAM HYDROBROMIDE 20 MG PO TABS
20.0000 mg | ORAL_TABLET | Freq: Every day | ORAL | 3 refills | Status: DC
Start: 2018-04-01 — End: 2018-04-29

## 2018-04-01 NOTE — Progress Notes (Signed)
   Subjective:    Patient ID: Ariel Henderson, female    DOB: 1946-01-22, 72 y.o.   MRN: [id number]  HPI She is here for consultation concerning difficulty dealing with the death of her husband.  He died the end of 10/08/2022 of this year.  She has done fairly well up until the last several weeks and has noted difficulty with her sleep as well as crying.  This is now starting to interfere with her ability to function. She has had no difficulty with weight and has no suicidal ideation.  Review of Systems     Objective:   Physical Exam Alert and in no distress but tearful.       Assessment & Plan:  Reactive depression - Plan: citalopram (CELEXA) 20 MG tablet  Bereavement counseling Counseled her concerning the fact that it is been just 6 months since he died in a lot of the symptoms and feeling that she is having are entirely normal.  Encouraged her to address them directly.  Recommend she get involved with hospice and bereavement counseling.  She will return here in 1 month.

## 2018-04-01 NOTE — Patient Instructions (Addendum)
Call Hospice [phone] for bereavement counseling

## 2018-04-21 ENCOUNTER — Ambulatory Visit
Admission: RE | Admit: 2018-04-21 | Discharge: 2018-04-21 | Disposition: A | Discharge status: Home / Self Care | Payer: Medicare Other | Source: Ambulatory Visit | Attending: Family Medicine | Admitting: Family Medicine

## 2018-04-21 DIAGNOSIS — Z1231 Encounter for screening mammogram for malignant neoplasm of breast: Secondary | ICD-10-CM

## 2018-04-29 ENCOUNTER — Ambulatory Visit (INDEPENDENT_AMBULATORY_CARE_PROVIDER_SITE_OTHER): Payer: Medicare Other | Admitting: Family Medicine

## 2018-04-29 ENCOUNTER — Encounter: Payer: Self-pay | Admitting: Family Medicine

## 2018-04-29 VITALS — BP 118/78 | HR 62 | Temp 98.2°F | Wt 170.0 lb

## 2018-04-29 DIAGNOSIS — F329 Major depressive disorder, single episode, unspecified: Secondary | ICD-10-CM

## 2018-04-29 DIAGNOSIS — Z7189 Other specified counseling: Secondary | ICD-10-CM | POA: Diagnosis not present

## 2018-04-29 MED ORDER — CITALOPRAM HYDROBROMIDE 40 MG PO TABS: 40.0000 mg | ORAL_TABLET | Freq: Every day | ORAL | 3 refills | Status: DC

## 2018-04-29 NOTE — Progress Notes (Signed)
   Subjective:    Patient ID: Ariel Henderson, female    DOB: 1946/08/15, 72 y.o.   MRN: [id number]  HPI She is here for a recheck.  She states that she has noted no real change since being placed on Celexa.  She has gotten involved in counseling and does plan to go to some group meetings.   Review of Systems     Objective:   Physical Exam Alert and in no distress but slightly tearful.  Appropriately dressed.       Assessment & Plan:  Reactive depression - Plan: citalopram (CELEXA) 40 MG tablet  Bereavement counseling I encouraged her to continue in counseling.  Explained that it still very early into the whole idea of her husband being gone and he can take a year or 2.  Recheck here in 1 month.

## 2018-05-10 ENCOUNTER — Other Ambulatory Visit: Payer: Self-pay | Admitting: Family Medicine

## 2018-05-11 MED ORDER — LOVASTATIN 40 MG PO TABS: 40.0000 mg | ORAL_TABLET | Freq: Every day | ORAL | 0 refills | Status: DC

## 2018-05-11 MED ORDER — PROPRANOLOL HCL 40 MG PO TABS: 40.0000 mg | ORAL_TABLET | Freq: Every day | ORAL | 0 refills | Status: DC

## 2018-05-11 MED ORDER — OMEPRAZOLE 40 MG PO CPDR: 40.0000 mg | DELAYED_RELEASE_CAPSULE | Freq: Every day | ORAL | 0 refills | Status: DC

## 2018-05-13 ENCOUNTER — Other Ambulatory Visit: Payer: Self-pay

## 2018-05-13 MED ORDER — LOVASTATIN 40 MG PO TABS: 40.0000 mg | ORAL_TABLET | Freq: Every day | ORAL | 0 refills | Status: DC

## 2018-05-13 MED ORDER — PROPRANOLOL HCL 40 MG PO TABS: 40.0000 mg | ORAL_TABLET | Freq: Every day | ORAL | 0 refills | Status: DC

## 2018-05-13 MED ORDER — OMEPRAZOLE 40 MG PO CPDR: 40.0000 mg | DELAYED_RELEASE_CAPSULE | Freq: Every day | ORAL | 0 refills | Status: DC

## 2018-05-31 ENCOUNTER — Ambulatory Visit (INDEPENDENT_AMBULATORY_CARE_PROVIDER_SITE_OTHER): Payer: Medicare Other | Admitting: Family Medicine

## 2018-05-31 ENCOUNTER — Encounter: Payer: Self-pay | Admitting: Family Medicine

## 2018-05-31 VITALS — BP 120/82 | HR 50 | Temp 98.5°F | Wt 169.2 lb

## 2018-05-31 DIAGNOSIS — F329 Major depressive disorder, single episode, unspecified: Secondary | ICD-10-CM

## 2018-05-31 DIAGNOSIS — Z7189 Other specified counseling: Secondary | ICD-10-CM

## 2018-05-31 DIAGNOSIS — Z23 Encounter for immunization: Secondary | ICD-10-CM | POA: Diagnosis not present

## 2018-05-31 NOTE — Progress Notes (Signed)
   Subjective:    Patient ID: Ariel Henderson, female    DOB: Oct 22, 1945, 72 y.o.   MRN: [id number]  HPI She is here for a recheck.  She has been involved in bereavement counseling and does feel better concerning this.  She plans to continue with this.  She thinks that the medication has helped her but is still not quite back to normal.   Review of Systems     Objective:   Physical Exam Alert and in no distress with appropriate affect and dressed appropriately.       Assessment & Plan:  Reactive depression  Bereavement counseling She will continue on Celexa and her other medications.  Return here in roughly 2 months for recheck and will probably do blood work at that point.

## 2018-05-31 NOTE — Addendum Note (Signed)
Addended by: Elyse Jarvis on: 05/31/2018 12:07 PM   Modules accepted: Orders

## 2018-06-29 ENCOUNTER — Other Ambulatory Visit: Payer: Self-pay | Admitting: Family Medicine

## 2018-07-28 ENCOUNTER — Telehealth: Payer: Self-pay | Admitting: Family Medicine

## 2018-07-28 DIAGNOSIS — F329 Major depressive disorder, single episode, unspecified: Secondary | ICD-10-CM

## 2018-07-28 MED ORDER — CITALOPRAM HYDROBROMIDE 40 MG PO TABS: 40.0000 mg | ORAL_TABLET | Freq: Every day | ORAL | 0 refills | Status: DC

## 2018-07-28 NOTE — Telephone Encounter (Signed)
[facility] req Citalopram tab #3

## 2018-08-08 ENCOUNTER — Encounter: Payer: Self-pay | Admitting: Family Medicine

## 2018-08-08 ENCOUNTER — Ambulatory Visit (INDEPENDENT_AMBULATORY_CARE_PROVIDER_SITE_OTHER): Payer: Medicare Other | Admitting: Family Medicine

## 2018-08-08 VITALS — BP 120/74 | HR 59 | Temp 97.5°F | Wt 170.8 lb

## 2018-08-08 DIAGNOSIS — Z7189 Other specified counseling: Secondary | ICD-10-CM | POA: Diagnosis not present

## 2018-08-08 DIAGNOSIS — F329 Major depressive disorder, single episode, unspecified: Secondary | ICD-10-CM

## 2018-08-08 MED ORDER — BUPROPION HCL ER (SR) 100 MG PO TB12
100.0000 mg | ORAL_TABLET | Freq: Every day | ORAL | 1 refills | Status: DC
Start: 2018-08-08 — End: 2018-09-19

## 2018-08-08 NOTE — Progress Notes (Signed)
   Subjective:    Patient ID: Ariel Henderson, female    DOB: Jan 29, 1946, 72 y.o.   MRN: [id number]  HPI She is here for recheck.  She has been going to hospice bereavement and does find it quite useful.  She states that she is 50% better.  She is happy with the progress that she has made.  Continues on Celexa.   Review of Systems     Objective:   Physical Exam Alert and in no distress with appropriate affect.  PHQ 9 score of 8      Assessment & Plan:  Reactive depression - Plan: buPROPion (WELLBUTRIN SR) 100 MG 12 hr tablet  Bereavement counseling - Plan: buPROPion (WELLBUTRIN SR) 100 MG 12 hr tablet I explained to her that her score is good but not as good as it should be.  Encouraged her to continue with going to hospice and to start doing positive things for herself especially with the holidays coming up.  I will add a small dose of Wellbutrin to her regimen.

## 2018-09-06 NOTE — Progress Notes (Signed)
Patient Care Team: Denita Lung, MD as PCP - General (Family Medicine)  DIAGNOSIS:    ICD-10-CM   1. Malignant neoplasm of central portion of left breast in female, estrogen receptor positive ([place]) C50.112 CBC with Differential ([place])   Z17.0 [place] ([facility] only)    SUMMARY OF ONCOLOGIC HISTORY:   Cancer of central portion of left female breast ([place])   12/29/2012 Surgery    Left breast lumpectomy: High-grade DCIS with comedo necrosis extending to the margins ER 0% PR 0%    02/09/2013 Surgery    Left breast mastectomy with reconstruction: DCIS high-grade with comedo necrosis 4 cm 2 SLN negative +1 additional lymph nodes negative, ER 0%, PR 0%    02/22/2013 Genetic Testing    VHL c.5C>G (p.Pro2Leu) VUS identified on the Comprehensive Cancer Panel.  The Comprehensive Common Cancer Panel offered by [facility] includes sequencing and/or deletion duplication testing of the following 29 genes: APC, ATM, AXIN2, BARD1, BMPR1A, BRCA1, BRCA2, BRIP1, CDH1, CDK4, CDKN2A, CHEK2, EPCAM, FANCC, MLH1, MSH2, MSH6, MUTYH, NBN, PALB2, PMS2, PTEN, RAD51C, RAD51D, SMAD4, STK11, TP53, and VHL.   The report date is February 22, 2013.  UPDATE: VHL c.5C>G (p.Pro2Leu) has been reclassified from a variant of uncertain significance to a likely benign variant based on a combination of sources, e.g., internal data, published literature, population databases and in silico models.  The updated report date is March 30, 2017.     03/27/2013 -  Anti-estrogen oral therapy    Tamoxifen 20 mg daily     CHIEF COMPLIANT: Follow-up on tamoxifen therapy  INTERVAL HISTORY: Ariel Henderson is a 72 y.o. with above-mentioned history of left breast DCIS treated with mastectomy and is here currently receiving tamoxifen since July 2014. I last saw the patient one year ago. Her most recent mammogram on 04/21/18 showed no evidence of malignancy bilaterally. She presents to the clinic today alone and notes her husband passed  away recently which has been very difficult for her, but otherwise her health has been good. She notes occasional pain in her left breast where she had reconstruction surgery.   REVIEW OF SYSTEMS:   Constitutional: Denies fevers, chills or abnormal weight loss Eyes: Denies blurriness of vision Ears, nose, mouth, throat, and face: Denies mucositis or sore throat Respiratory: Denies cough, dyspnea or wheezes Cardiovascular: Denies palpitation, chest discomfort Gastrointestinal:  Denies nausea, heartburn or change in bowel habits Skin: Denies abnormal skin rashes Lymphatics: Denies new lymphadenopathy or easy bruising Neurological:Denies numbness, tingling or new weaknesses Behavioral/Psych: Mood is stable, no new changes  Extremities: No lower extremity edema Breast: denies any lumps or nodules in either breasts (+) occasional pain in left breast All other systems were reviewed with the patient and are negative.  I have reviewed the past medical history, past surgical history, social history and family history with the patient and they are unchanged from previous note.  ALLERGIES:  is allergic to aspirin.  MEDICATIONS:  Current Outpatient Medications  Medication Sig Dispense Refill  . benzonatate (TESSALON) 100 MG capsule Take 1 capsule (100 mg total) by mouth every 8 (eight) hours. (Patient not taking: Reported on 04/01/2018) 21 capsule 0  . buPROPion (WELLBUTRIN SR) 100 MG 12 hr tablet Take 1 tablet (100 mg total) by mouth daily. 90 tablet 1  . Calcium Carbonate-Vit D-Min (CALCIUM 1200 PO) Take 1,000 mg by mouth daily.     . citalopram (CELEXA) 40 MG tablet Take 1 tablet (40 mg total) by mouth daily.  90 tablet 0  . fluticasone (FLONASE) 50 MCG/ACT nasal spray Place 2 sprays into both nostrils daily. (Patient not taking: Reported on 04/01/2018) 1 g 0  . levothyroxine (SYNTHROID, LEVOTHROID) 88 MCG tablet TAKE 1 TABLET BY MOUTH  DAILY 90 tablet 0  . lovastatin (MEVACOR) 40 MG tablet Take 1  tablet (40 mg total) by mouth at bedtime. 90 tablet 0  . lovastatin (MEVACOR) 40 MG tablet TAKE 1 TABLET BY MOUTH AT  BEDTIME 90 tablet 0  . nitroGLYCERIN (NITROSTAT) 0.4 MG SL tablet Place 1 tablet (0.4 mg total) under the tongue every 5 (five) minutes as needed for chest pain. 25 tablet 6  . omeprazole (PRILOSEC) 40 MG capsule Take 1 capsule (40 mg total) by mouth daily. 90 capsule 0  . propranolol (INDERAL) 40 MG tablet Take 1 tablet (40 mg total) by mouth daily. 90 tablet 0  . propranolol (INDERAL) 40 MG tablet TAKE 1 TABLET BY MOUTH  DAILY 90 tablet 0  . ranitidine (ZANTAC) 150 MG tablet Take 150 mg by mouth 2 (two) times daily.    . SUMAtriptan (IMITREX) 100 MG tablet Take 100 mg by mouth every 2 (two) hours as needed for migraine. May repeat in 2 hours if headache persists or recurs.    . tamoxifen (NOLVADEX) 20 MG tablet TAKE 1 TABLET BY MOUTH  DAILY 90 tablet 1   No current facility-administered medications for this visit.     PHYSICAL EXAMINATION: ECOG PERFORMANCE STATUS: 1 - Symptomatic but completely ambulatory  Vitals:   09/08/18 1128  BP: 120/89  Pulse: 71  Resp: 16  Temp: (!) 97 F (36.1 C)  SpO2: 97%   Filed Weights   09/08/18 1128  Weight: 171 lb 8 oz (77.8 kg)    GENERAL:alert, no distress and comfortable SKIN: skin color, texture, turgor are normal, no rashes or significant lesions EYES: normal, Conjunctiva are pink and non-injected, sclera clear OROPHARYNX:no exudate, no erythema and lips, buccal mucosa, and tongue normal  NECK: supple, thyroid normal size, non-tender, without nodularity LYMPH:  no palpable lymphadenopathy in the cervical, axillary or inguinal LUNGS: clear to auscultation and percussion with normal breathing effort HEART: regular rate & rhythm and no murmurs and no lower extremity edema ABDOMEN:abdomen soft, non-tender and normal bowel sounds MUSCULOSKELETAL:no cyanosis of digits and no clubbing  NEURO: alert & oriented x 3 with fluent  speech, no focal motor/sensory deficits EXTREMITIES: No lower extremity edema BREAST: No palpable masses or nodules in either right or left breasts. No palpable axillary supraclavicular or infraclavicular adenopathy no breast tenderness or nipple discharge. (exam performed in the presence of a chaperone)  LABORATORY DATA:  I have reviewed the data as listed CMP Latest Ref Rng & Units 09/08/2018 09/10/2017 09/10/2016  Glucose 70 - 99 mg/dL 112(H) 106 97  BUN 8 - 23 mg/dL 11 17.1 18.9  Creatinine 0.44 - 1.00 mg/dL 0.78 0.7 0.8  Sodium 135 - 145 mmol/L 141 138 140  Potassium 3.5 - 5.1 mmol/L 4.1 4.4 4.6  Chloride 98 - 111 mmol/L 106 - -  CO2 22 - 32 mmol/L 26 23 24   Calcium 8.9 - 10.3 mg/dL 9.0 9.2 9.2  Total Protein 6.5 - 8.1 g/dL 6.7 6.6 6.7  Total Bilirubin 0.3 - 1.2 mg/dL 0.3 <0.22 0.25  Alkaline Phos 38 - 126 U/L 50 51 68  AST 15 - 41 U/L 20 20 20   ALT 0 - 44 U/L 18 22 17     Lab Results  Component Value Date  WBC 10.0 09/08/2018   HGB 12.9 09/08/2018   HCT 40.8 09/08/2018   MCV 92.1 09/08/2018   PLT 315 09/08/2018   NEUTROABS 5.9 09/08/2018    ASSESSMENT & PLAN:  Cancer of central portion of left female breast DCIS left breast diagnosed in 2014 status post mastectomy after the initial lumpectomy had positive margins. She underwent breast reconstruction. Tamoxifen July 2014-July 2019  Breast Cancer Surveillance: 1. Breast exam1/10/2018: Normal , tenderness in the left axilla. No palpable nodules or lumps. 2. Mammogram8/15/2019: No abnormalities. Postsurgical changes. Breast Density CategoryB.  Patient's husband passed away last 10/17/22 and she is still grieving his loss.  RTC in 1 year with labs.  In spite of my discussions, patient prefers to have blood work done every year.     Orders Placed This Encounter  Procedures  . CBC with Differential (Cancer Center Only)    Standing Status:   Future    Standing Expiration Date:   09/09/2019  . CMP ([facility] only)     Standing Status:   Future    Standing Expiration Date:   09/09/2019   The patient has a good understanding of the overall plan. she agrees with it. she will call with any problems that may develop before the next visit here.  Nicholas Lose, MD 09/08/2018   I, Cloyde Reams Dorshimer, am acting as scribe for Nicholas Lose, MD.  I have reviewed the above documentation for accuracy and completeness, and I agree with the above.

## 2018-09-08 ENCOUNTER — Telehealth: Payer: Self-pay | Admitting: Hematology and Oncology

## 2018-09-08 ENCOUNTER — Inpatient Hospital Stay: Payer: Medicare Other | Attending: Hematology and Oncology

## 2018-09-08 ENCOUNTER — Inpatient Hospital Stay (HOSPITAL_BASED_OUTPATIENT_CLINIC_OR_DEPARTMENT_OTHER): Payer: Medicare Other | Admitting: Hematology and Oncology

## 2018-09-08 DIAGNOSIS — C50112 Malignant neoplasm of central portion of left female breast: Secondary | ICD-10-CM

## 2018-09-08 DIAGNOSIS — Z170 Estrogen receptor positive status [ER+]: Secondary | ICD-10-CM | POA: Insufficient documentation

## 2018-09-08 DIAGNOSIS — Z9012 Acquired absence of left breast and nipple: Secondary | ICD-10-CM | POA: Insufficient documentation

## 2018-09-08 DIAGNOSIS — Z853 Personal history of malignant neoplasm of breast: Secondary | ICD-10-CM | POA: Diagnosis not present

## 2018-09-08 DIAGNOSIS — Z9223 Personal history of estrogen therapy: Secondary | ICD-10-CM

## 2018-09-08 LAB — CBC WITH DIFFERENTIAL/PLATELET
Abs Immature Granulocytes: 0.13 10*3/uL — ABNORMAL HIGH (ref 0.00–0.07)
Basophils Absolute: 0.1 10*3/uL (ref 0.0–0.1)
Basophils Relative: 1 %
EOS PCT: 2 %
Eosinophils Absolute: 0.2 10*3/uL (ref 0.0–0.5)
HEMATOCRIT: 40.8 % (ref 36.0–46.0)
HEMOGLOBIN: 12.9 g/dL (ref 12.0–15.0)
Immature Granulocytes: 1 %
LYMPHS ABS: 3.1 10*3/uL (ref 0.7–4.0)
LYMPHS PCT: 31 %
MCH: 29.1 pg (ref 26.0–34.0)
MCHC: 31.6 g/dL (ref 30.0–36.0)
MCV: 92.1 fL (ref 80.0–100.0)
Monocytes Absolute: 0.6 10*3/uL (ref 0.1–1.0)
Monocytes Relative: 6 %
Neutro Abs: 5.9 10*3/uL (ref 1.7–7.7)
Neutrophils Relative %: 59 %
Platelets: 315 10*3/uL (ref 150–400)
RBC: 4.43 MIL/uL (ref 3.87–5.11)
RDW: 13.8 % (ref 11.5–15.5)
WBC: 10 10*3/uL (ref 4.0–10.5)
nRBC: 0 % (ref 0.0–0.2)

## 2018-09-08 LAB — COMPREHENSIVE METABOLIC PANEL
ALK PHOS: 50 U/L (ref 38–126)
ALT: 18 U/L (ref 0–44)
AST: 20 U/L (ref 15–41)
Albumin: 3.6 g/dL (ref 3.5–5.0)
Anion gap: 9 (ref 5–15)
BUN: 11 mg/dL (ref 8–23)
CALCIUM: 9 mg/dL (ref 8.9–10.3)
CHLORIDE: 106 mmol/L (ref 98–111)
CO2: 26 mmol/L (ref 22–32)
CREATININE: 0.78 mg/dL (ref 0.44–1.00)
Glucose, Bld: 112 mg/dL — ABNORMAL HIGH (ref 70–99)
Potassium: 4.1 mmol/L (ref 3.5–5.1)
Sodium: 141 mmol/L (ref 135–145)
Total Bilirubin: 0.3 mg/dL (ref 0.3–1.2)
Total Protein: 6.7 g/dL (ref 6.5–8.1)

## 2018-09-08 NOTE — Assessment & Plan Note (Signed)
DCIS left breast diagnosed in 2014 status post mastectomy after the initial lumpectomy had positive margins. She underwent breast reconstruction. Tamoxifen July 2014-July 2019  Breast Cancer Surveillance: 1. Breast exam1/10/2018: Normal , tenderness in the left axilla. No palpable nodules or lumps. 2. Mammogram8/15/2019: No abnormalities. Postsurgical changes. Breast Density CategoryB.  RTC in 1 year with labs.  In spite of my discussions, patient prefers to have blood work done every year.

## 2018-09-08 NOTE — Telephone Encounter (Signed)
Gave avs and calendar ° °

## 2018-09-19 ENCOUNTER — Encounter: Payer: Self-pay | Admitting: Family Medicine

## 2018-09-19 ENCOUNTER — Ambulatory Visit (INDEPENDENT_AMBULATORY_CARE_PROVIDER_SITE_OTHER): Payer: Medicare Other | Admitting: Family Medicine

## 2018-09-19 VITALS — BP 122/80 | HR 69 | Temp 97.9°F | Wt 171.4 lb

## 2018-09-19 DIAGNOSIS — F329 Major depressive disorder, single episode, unspecified: Secondary | ICD-10-CM

## 2018-09-19 MED ORDER — BUPROPION HCL ER (XL) 150 MG PO TB24: 150.0000 mg | ORAL_TABLET | Freq: Every day | ORAL | 1 refills | Status: DC

## 2018-09-19 NOTE — Progress Notes (Signed)
   Subjective:    Patient ID: Ariel Henderson, female    DOB: [birth year], 73 y.o.   MRN: [id number]  HPI She is here for follow-up on her depression.  She states that she is feeling better but not quite back to normal.  It is almost been a year since her husband died.  She has been to bereavement counseling and did find it useful.   Review of Systems     Objective:   Physical Exam Alert and in no distress with appropriate affect.  PHQ 9 is 5       Assessment & Plan:  Reactive depression - Plan: buPROPion (WELLBUTRIN XL) 150 MG 24 hr tablet I will increase her Wellbutrin to 150 and continue on the Celexa.  Recheck here in about 3 months.

## 2018-09-28 ENCOUNTER — Other Ambulatory Visit: Payer: Self-pay | Admitting: Family Medicine

## 2018-09-28 ENCOUNTER — Other Ambulatory Visit: Payer: Self-pay | Admitting: Hematology and Oncology

## 2018-09-28 DIAGNOSIS — C50112 Malignant neoplasm of central portion of left female breast: Secondary | ICD-10-CM

## 2018-09-28 DIAGNOSIS — F329 Major depressive disorder, single episode, unspecified: Secondary | ICD-10-CM

## 2018-09-28 NOTE — Telephone Encounter (Signed)
[facility] is requesting to fill pt celexa. Please advise. [place]

## 2019-01-02 ENCOUNTER — Ambulatory Visit: Payer: Medicare Other | Admitting: Family Medicine

## 2019-01-05 ENCOUNTER — Encounter: Payer: Self-pay | Admitting: Family Medicine

## 2019-01-05 ENCOUNTER — Ambulatory Visit (INDEPENDENT_AMBULATORY_CARE_PROVIDER_SITE_OTHER): Payer: Medicare Other | Admitting: Family Medicine

## 2019-01-05 ENCOUNTER — Other Ambulatory Visit: Payer: Self-pay

## 2019-01-05 VITALS — Wt 169.0 lb

## 2019-01-05 DIAGNOSIS — I10 Essential (primary) hypertension: Secondary | ICD-10-CM | POA: Diagnosis not present

## 2019-01-05 DIAGNOSIS — F329 Major depressive disorder, single episode, unspecified: Secondary | ICD-10-CM

## 2019-01-05 DIAGNOSIS — Z0000 Encounter for general adult medical examination without abnormal findings: Secondary | ICD-10-CM | POA: Diagnosis not present

## 2019-01-05 DIAGNOSIS — M1990 Unspecified osteoarthritis, unspecified site: Secondary | ICD-10-CM

## 2019-01-05 NOTE — Progress Notes (Signed)
Ariel Henderson is a 73 y.o. female who presents for annual wellness visit and follow-up on chronic medical conditions.  She has the following concerns:  Immunizations and Health Maintenance Immunization History  Administered Date(s) Administered  . Influenza Split 04/17/2013, 06/28/2013, 05/11/2014  . Influenza, High Dose Seasonal PF 05/03/2017, 05/31/2018  . Influenza-Unspecified 07/23/2010, 05/22/2011, 05/13/2012  . Pneumococcal Conjugate-13 09/28/2013, 01/19/2015  . Pneumococcal Polysaccharide-23 10/23/2010  . Td 02/13/2002, 04/27/2012  . Zoster 10/28/2011  . Zoster Recombinat (Shingrix) 12/17/2016, 02/26/2017   Health Maintenance Due  Topic Date Due  . Hepatitis C Screening  Jul 05, 1946  . COLONOSCOPY  02/28/1995  . DEXA SCAN  02/27/2010    Last Pap smear: aged out  Last mammogram:04/21/18 Last colonoscopy: unkown Last DEXA:never Dentist:10/2018 Ophtho: Five years ago Exercise:walking  Other doctors caring for patient include:  Advanced directives:    Depression screen:  See questionnaire below.  Depression screen [facility] 2/9 09/19/2018 08/08/2018 04/01/2018 06/23/2017 03/16/2017  Decreased Interest 0 2 2 0 0  Down, Depressed, Hopeless 1 1 1  0 0  PHQ - 2 Score 1 3 3  0 0  Altered sleeping 3 2 3  - -  Tired, decreased energy 0 0 2 - -  Change in appetite 0 0 2 - -  Feeling bad or failure about yourself  0 0 0 - -  Trouble concentrating 1 3 0 - -  Moving slowly or fidgety/restless 0 0 0 - -  Suicidal thoughts 0 0 0 - -  PHQ-9 Score 5 8 10  - -  Difficult doing work/chores Somewhat difficult Somewhat difficult Somewhat difficult - -    Fall Risk Screen: see questionnaire below. Fall Risk  06/23/2017 03/16/2017 08/14/2014  Falls in the past year? No No No    ADL screen:  See questionnaire below Functional Status Survey:     Review of Systems Constitutional: -, -unexpected weight change, -anorexia, -fatigue Allergy: -sneezing, -itching, -congestion Dermatology: denies  changing moles, rash, lumps ENT: -runny nose, -ear pain, -sore throat,  Cardiology:  -chest pain, -palpitations, -orthopnea, Respiratory: -cough, -shortness of breath, -dyspnea on exertion, -wheezing,  Gastroenterology: -abdominal pain, -nausea, -vomiting, -diarrhea, -constipation, -dysphagia Hematology: -bleeding or bruising problems Musculoskeletal: -arthralgias, -myalgias, -joint swelling, -back pain, - Ophthalmology: -vision changes,  Urology: -dysuria, -difficulty urinating,  -urinary frequency, -urgency, incontinence Neurology: -, -numbness, , -memory loss, -falls, -dizziness    PHYSICAL EXAM:  Wt 169 lb (76.7 kg)   BMI 29.94 kg/m   General Appearance: Alert, cooperative, no distress, appears stated age Head: Normocephalic, without obvious abnormality, atraumatic Eyes: PERRL, conjunctiva/corneas clear, EOM's intact, fundi benign Ears: Normal TM's and external ear canals Nose: Nares normal, mucosa normal, no drainage or sinus tenderness Throat: Lips, mucosa, and tongue normal; teeth and gums normal Neck: Supple, no lymphadenopathy;  thyroid:  no enlargement/tenderness/nodules; no carotid bruit or JVD Lungs: Clear to auscultation bilaterally without wheezes, rales or ronchi; respirations unlabored Heart: Regular rate and rhythm, S1 and S2 normal, no murmur, rubor gallop Abdomen: Soft, non-tender, nondistended, normoactive bowel sounds,  no masses, no hepatosplenomegaly Extremities: No clubbing, cyanosis or edema Pulses: 2+ and symmetric all extremities Skin:  Skin color, texture, turgor normal, no rashes or lesions Lymph nodes: Cervical, supraclavicular, and axillary nodes normal Neurologic:  CNII-XII intact, normal strength, sensation and gait; reflexes 2+ and symmetric throughout Psych: Normal mood, affect, hygiene and grooming.  ASSESSMENT/PLAN:    Discussed monthly self breast exams and yearly mammograms; at least 30 minutes of aerobic activity at least 5 days/week  and  weight-bearing exercise 2x/week; proper sunscreen use reviewed; healthy diet, including goals of calcium and vitamin D intake and alcohol recommendations (less than or equal to 1 drink/day) reviewed; regular seatbelt use; changing batteries in smoke detectors.  Immunization recommendations discussed.  Colonoscopy recommendations reviewed   Medicare Attestation I have personally reviewed: The patient's medical and social history Their use of alcohol, tobacco or illicit drugs Their current medications and supplements The patient's functional ability including ADLs,fall risks, home safety risks, cognitive, and hearing and visual impairment Diet and physical activities Evidence for depression or mood disorders  The patient's weight, height, and BMI have been recorded in the chart.  I have made referrals, counseling, and provided education to the patient based on review of the above and I have provided the patient with a written personalized care plan for preventive services.     Jill Alexanders, MD   01/05/2019

## 2019-04-27 ENCOUNTER — Other Ambulatory Visit: Payer: Self-pay

## 2019-04-27 ENCOUNTER — Telehealth: Payer: Self-pay | Admitting: Family Medicine

## 2019-04-27 DIAGNOSIS — F329 Major depressive disorder, single episode, unspecified: Secondary | ICD-10-CM

## 2019-04-27 MED ORDER — PROPRANOLOL HCL 40 MG PO TABS: 40.0000 mg | ORAL_TABLET | Freq: Every day | ORAL | 0 refills | Status: DC

## 2019-04-27 MED ORDER — LOVASTATIN 40 MG PO TABS: 40.0000 mg | ORAL_TABLET | Freq: Every day | ORAL | 0 refills | Status: DC

## 2019-04-27 MED ORDER — LEVOTHYROXINE SODIUM 88 MCG PO TABS: 88.0000 ug | ORAL_TABLET | Freq: Every day | ORAL | 1 refills | Status: DC

## 2019-04-27 MED ORDER — PROPRANOLOL HCL 40 MG PO TABS: 40.0000 mg | ORAL_TABLET | Freq: Every day | ORAL | 1 refills | Status: DC

## 2019-04-27 MED ORDER — CITALOPRAM HYDROBROMIDE 40 MG PO TABS: 40.0000 mg | ORAL_TABLET | Freq: Every day | ORAL | 1 refills | Status: DC

## 2019-04-27 MED ORDER — LOVASTATIN 40 MG PO TABS: 40.0000 mg | ORAL_TABLET | Freq: Every day | ORAL | 1 refills | Status: DC

## 2019-04-27 MED ORDER — BUPROPION HCL ER (XL) 150 MG PO TB24: 150.0000 mg | ORAL_TABLET | Freq: Every day | ORAL | 1 refills | Status: DC

## 2019-04-27 MED ORDER — LEVOTHYROXINE SODIUM 88 MCG PO TABS: 88.0000 ug | ORAL_TABLET | Freq: Every day | ORAL | 0 refills | Status: DC

## 2019-04-27 NOTE — Telephone Encounter (Signed)
Please advised if pt can have a refill on pt celexa and wellbutrin. [place]

## 2019-04-27 NOTE — Telephone Encounter (Signed)
Pt called and states she needs refills of all medications. [facility]

## 2019-04-27 NOTE — Telephone Encounter (Signed)
Set her up for an appt in a month or two

## 2019-04-28 NOTE — Telephone Encounter (Signed)
Appointment has been made

## 2019-05-16 ENCOUNTER — Other Ambulatory Visit: Payer: Self-pay

## 2019-05-16 MED ORDER — LOVASTATIN 40 MG PO TABS: 40.0000 mg | ORAL_TABLET | Freq: Every day | ORAL | 1 refills | Status: DC

## 2019-05-22 ENCOUNTER — Telehealth: Payer: Self-pay

## 2019-05-22 NOTE — Telephone Encounter (Signed)
LVM for pt to call back to advise pt need for covid screening question. [place]

## 2019-05-29 ENCOUNTER — Ambulatory Visit (INDEPENDENT_AMBULATORY_CARE_PROVIDER_SITE_OTHER): Payer: Medicare Other | Admitting: Family Medicine

## 2019-05-29 ENCOUNTER — Telehealth: Payer: Self-pay

## 2019-05-29 ENCOUNTER — Other Ambulatory Visit: Payer: Self-pay

## 2019-05-29 ENCOUNTER — Encounter: Payer: Medicare Other | Admitting: Family Medicine

## 2019-05-29 ENCOUNTER — Encounter: Payer: Self-pay | Admitting: Family Medicine

## 2019-05-29 VITALS — Wt 169.0 lb

## 2019-05-29 DIAGNOSIS — E890 Postprocedural hypothyroidism: Secondary | ICD-10-CM | POA: Diagnosis not present

## 2019-05-29 DIAGNOSIS — F419 Anxiety disorder, unspecified: Secondary | ICD-10-CM

## 2019-05-29 DIAGNOSIS — E785 Hyperlipidemia, unspecified: Secondary | ICD-10-CM

## 2019-05-29 DIAGNOSIS — F325 Major depressive disorder, single episode, in full remission: Secondary | ICD-10-CM

## 2019-05-29 DIAGNOSIS — M1990 Unspecified osteoarthritis, unspecified site: Secondary | ICD-10-CM

## 2019-05-29 DIAGNOSIS — I10 Essential (primary) hypertension: Secondary | ICD-10-CM

## 2019-05-29 DIAGNOSIS — C50919 Malignant neoplasm of unspecified site of unspecified female breast: Secondary | ICD-10-CM

## 2019-05-29 NOTE — Telephone Encounter (Signed)
Per jcl he would like to follow up in a few months. No answer lvm. [place]

## 2019-05-29 NOTE — Progress Notes (Signed)
   Subjective:    Patient ID: Ariel Henderson, female    DOB: 07/10/1946, 73 y.o.   MRN: [id number]  HPI Documentation for virtual telephone encounter.  Documentation for virtual audio and video telecommunications through [place] encounter: The patient was located at home. The provider was located in the office. The patient did consent to this visit and is aware of possible charges through their insurance for this visit. The other persons participating in this telemedicine service were none. Time spent on call was 5 minutes and in review of previous records >35 minutes total. Video conference was attempted however patient was unable to connect properly. This virtual service is not related to other E/M service within previous 7 days. This is a medication check visit.  She continues on Nolvadex and plans to see her oncologist in January.  She continues on lovastatin and is having no myalgias or malaise.  She is also taking Synthroid and is vigilant with taking this regularly.  No cold intolerance, skin or hair changes.  She is taking Zantac for her reflux on a daily basis.  She continues on Inderal and occasionally uses Imitrex but has not had any need for it recently.  She has been on Celexa and Wellbutrin since her husband died and she states that she seems to be holding her own.  She was scheduled to come in here today however she is having URI symptoms.  Review of Systems     Objective:   Physical Exam Alert and in no distress otherwise not examined       Assessment & Plan:  Postoperative hypothyroidism  Anxiety  Major depression in remission ([place])  Hyperlipidemia, unspecified hyperlipidemia type  Essential hypertension  Arthritis  Malignant neoplasm of female breast, unspecified estrogen receptor status, unspecified laterality, unspecified site of breast ([place]) She will continue on her present medication regimen.  She seems to be stable on all of her medications.   Discussed the antidepressant but at this time, probably not appropriate even think a lot taken her off of it until next year.  Did recommend that she get COVID testing otherwise recheck here in 2 months.  Approximately 35 minutes spent discussing this with her, greater than 50% spent in counseling and coordination of care Return here in 2 months for blood work.

## 2019-07-14 ENCOUNTER — Telehealth: Payer: Self-pay

## 2019-07-14 NOTE — Telephone Encounter (Signed)
Received fax from [facility]. Pt. Needs a refill on Synthroid last apt. Was 05/29/19

## 2019-07-17 ENCOUNTER — Other Ambulatory Visit: Payer: Self-pay

## 2019-07-17 MED ORDER — LEVOTHYROXINE SODIUM 88 MCG PO TABS: 88.0000 ug | ORAL_TABLET | Freq: Every day | ORAL | 1 refills | Status: DC

## 2019-07-17 NOTE — Telephone Encounter (Signed)
Done KH 

## 2019-08-15 ENCOUNTER — Ambulatory Visit (INDEPENDENT_AMBULATORY_CARE_PROVIDER_SITE_OTHER): Payer: Medicare Other | Admitting: Family Medicine

## 2019-08-15 ENCOUNTER — Encounter: Payer: Self-pay | Admitting: Family Medicine

## 2019-08-15 ENCOUNTER — Other Ambulatory Visit: Payer: Self-pay

## 2019-08-15 VITALS — BP 158/94 | HR 79 | Temp 98.8°F | Wt 179.4 lb

## 2019-08-15 DIAGNOSIS — Z23 Encounter for immunization: Secondary | ICD-10-CM

## 2019-08-15 DIAGNOSIS — C50919 Malignant neoplasm of unspecified site of unspecified female breast: Secondary | ICD-10-CM | POA: Diagnosis not present

## 2019-08-15 DIAGNOSIS — F329 Major depressive disorder, single episode, unspecified: Secondary | ICD-10-CM | POA: Diagnosis not present

## 2019-08-15 DIAGNOSIS — E785 Hyperlipidemia, unspecified: Secondary | ICD-10-CM | POA: Diagnosis not present

## 2019-08-15 DIAGNOSIS — Z85850 Personal history of malignant neoplasm of thyroid: Secondary | ICD-10-CM

## 2019-08-15 DIAGNOSIS — I10 Essential (primary) hypertension: Secondary | ICD-10-CM

## 2019-08-15 NOTE — Progress Notes (Signed)
   Subjective:    Patient ID: Ariel Henderson, female    DOB: October 23, 1945, 73 y.o.   MRN: [id number]  HPI She is here for a med check appointment.  She does have a follow-up appointment with her oncologist.  She continues on her thyroid medication and is having no difficulty with that.  She does use Zantac on an as-needed basis.  She continues on propranolol and is having no difficulty.  She is also taking lovastatin without any aches or pains.  She is also taking Celexa and Wellbutrin.  Psychologically C seems to be holding her own but is planning on moving.  She states that she still has bad days but seems to have less of them.   Review of Systems     Objective:   Physical Exam Alert and in no distress. Tympanic membranes and canals are normal. Pharyngeal area is normal. Neck is supple without adenopathy or thyromegaly. Cardiac exam shows a regular sinus rhythm without murmurs or gallops. Lungs are clear to auscultation.        Assessment & Plan:  Reactive depression  Essential hypertension - Plan: CBC with Differential, Comprehensive metabolic panel  Malignant neoplasm of female breast, unspecified estrogen receptor status, unspecified laterality, unspecified site of breast ([place])  History of thyroid cancer - Plan: TSH  Hyperlipidemia, unspecified hyperlipidemia type - Plan: Lipid panel  Need for influenza vaccination - Plan: Flu Vaccine QUAD High Dose(Fluad) She will continue on her present medication regimen.  Plan to see her in about 3 or 4 months.  She was comfortable with that.

## 2019-08-16 LAB — LIPID PANEL
Chol/HDL Ratio: 4 ratio (ref 0.0–4.4)
Cholesterol, Total: 212 mg/dL — ABNORMAL HIGH (ref 100–199)
HDL: 53 mg/dL (ref >39)
LDL Chol Calc (NIH): 126 mg/dL — ABNORMAL HIGH (ref 0–99)
Triglycerides: 188 mg/dL — ABNORMAL HIGH (ref 0–149)
VLDL Cholesterol Cal: 33 mg/dL (ref 5–40)

## 2019-08-16 LAB — COMPREHENSIVE METABOLIC PANEL
ALT: 13 IU/L (ref 0–32)
AST: 15 IU/L (ref 0–40)
Albumin/Globulin Ratio: 1.8 (ref 1.2–2.2)
Albumin: 4.1 g/dL (ref 3.7–4.7)
Alkaline Phosphatase: 54 IU/L (ref 39–117)
BUN/Creatinine Ratio: 16 (ref 12–28)
BUN: 12 mg/dL (ref 8–27)
Bilirubin Total: 0.2 mg/dL (ref 0.0–1.2)
CO2: 24 mmol/L (ref 20–29)
Calcium: 8.8 mg/dL (ref 8.7–10.3)
Chloride: 104 mmol/L (ref 96–106)
Creatinine, Ser: 0.73 mg/dL (ref 0.57–1.00)
GFR calc Af Amer: 94 mL/min/{1.73_m2} (ref >59)
GFR calc non Af Amer: 81 mL/min/{1.73_m2} (ref >59)
Globulin, Total: 2.3 g/dL (ref 1.5–4.5)
Glucose: 93 mg/dL (ref 65–99)
Potassium: 4.6 mmol/L (ref 3.5–5.2)
Sodium: 141 mmol/L (ref 134–144)
Total Protein: 6.4 g/dL (ref 6.0–8.5)

## 2019-08-16 LAB — CBC WITH DIFFERENTIAL/PLATELET
Basophils Absolute: 0.1 10*3/uL (ref 0.0–0.2)
Basos: 1 %
EOS (ABSOLUTE): 0.2 10*3/uL (ref 0.0–0.4)
Eos: 2 %
Hematocrit: 36.5 % (ref 34.0–46.6)
Hemoglobin: 12.3 g/dL (ref 11.1–15.9)
Immature Grans (Abs): 0.2 10*3/uL — ABNORMAL HIGH (ref 0.0–0.1)
Immature Granulocytes: 2 %
Lymphocytes Absolute: 2.9 10*3/uL (ref 0.7–3.1)
Lymphs: 35 %
MCH: 29.6 pg (ref 26.6–33.0)
MCHC: 33.7 g/dL (ref 31.5–35.7)
MCV: 88 fL (ref 79–97)
Monocytes Absolute: 0.6 10*3/uL (ref 0.1–0.9)
Monocytes: 7 %
Neutrophils Absolute: 4.4 10*3/uL (ref 1.4–7.0)
Neutrophils: 53 %
Platelets: 291 10*3/uL (ref 150–450)
RBC: 4.16 x10E6/uL (ref 3.77–5.28)
RDW: 13 % (ref 11.7–15.4)
WBC: 8.2 10*3/uL (ref 3.4–10.8)

## 2019-08-16 LAB — TSH: TSH: 41.7 u[IU]/mL — ABNORMAL HIGH (ref 0.450–4.500)

## 2019-08-31 ENCOUNTER — Other Ambulatory Visit: Payer: Self-pay | Admitting: Family Medicine

## 2019-08-31 ENCOUNTER — Other Ambulatory Visit: Payer: Self-pay | Admitting: Hematology and Oncology

## 2019-08-31 DIAGNOSIS — F329 Major depressive disorder, single episode, unspecified: Secondary | ICD-10-CM

## 2019-08-31 DIAGNOSIS — C50112 Malignant neoplasm of central portion of left female breast: Secondary | ICD-10-CM

## 2019-09-04 ENCOUNTER — Other Ambulatory Visit: Payer: Self-pay | Admitting: Family Medicine

## 2019-09-04 DIAGNOSIS — F329 Major depressive disorder, single episode, unspecified: Secondary | ICD-10-CM

## 2019-09-04 NOTE — Telephone Encounter (Signed)
[facility] is requesting to fill pt wellbutrin. Please advise. [facility] 

## 2019-09-10 NOTE — Progress Notes (Signed)
Patient Care Team: Denita Lung, MD as PCP - General (Family Medicine)  DIAGNOSIS:    ICD-10-CM   1. Ductal carcinoma in situ (DCIS) of left breast  D05.12     SUMMARY OF ONCOLOGIC HISTORY: Oncology History  Ductal carcinoma in situ (DCIS) of left breast  12/29/2012 Surgery   Left breast lumpectomy: High-grade DCIS with comedo necrosis extending to the margins ER 0% PR 0%   02/09/2013 Surgery   Left breast mastectomy with reconstruction: DCIS high-grade with comedo necrosis 4 cm 2 SLN negative +1 additional lymph nodes negative, ER 0%, PR 0%   02/22/2013 Genetic Testing   VHL c.5C>G (p.Pro2Leu) VUS identified on the Comprehensive Cancer Panel.  The Comprehensive Common Cancer Panel offered by [facility] includes sequencing and/or deletion duplication testing of the following 29 genes: APC, ATM, AXIN2, BARD1, BMPR1A, BRCA1, BRCA2, BRIP1, CDH1, CDK4, CDKN2A, CHEK2, EPCAM, FANCC, MLH1, MSH2, MSH6, MUTYH, NBN, PALB2, PMS2, PTEN, RAD51C, RAD51D, SMAD4, STK11, TP53, and VHL.   The report date is February 22, 2013.  UPDATE: VHL c.5C>G (p.Pro2Leu) has been reclassified from a variant of uncertain significance to a likely benign variant based on a combination of sources, e.g., internal data, published literature, population databases and in silico models.  The updated report date is March 30, 2017.    03/27/2013 -  Anti-estrogen oral therapy   Tamoxifen 20 mg daily     CHIEF COMPLIANT: Follow-up of left breast DCIS   INTERVAL HISTORY: Ariel Henderson is a 74 y.o. with above-mentioned history of left breast DCIS treated with mastectomy and who completed tamoxifen in 2019. She presents to the clinic today for follow-up.  Her left breast implant has a dimple on the bottom as well as it sits very high up on the chest.  She does not have any pain related to it.  ALLERGIES:  is allergic to aspirin.  MEDICATIONS:  Current Outpatient Medications  Medication Sig Dispense Refill  . benzonatate  (TESSALON) 100 MG capsule Take 1 capsule (100 mg total) by mouth every 8 (eight) hours. (Patient not taking: Reported on 04/01/2018) 21 capsule 0  . buPROPion (WELLBUTRIN XL) 150 MG 24 hr tablet TAKE 1 TABLET BY MOUTH  DAILY 90 tablet 3  . Calcium Carbonate-Vit D-Min (CALCIUM 1200 PO) Take 1,000 mg by mouth daily.     . citalopram (CELEXA) 40 MG tablet TAKE 1 TABLET BY MOUTH  DAILY 90 tablet 1  . fluticasone (FLONASE) 50 MCG/ACT nasal spray Place 2 sprays into both nostrils daily. (Patient not taking: Reported on 04/01/2018) 1 g 0  . levothyroxine (SYNTHROID) 88 MCG tablet Take 1 tablet (88 mcg total) by mouth daily. 90 tablet 1  . lovastatin (MEVACOR) 40 MG tablet Take 1 tablet (40 mg total) by mouth at bedtime. 90 tablet 1  . nitroGLYCERIN (NITROSTAT) 0.4 MG SL tablet Place 1 tablet (0.4 mg total) under the tongue every 5 (five) minutes as needed for chest pain. 25 tablet 6  . omeprazole (PRILOSEC) 40 MG capsule TAKE 1 CAPSULE BY MOUTH  DAILY 90 capsule 1  . propranolol (INDERAL) 40 MG tablet TAKE 1 TABLET BY MOUTH  DAILY 90 tablet 1  . propranolol (INDERAL) 40 MG tablet TAKE 1 TABLET BY MOUTH  DAILY 90 tablet 1  . ranitidine (ZANTAC) 150 MG tablet Take 150 mg by mouth 2 (two) times daily.    . SUMAtriptan (IMITREX) 100 MG tablet Take 100 mg by mouth every 2 (two) hours as needed for migraine. May repeat  in 2 hours if headache persists or recurs.    . tamoxifen (NOLVADEX) 20 MG tablet TAKE 1 TABLET BY MOUTH  DAILY 90 tablet 3   No current facility-administered medications for this visit.    PHYSICAL EXAMINATION: ECOG PERFORMANCE STATUS: 1 - Symptomatic but completely ambulatory  Vitals:   09/11/19 1143  BP: 136/63  Pulse: 76  Resp: 17  Temp: 98.3 F (36.8 C)  SpO2: 98%   Filed Weights   09/11/19 1143  Weight: 175 lb 14.4 oz (79.8 kg)     BREAST: No palpable masses or nodules in either right or left breasts.  Left breast implant no palpable lumps or nodules.  No palpable axillary  supraclavicular or infraclavicular adenopathy no breast tenderness or nipple discharge. (exam performed in the presence of a chaperone)  LABORATORY DATA:  I have reviewed the data as listed CMP Latest Ref Rng & Units 08/15/2019 09/08/2018 09/10/2017  Glucose 65 - 99 mg/dL 93 112(H) 106  BUN 8 - 27 mg/dL 12 11 17.1  Creatinine 0.57 - 1.00 mg/dL 0.73 0.78 0.7  Sodium 134 - 144 mmol/L 141 141 138  Potassium 3.5 - 5.2 mmol/L 4.6 4.1 4.4  Chloride 96 - 106 mmol/L 104 106 -  CO2 20 - 29 mmol/L 24 26 23   Calcium 8.7 - 10.3 mg/dL 8.8 9.0 9.2  Total Protein 6.0 - 8.5 g/dL 6.4 6.7 6.6  Total Bilirubin 0.0 - 1.2 mg/dL 0.2 0.3 <0.22  Alkaline Phos 39 - 117 IU/L 54 50 51  AST 0 - 40 IU/L 15 20 20   ALT 0 - 32 IU/L 13 18 22     Lab Results  Component Value Date   WBC 7.8 09/11/2019   HGB 11.8 (L) 09/11/2019   HCT 37.1 09/11/2019   MCV 94.6 09/11/2019   PLT 230 09/11/2019   NEUTROABS 4.4 09/11/2019    ASSESSMENT & PLAN:  Ductal carcinoma in situ (DCIS) of left breast DCIS left breast diagnosed in 2014 status post mastectomy after the initial lumpectomy had positive margins. She underwent breast reconstruction. Tamoxifen July 2014-July 2019  Breast Cancer Surveillance: 1. Breast exam1/12/2019: Normal , tenderness in the left axilla. No palpable nodules or lumps. 2. Rt Mammogram8/15/2019: No abnormalities. Postsurgical changes. Breast Density CategoryB.  RTC in 1 year with labs.In spite of my discussions, patient prefers to have blood work done every year.  No orders of the defined types were placed in this encounter.  The patient has a good understanding of the overall plan. she agrees with it. she will call with any problems that may develop before the next visit here.  Nicholas Lose, MD 09/11/2019  Julious Oka Dorshimer, am acting as scribe for Dr. Nicholas Lose.  I have reviewed the above document for accuracy and completeness, and I agree with the above.

## 2019-09-11 ENCOUNTER — Inpatient Hospital Stay: Payer: Medicare Other | Admitting: Hematology and Oncology

## 2019-09-11 ENCOUNTER — Inpatient Hospital Stay: Payer: Medicare Other | Attending: Hematology and Oncology

## 2019-09-11 ENCOUNTER — Other Ambulatory Visit: Payer: Self-pay

## 2019-09-11 ENCOUNTER — Telehealth: Payer: Self-pay | Admitting: Hematology and Oncology

## 2019-09-11 ENCOUNTER — Other Ambulatory Visit: Payer: Self-pay | Admitting: *Deleted

## 2019-09-11 DIAGNOSIS — D0512 Intraductal carcinoma in situ of left breast: Secondary | ICD-10-CM | POA: Diagnosis not present

## 2019-09-11 DIAGNOSIS — Z9223 Personal history of estrogen therapy: Secondary | ICD-10-CM | POA: Diagnosis not present

## 2019-09-11 DIAGNOSIS — Z853 Personal history of malignant neoplasm of breast: Secondary | ICD-10-CM | POA: Insufficient documentation

## 2019-09-11 DIAGNOSIS — Z9012 Acquired absence of left breast and nipple: Secondary | ICD-10-CM | POA: Insufficient documentation

## 2019-09-11 DIAGNOSIS — Z170 Estrogen receptor positive status [ER+]: Secondary | ICD-10-CM | POA: Insufficient documentation

## 2019-09-11 LAB — CMP (CANCER CENTER ONLY)
ALT: 16 U/L (ref 0–44)
AST: 18 U/L (ref 15–41)
Albumin: 3.7 g/dL (ref 3.5–5.0)
Alkaline Phosphatase: 41 U/L (ref 38–126)
Anion gap: 11 (ref 5–15)
BUN: 14 mg/dL (ref 8–23)
CO2: 24 mmol/L (ref 22–32)
Calcium: 8.7 mg/dL — ABNORMAL LOW (ref 8.9–10.3)
Chloride: 104 mmol/L (ref 98–111)
Creatinine: 0.69 mg/dL (ref 0.44–1.00)
GFR, Est AFR Am: >60 mL/min (ref >60)
GFR, Estimated: >60 mL/min (ref >60)
Glucose, Bld: 112 mg/dL — ABNORMAL HIGH (ref 70–99)
Potassium: 3.8 mmol/L (ref 3.5–5.1)
Sodium: 139 mmol/L (ref 135–145)
Total Bilirubin: 0.7 mg/dL (ref 0.3–1.2)
Total Protein: 6.5 g/dL (ref 6.5–8.1)

## 2019-09-11 LAB — CBC WITH DIFFERENTIAL (CANCER CENTER ONLY)
Abs Immature Granulocytes: 0.07 10*3/uL (ref 0.00–0.07)
Basophils Absolute: 0.1 10*3/uL (ref 0.0–0.1)
Basophils Relative: 1 %
Eosinophils Absolute: 0.2 10*3/uL (ref 0.0–0.5)
Eosinophils Relative: 2 %
HCT: 37.1 % (ref 36.0–46.0)
Hemoglobin: 11.8 g/dL — ABNORMAL LOW (ref 12.0–15.0)
Immature Granulocytes: 1 %
Lymphocytes Relative: 32 %
Lymphs Abs: 2.5 10*3/uL (ref 0.7–4.0)
MCH: 30.1 pg (ref 26.0–34.0)
MCHC: 31.8 g/dL (ref 30.0–36.0)
MCV: 94.6 fL (ref 80.0–100.0)
Monocytes Absolute: 0.6 10*3/uL (ref 0.1–1.0)
Monocytes Relative: 7 %
Neutro Abs: 4.4 10*3/uL (ref 1.7–7.7)
Neutrophils Relative %: 57 %
Platelet Count: 230 10*3/uL (ref 150–400)
RBC: 3.92 MIL/uL (ref 3.87–5.11)
RDW: 13.7 % (ref 11.5–15.5)
WBC Count: 7.8 10*3/uL (ref 4.0–10.5)
nRBC: 0 % (ref 0.0–0.2)

## 2019-09-11 NOTE — Assessment & Plan Note (Signed)
DCIS left breast diagnosed in 2014 status post mastectomy after the initial lumpectomy had positive margins. She underwent breast reconstruction. Tamoxifen July 2014-July 2019  Breast Cancer Surveillance: 1. Breast exam1/12/2019: Normal , tenderness in the left axilla. No palpable nodules or lumps. 2. Rt Mammogram8/15/2019: No abnormalities. Postsurgical changes. Breast Density CategoryB.  RTC in 1 year with labs.In spite of my discussions, patient prefers to have blood work done every year.

## 2019-09-11 NOTE — Telephone Encounter (Signed)
I talk with patient regarding schedule  

## 2019-12-03 ENCOUNTER — Other Ambulatory Visit: Payer: Self-pay | Admitting: Family Medicine

## 2019-12-14 ENCOUNTER — Ambulatory Visit: Payer: Medicare Other | Admitting: Family Medicine

## 2019-12-18 ENCOUNTER — Encounter: Payer: Self-pay | Admitting: Family Medicine

## 2020-05-28 ENCOUNTER — Ambulatory Visit (INDEPENDENT_AMBULATORY_CARE_PROVIDER_SITE_OTHER): Payer: Medicare Other | Admitting: Family Medicine

## 2020-05-28 ENCOUNTER — Other Ambulatory Visit: Payer: Self-pay

## 2020-05-28 VITALS — BP 116/74 | HR 61 | Temp 98.5°F | Ht 63.0 in | Wt 171.6 lb

## 2020-05-28 DIAGNOSIS — M1990 Unspecified osteoarthritis, unspecified site: Secondary | ICD-10-CM

## 2020-05-28 DIAGNOSIS — J309 Allergic rhinitis, unspecified: Secondary | ICD-10-CM

## 2020-05-28 DIAGNOSIS — E2839 Other primary ovarian failure: Secondary | ICD-10-CM

## 2020-05-28 DIAGNOSIS — Z0000 Encounter for general adult medical examination without abnormal findings: Secondary | ICD-10-CM | POA: Diagnosis not present

## 2020-05-28 DIAGNOSIS — Z23 Encounter for immunization: Secondary | ICD-10-CM

## 2020-05-28 DIAGNOSIS — Z1159 Encounter for screening for other viral diseases: Secondary | ICD-10-CM | POA: Diagnosis not present

## 2020-05-28 DIAGNOSIS — D0512 Intraductal carcinoma in situ of left breast: Secondary | ICD-10-CM

## 2020-05-28 DIAGNOSIS — Z85850 Personal history of malignant neoplasm of thyroid: Secondary | ICD-10-CM

## 2020-05-28 DIAGNOSIS — Z800 Family history of malignant neoplasm of digestive organs: Secondary | ICD-10-CM

## 2020-05-28 DIAGNOSIS — K219 Gastro-esophageal reflux disease without esophagitis: Secondary | ICD-10-CM

## 2020-05-28 DIAGNOSIS — I10 Essential (primary) hypertension: Secondary | ICD-10-CM

## 2020-05-28 DIAGNOSIS — C439 Malignant melanoma of skin, unspecified: Secondary | ICD-10-CM | POA: Diagnosis not present

## 2020-05-28 DIAGNOSIS — E785 Hyperlipidemia, unspecified: Secondary | ICD-10-CM | POA: Diagnosis not present

## 2020-05-28 DIAGNOSIS — Z7189 Other specified counseling: Secondary | ICD-10-CM

## 2020-05-28 DIAGNOSIS — F320 Major depressive disorder, single episode, mild: Secondary | ICD-10-CM

## 2020-05-28 NOTE — Progress Notes (Signed)
Ariel Henderson is a 74 y.o. female who presents for annual wellness visit,CPE and follow-up on chronic medical conditions.  She states that recently her daughter died from Covid.  This does have her distraught and she is interested in potentially getting vaccinated.  Presently she is taking Wellbutrin as well as Celexa and seems to be doing fairly well under the circumstances.  She does have a history of breast cancer and is presently still on tamoxifen.  Apparently she is about to finish taking this.  She does have reflux disease but usually has difficulty with certain foods specifically [place] and [place].  She knows to take it when it occurs.  She continues on lovastatin for her lipids and is having no difficulty with that.  She does have a history of thyroid cancer and presently is on Synthroid.  Her allergies seem to be under good control with the use of Flonase.  She does have a history of malignant melanoma but has not followed up with dermatology in quite some time.  She also has a family history of colon cancer and cannot remember when she had her last colonoscopy.  She does not exercise regularly and does complain of some arthritic type symptoms.  Presently she is on no medication.  She continues on Inderal for her blood pressure.  Family and social history was otherwise negative.  Immunizations and Health Maintenance Immunization History  Administered Date(s) Administered  . Fluad Quad(high Dose 65+) 08/15/2019  . Influenza Split 04/17/2013, 06/28/2013, 05/11/2014  . Influenza, High Dose Seasonal PF 05/03/2017, 05/31/2018  . Influenza-Unspecified 07/23/2010, 05/22/2011, 05/13/2012  . Pneumococcal Conjugate-13 09/28/2013, 01/19/2015  . Pneumococcal Polysaccharide-23 10/23/2010  . Td 02/13/2002, 04/27/2012  . Zoster 10/28/2011  . Zoster Recombinat (Shingrix) 12/17/2016, 02/26/2017   Health Maintenance Due  Topic Date Due  . Hepatitis C Screening  Never done  . COVID-19 Vaccine (1) Never  done  . COLONOSCOPY  Never done  . DEXA SCAN  Never done  . INFLUENZA VACCINE  04/07/2020    Last Pap smear: aged out  Last mammogram:04/21/18 Last colonoscopy: 02/28/1995 Last DEXA: N/A Dentist: Q Three months Ophtho: over two years  Exercise: not at this time   Other doctors caring for patient include:Dr. [facility] oncology,  Dr. [place]  cardiology  Advanced directives: Does Patient Have a Medical Advance Directive?: Yes Type of Advance Directive: Living will  Depression screen:  See questionnaire below.  Depression screen [facility] 2/9 05/28/2020 01/05/2019 09/19/2018 08/08/2018 04/01/2018  Decreased Interest 0 0 0 2 2  Down, Depressed, Hopeless 1 1 1 1 1   PHQ - 2 Score 1 1 1 3 3   Altered sleeping - - 3 2 3   Tired, decreased energy - - 0 0 2  Change in appetite - - 0 0 2  Feeling bad or failure about yourself  - - 0 0 0  Trouble concentrating - - 1 3 0  Moving slowly or fidgety/restless - - 0 0 0  Suicidal thoughts - - 0 0 0  PHQ-9 Score - - 5 8 10   Difficult doing work/chores - - Somewhat difficult Somewhat difficult Somewhat difficult    Fall Risk Screen: see questionnaire below. Fall Risk  05/28/2020 01/05/2019 06/23/2017 03/16/2017 08/14/2014  Falls in the past year? 0 0 No No No    ADL screen:  See questionnaire below Functional Status Survey: Is the patient deaf or have difficulty hearing?: No Does the patient have difficulty seeing, even when wearing glasses/contacts?: No Does the patient  have difficulty concentrating, remembering, or making decisions?: No Does the patient have difficulty walking or climbing stairs?: Yes (knee pain) Does the patient have difficulty dressing or bathing?: No Does the patient have difficulty doing errands alone such as visiting a doctor's office or shopping?: No   Review of Systems Constitutional: -, -unexpected weight change, -anorexia, -fatigue Allergy: -sneezing, -itching, -congestion Dermatology: denies changing moles, rash, lumps ENT:  -runny nose, -ear pain, -sore throat,  Cardiology:  -chest pain, -palpitations, -orthopnea, Respiratory: -cough, -shortness of breath, -dyspnea on exertion, -wheezing,  Gastroenterology: -abdominal pain, -nausea, -vomiting, -diarrhea, -constipation, -dysphagia Hematology: -bleeding or bruising problems Musculoskeletal: -arthralgias, -myalgias, -joint swelling, -back pain, - Ophthalmology: -vision changes,  Urology: -dysuria, -difficulty urinating,  -urinary frequency, -urgency, incontinence Neurology: -, -numbness, , -memory loss, -falls, -dizziness    PHYSICAL EXAM: General Appearance: Alert, cooperative, no distress, appears stated age Head: Normocephalic, without obvious abnormality, atraumatic Eyes: PERRL, conjunctiva/corneas clear, EOM's intact,  Ears: Normal TM's and external ear canals Nose: Nares normal, mucosa normal, no drainage or sinus tenderness Throat: Lips, mucosa, and tongue normal; teeth and gums normal Neck: Supple, no lymphadenopathy;  thyroid:  no enlargement/tenderness/nodules; no carotid bruit or JVD Lungs: Clear to auscultation bilaterally without wheezes, rales or ronchi; respirations unlabored Heart: Regular rate and rhythm, S1 and S2 normal, no murmur, rubor gallop Abdomen: Soft, non-tender, nondistended, normoactive bowel sounds,  no masses, no hepatosplenomegaly Extremities: No clubbing, cyanosis or edema Pulses: 2+ and symmetric all extremities Skin:  Skin color, texture, turgor normal, no rashes or lesions Lymph nodes: Cervical, supraclavicular, and axillary nodes normal Neurologic:  CNII-XII intact, normal strength, sensation and gait; reflexes 2+ and symmetric throughout Psych: Normal mood, affect, hygiene and grooming.  ASSESSMENT/PLAN: Routine general medical examination at a health care facility - Plan: CBC with Differential/Platelet, Comprehensive metabolic panel, Lipid panel  Immunization, viral disease - Plan: Pfizer SARS-COV-2 Vaccine  Need  for influenza vaccination - Plan: Flu Vaccine QUAD High Dose(Fluad)  Family history of malignant neoplasm of gastrointestinal tract  Depression, major, single episode, mild (HCC)  Melanoma of skin ([place])  History of thyroid cancer - Plan: TSH  Ductal carcinoma in situ (DCIS) of left breast  Hyperlipidemia, unspecified hyperlipidemia type - Plan: Lipid panel  Essential hypertension - Plan: CBC with Differential/Platelet, Comprehensive metabolic panel  Arthritis  Allergic rhinitis, unspecified seasonality, unspecified trigger  Bereavement counseling  Estrogen deficiency - Plan: DG Bone Density  Encounter for hepatitis C screening test for low risk patient - Plan: Hepatitis C antibody We will follow-up concerning when her next colonoscopy is necessary.  Also encouraged her to follow-up with dermatology concerning that melanoma.  Continue on her present medications.  Discussed bereavement counseling with her.  Encouraged her to explore all the emotions she is having and not hide any of them.  Recommend she use the Prilosec on an as-needed basis.  Continue to treat allergies as needed.  Continue on her other medications as she seems be doing fairly well on them. Pfizer vaccine given.  Discussed in detail the benefits and risks.  She will return here in 3 weeks for repeat.    Discussed ; healthy diet, including goals of calcium and vitamin D intake Immunization recommendations discussed.  Colonoscopy recommendations reviewed   Medicare Attestation I have personally reviewed: The patient's medical and social history Their use of alcohol, tobacco or illicit drugs Their current medications and supplements The patient's functional ability including ADLs,fall risks, home safety risks, cognitive, and hearing and visual impairment Diet and physical  activities Evidence for depression or mood disorders  The patient's weight, height, and BMI have been recorded in the chart.  I have made  referrals, counseling, and provided education to the patient based on review of the above and I have provided the patient with a written personalized care plan for preventive services.     Jill Alexanders, MD   05/28/2020

## 2020-05-29 LAB — CBC WITH DIFFERENTIAL/PLATELET
Basophils Absolute: 0.1 10*3/uL (ref 0.0–0.2)
Basos: 1 %
EOS (ABSOLUTE): 0.2 10*3/uL (ref 0.0–0.4)
Eos: 2 %
Hematocrit: 37.4 % (ref 34.0–46.6)
Hemoglobin: 12.2 g/dL (ref 11.1–15.9)
Immature Grans (Abs): 0.1 10*3/uL (ref 0.0–0.1)
Immature Granulocytes: 1 %
Lymphocytes Absolute: 3.4 10*3/uL — ABNORMAL HIGH (ref 0.7–3.1)
Lymphs: 32 %
MCH: 29.7 pg (ref 26.6–33.0)
MCHC: 32.6 g/dL (ref 31.5–35.7)
MCV: 91 fL (ref 79–97)
Monocytes Absolute: 0.8 10*3/uL (ref 0.1–0.9)
Monocytes: 7 %
Neutrophils Absolute: 6.2 10*3/uL (ref 1.4–7.0)
Neutrophils: 57 %
Platelets: 381 10*3/uL (ref 150–450)
RBC: 4.11 x10E6/uL (ref 3.77–5.28)
RDW: 12.7 % (ref 11.7–15.4)
WBC: 10.9 10*3/uL — ABNORMAL HIGH (ref 3.4–10.8)

## 2020-05-29 LAB — COMPREHENSIVE METABOLIC PANEL
ALT: 28 IU/L (ref 0–32)
AST: 28 IU/L (ref 0–40)
Albumin/Globulin Ratio: 1.6 (ref 1.2–2.2)
Albumin: 4.2 g/dL (ref 3.7–4.7)
Alkaline Phosphatase: 80 IU/L (ref 44–121)
BUN/Creatinine Ratio: 18 (ref 12–28)
BUN: 13 mg/dL (ref 8–27)
Bilirubin Total: 0.3 mg/dL (ref 0.0–1.2)
CO2: 24 mmol/L (ref 20–29)
Calcium: 9.1 mg/dL (ref 8.7–10.3)
Chloride: 104 mmol/L (ref 96–106)
Creatinine, Ser: 0.73 mg/dL (ref 0.57–1.00)
GFR calc Af Amer: 93 mL/min/{1.73_m2} (ref >59)
GFR calc non Af Amer: 81 mL/min/{1.73_m2} (ref >59)
Globulin, Total: 2.7 g/dL (ref 1.5–4.5)
Glucose: 108 mg/dL — ABNORMAL HIGH (ref 65–99)
Potassium: 4.7 mmol/L (ref 3.5–5.2)
Sodium: 139 mmol/L (ref 134–144)
Total Protein: 6.9 g/dL (ref 6.0–8.5)

## 2020-05-29 LAB — TSH: TSH: 12 u[IU]/mL — ABNORMAL HIGH (ref 0.450–4.500)

## 2020-05-29 LAB — HEPATITIS C ANTIBODY: Hep C Virus Ab: <0.1 s/co ratio (ref 0.0–0.9)

## 2020-05-29 LAB — LIPID PANEL
Chol/HDL Ratio: 4.1 ratio (ref 0.0–4.4)
Cholesterol, Total: 228 mg/dL — ABNORMAL HIGH (ref 100–199)
HDL: 55 mg/dL (ref >39)
LDL Chol Calc (NIH): 145 mg/dL — ABNORMAL HIGH (ref 0–99)
Triglycerides: 158 mg/dL — ABNORMAL HIGH (ref 0–149)
VLDL Cholesterol Cal: 28 mg/dL (ref 5–40)

## 2020-06-20 ENCOUNTER — Ambulatory Visit: Payer: Medicare Other

## 2020-06-20 ENCOUNTER — Other Ambulatory Visit: Payer: Self-pay

## 2020-07-11 ENCOUNTER — Other Ambulatory Visit: Payer: Self-pay

## 2020-07-11 ENCOUNTER — Telehealth: Payer: Self-pay

## 2020-07-11 DIAGNOSIS — F329 Major depressive disorder, single episode, unspecified: Secondary | ICD-10-CM

## 2020-07-11 MED ORDER — LOVASTATIN 40 MG PO TABS: 40.0000 mg | ORAL_TABLET | Freq: Every day | ORAL | 3 refills | Status: DC

## 2020-07-11 MED ORDER — OMEPRAZOLE 40 MG PO CPDR
40.0000 mg | DELAYED_RELEASE_CAPSULE | Freq: Every day | ORAL | 1 refills | Status: DC
Start: 2020-07-11 — End: 2021-06-06

## 2020-07-11 MED ORDER — LEVOTHYROXINE SODIUM 88 MCG PO TABS: 88.0000 ug | ORAL_TABLET | Freq: Every day | ORAL | 3 refills | Status: DC

## 2020-07-11 MED ORDER — SUMATRIPTAN SUCCINATE 100 MG PO TABS: 100.0000 mg | ORAL_TABLET | ORAL | 5 refills | Status: DC | PRN

## 2020-07-11 MED ORDER — CITALOPRAM HYDROBROMIDE 40 MG PO TABS: 40.0000 mg | ORAL_TABLET | Freq: Every day | ORAL | 1 refills | Status: DC

## 2020-07-11 MED ORDER — BUPROPION HCL ER (XL) 150 MG PO TB24: 150.0000 mg | ORAL_TABLET | Freq: Every day | ORAL | 3 refills | Status: DC

## 2020-07-11 NOTE — Telephone Encounter (Signed)
Pt is requesting to have wellbutrin, celxa and sumatriptan. Please advise [facility]

## 2020-07-11 NOTE — Telephone Encounter (Signed)
Pt states has been waiting since her appt for her refills from [facility] on all her meds and says [facility] still hasn't gotten them.  Can you please refill all her meds she only has a week left.

## 2020-08-28 DIAGNOSIS — R001 Bradycardia, unspecified: Secondary | ICD-10-CM | POA: Diagnosis not present

## 2020-08-28 DIAGNOSIS — R110 Nausea: Secondary | ICD-10-CM | POA: Diagnosis not present

## 2020-08-28 DIAGNOSIS — R079 Chest pain, unspecified: Secondary | ICD-10-CM | POA: Diagnosis not present

## 2020-08-28 DIAGNOSIS — R0902 Hypoxemia: Secondary | ICD-10-CM | POA: Diagnosis not present

## 2020-08-28 DIAGNOSIS — R1111 Vomiting without nausea: Secondary | ICD-10-CM | POA: Diagnosis not present

## 2020-09-10 ENCOUNTER — Inpatient Hospital Stay: Payer: Medicare Other | Admitting: Hematology and Oncology

## 2020-09-19 NOTE — Progress Notes (Signed)
Patient Care Team: Denita Lung, MD as PCP - General (Family Medicine)  DIAGNOSIS:    ICD-10-CM   1. Ductal carcinoma in situ (DCIS) of left breast  D05.12     SUMMARY OF ONCOLOGIC HISTORY: Oncology History  Ductal carcinoma in situ (DCIS) of left breast  12/29/2012 Surgery   Left breast lumpectomy: High-grade DCIS with comedo necrosis extending to the margins ER 0% PR 0%   02/09/2013 Surgery   Left breast mastectomy with reconstruction: DCIS high-grade with comedo necrosis 4 cm 2 SLN negative +1 additional lymph nodes negative, ER 0%, PR 0%   02/22/2013 Genetic Testing   VHL c.5C>G (p.Pro2Leu) VUS identified on the Comprehensive Cancer Panel.  The Comprehensive Common Cancer Panel offered by [facility] includes sequencing and/or deletion duplication testing of the following 29 genes: APC, ATM, AXIN2, BARD1, BMPR1A, BRCA1, BRCA2, BRIP1, CDH1, CDK4, CDKN2A, CHEK2, EPCAM, FANCC, MLH1, MSH2, MSH6, MUTYH, NBN, PALB2, PMS2, PTEN, RAD51C, RAD51D, SMAD4, STK11, TP53, and VHL.   The report date is February 22, 2013.  UPDATE: VHL c.5C>G (p.Pro2Leu) has been reclassified from a variant of uncertain significance to a likely benign variant based on a combination of sources, e.g., internal data, published literature, population databases and in silico models.  The updated report date is March 30, 2017.    03/27/2013 -  Anti-estrogen oral therapy   Tamoxifen 20 mg daily     CHIEF COMPLIANT: Follow-up of left breast DCIS   INTERVAL HISTORY: Ariel Henderson is a 75 y.o. with above-mentioned history of left breast DCIS treated with mastectomy and who completed tamoxifen in 2019.She presents to the clinic today for follow-up.   Her daughter passed away from COVID-19 earlier in the year.  She is very devastated about this.  Her husband passed away previously.  She denies any lumps or nodules in the breast.  She does have mild discomfort underneath the left breast.  ALLERGIES:  is allergic to  aspirin.  MEDICATIONS:  Current Outpatient Medications  Medication Sig Dispense Refill   benzonatate (TESSALON) 100 MG capsule Take 1 capsule (100 mg total) by mouth every 8 (eight) hours. (Patient not taking: Reported on 04/01/2018) 21 capsule 0   buPROPion (WELLBUTRIN XL) 150 MG 24 hr tablet Take 1 tablet (150 mg total) by mouth daily. 90 tablet 3   Calcium Carbonate-Vit D-Min (CALCIUM 1200 PO) Take 1,000 mg by mouth daily.      citalopram (CELEXA) 40 MG tablet Take 1 tablet (40 mg total) by mouth daily. 90 tablet 1   fluticasone (FLONASE) 50 MCG/ACT nasal spray Place 2 sprays into both nostrils daily. (Patient not taking: Reported on 04/01/2018) 1 g 0   levothyroxine (SYNTHROID) 88 MCG tablet Take 1 tablet (88 mcg total) by mouth daily. 90 tablet 3   lovastatin (MEVACOR) 40 MG tablet Take 1 tablet (40 mg total) by mouth at bedtime. 90 tablet 3   nitroGLYCERIN (NITROSTAT) 0.4 MG SL tablet Place 1 tablet (0.4 mg total) under the tongue every 5 (five) minutes as needed for chest pain. 25 tablet 6   omeprazole (PRILOSEC) 40 MG capsule Take 1 capsule (40 mg total) by mouth daily. 90 capsule 1   propranolol (INDERAL) 40 MG tablet TAKE 1 TABLET BY MOUTH  DAILY 90 tablet 1   ranitidine (ZANTAC) 150 MG tablet Take 150 mg by mouth 2 (two) times daily. (Patient not taking: Reported on 05/28/2020)     SUMAtriptan (IMITREX) 100 MG tablet Take 1 tablet (100 mg total) by mouth  every 2 (two) hours as needed for migraine. May repeat in 2 hours if headache persists or recurs. 10 tablet 5   tamoxifen (NOLVADEX) 20 MG tablet TAKE 1 TABLET BY MOUTH  DAILY (Patient not taking: Reported on 05/28/2020) 90 tablet 3   No current facility-administered medications for this visit.    PHYSICAL EXAMINATION: ECOG PERFORMANCE STATUS: 1 - Symptomatic but completely ambulatory  Vitals:   09/20/20 1111  BP: (!) 152/81  Pulse: 76  Resp: 16  Temp: (!) 97.4 F (36.3 C)  SpO2: 96%   Filed Weights   09/20/20  1111  Weight: 173 lb 8 oz (78.7 kg)    BREAST: No palpable masses or nodules in either right or left breasts. No palpable axillary supraclavicular or infraclavicular adenopathy no breast tenderness or nipple discharge. (exam performed in the presence of a chaperone)  LABORATORY DATA:  I have reviewed the data as listed CMP Latest Ref Rng & Units 05/28/2020 09/11/2019 08/15/2019  Glucose 65 - 99 mg/dL 108(H) 112(H) 93  BUN 8 - 27 mg/dL 13 14 12   Creatinine 0.57 - 1.00 mg/dL 0.73 0.69 0.73  Sodium 134 - 144 mmol/L 139 139 141  Potassium 3.5 - 5.2 mmol/L 4.7 3.8 4.6  Chloride 96 - 106 mmol/L 104 104 104  CO2 20 - 29 mmol/L 24 24 24   Calcium 8.7 - 10.3 mg/dL 9.1 8.7(L) 8.8  Total Protein 6.0 - 8.5 g/dL 6.9 6.5 6.4  Total Bilirubin 0.0 - 1.2 mg/dL 0.3 0.7 0.2  Alkaline Phos 44 - 121 IU/L 80 41 54  AST 0 - 40 IU/L 28 18 15   ALT 0 - 32 IU/L 28 16 13     Lab Results  Component Value Date   WBC 10.9 (H) 05/28/2020   HGB 12.2 05/28/2020   HCT 37.4 05/28/2020   MCV 91 05/28/2020   PLT 381 05/28/2020   NEUTROABS 6.2 05/28/2020    ASSESSMENT & PLAN:  Ductal carcinoma in situ (DCIS) of left breast DCIS left breast diagnosed in 2014 status post mastectomy after the initial lumpectomy had positive margins. She underwent breast reconstruction.Tamoxifen July 2014-July 2019  Patient's daughter passed away from COVID-19 (she had kidney transplant previously)  Breast Cancer Surveillance: 1. Breast exam1/12/2019: Normal , tenderness in the left axilla. No palpable nodules or lumps. 2. Rt Mammogram8/15/2019: No abnormalities. Postsurgical changes. Breast Density CategoryB.  She has not had any mammograms over the last 2 years.  She wants to wait till April to get a mammogram. I sent a new order for the mammogram today.  RTC in 1 year with labs.     No orders of the defined types were placed in this encounter.  The patient has a good understanding of the overall plan. she agrees with it.  she will call with any problems that may develop before the next visit here.  Total time spent: 20 mins including face to face time and time spent for planning, charting and coordination of care  Nicholas Lose, MD 09/20/2020  I, Cloyde Reams Dorshimer, am acting as scribe for Dr. Nicholas Lose.  I have reviewed the above documentation for accuracy and completeness, and I agree with the above.

## 2020-09-20 ENCOUNTER — Other Ambulatory Visit: Payer: Self-pay

## 2020-09-20 ENCOUNTER — Inpatient Hospital Stay: Payer: Medicare Other | Attending: Hematology and Oncology | Admitting: Hematology and Oncology

## 2020-09-20 DIAGNOSIS — D0512 Intraductal carcinoma in situ of left breast: Secondary | ICD-10-CM | POA: Diagnosis not present

## 2020-09-20 DIAGNOSIS — Z853 Personal history of malignant neoplasm of breast: Secondary | ICD-10-CM | POA: Diagnosis not present

## 2020-09-20 DIAGNOSIS — Z9012 Acquired absence of left breast and nipple: Secondary | ICD-10-CM | POA: Insufficient documentation

## 2020-09-20 DIAGNOSIS — Z170 Estrogen receptor positive status [ER+]: Secondary | ICD-10-CM | POA: Insufficient documentation

## 2020-09-20 DIAGNOSIS — Z9223 Personal history of estrogen therapy: Secondary | ICD-10-CM | POA: Diagnosis not present

## 2020-09-20 NOTE — Assessment & Plan Note (Signed)
DCIS left breast diagnosed in 2014 status post mastectomy after the initial lumpectomy had positive margins. She underwent breast reconstruction. Tamoxifen July 2014-July 2019  Breast Cancer Surveillance: 1. Breast exam1/12/2019: Normal , tenderness in the left axilla. No palpable nodules or lumps. 2. Rt Mammogram8/15/2019: No abnormalities. Postsurgical changes. Breast Density CategoryB.  RTC in 1 year with labs.In spite of my discussions, patient prefers to have blood work done every year. 

## 2020-09-27 ENCOUNTER — Emergency Department (HOSPITAL_COMMUNITY): Payer: Medicare Other

## 2020-09-27 ENCOUNTER — Encounter (HOSPITAL_COMMUNITY): Payer: Self-pay | Admitting: Emergency Medicine

## 2020-09-27 ENCOUNTER — Inpatient Hospital Stay (HOSPITAL_COMMUNITY)
Admission: EM | Admit: 2020-09-27 | Discharge: 2020-10-01 | DRG: 419 | Disposition: A | Discharge status: Home / Self Care | Payer: Medicare Other | Attending: Family Medicine | Admitting: Family Medicine

## 2020-09-27 ENCOUNTER — Other Ambulatory Visit: Payer: Self-pay

## 2020-09-27 DIAGNOSIS — Z87440 Personal history of urinary (tract) infections: Secondary | ICD-10-CM | POA: Diagnosis not present

## 2020-09-27 DIAGNOSIS — Z808 Family history of malignant neoplasm of other organs or systems: Secondary | ICD-10-CM

## 2020-09-27 DIAGNOSIS — R7401 Elevation of levels of liver transaminase levels: Secondary | ICD-10-CM | POA: Diagnosis not present

## 2020-09-27 DIAGNOSIS — R112 Nausea with vomiting, unspecified: Secondary | ICD-10-CM | POA: Diagnosis not present

## 2020-09-27 DIAGNOSIS — Z8042 Family history of malignant neoplasm of prostate: Secondary | ICD-10-CM | POA: Diagnosis not present

## 2020-09-27 DIAGNOSIS — E042 Nontoxic multinodular goiter: Secondary | ICD-10-CM | POA: Diagnosis present

## 2020-09-27 DIAGNOSIS — I499 Cardiac arrhythmia, unspecified: Secondary | ICD-10-CM | POA: Diagnosis not present

## 2020-09-27 DIAGNOSIS — R918 Other nonspecific abnormal finding of lung field: Secondary | ICD-10-CM | POA: Diagnosis not present

## 2020-09-27 DIAGNOSIS — Z85820 Personal history of malignant melanoma of skin: Secondary | ICD-10-CM

## 2020-09-27 DIAGNOSIS — R6889 Other general symptoms and signs: Secondary | ICD-10-CM | POA: Diagnosis not present

## 2020-09-27 DIAGNOSIS — J984 Other disorders of lung: Secondary | ICD-10-CM | POA: Diagnosis not present

## 2020-09-27 DIAGNOSIS — I10 Essential (primary) hypertension: Secondary | ICD-10-CM

## 2020-09-27 DIAGNOSIS — Z8041 Family history of malignant neoplasm of ovary: Secondary | ICD-10-CM | POA: Diagnosis not present

## 2020-09-27 DIAGNOSIS — R1110 Vomiting, unspecified: Secondary | ICD-10-CM | POA: Diagnosis not present

## 2020-09-27 DIAGNOSIS — K8000 Calculus of gallbladder with acute cholecystitis without obstruction: Secondary | ICD-10-CM

## 2020-09-27 DIAGNOSIS — Z800 Family history of malignant neoplasm of digestive organs: Secondary | ICD-10-CM | POA: Diagnosis not present

## 2020-09-27 DIAGNOSIS — R109 Unspecified abdominal pain: Secondary | ICD-10-CM | POA: Diagnosis not present

## 2020-09-27 DIAGNOSIS — Z853 Personal history of malignant neoplasm of breast: Secondary | ICD-10-CM

## 2020-09-27 DIAGNOSIS — Z79890 Hormone replacement therapy (postmenopausal): Secondary | ICD-10-CM | POA: Diagnosis not present

## 2020-09-27 DIAGNOSIS — K8020 Calculus of gallbladder without cholecystitis without obstruction: Secondary | ICD-10-CM | POA: Diagnosis not present

## 2020-09-27 DIAGNOSIS — N281 Cyst of kidney, acquired: Secondary | ICD-10-CM | POA: Diagnosis not present

## 2020-09-27 DIAGNOSIS — E785 Hyperlipidemia, unspecified: Secondary | ICD-10-CM | POA: Diagnosis not present

## 2020-09-27 DIAGNOSIS — F329 Major depressive disorder, single episode, unspecified: Secondary | ICD-10-CM | POA: Diagnosis present

## 2020-09-27 DIAGNOSIS — K8062 Calculus of gallbladder and bile duct with acute cholecystitis without obstruction: Secondary | ICD-10-CM | POA: Diagnosis not present

## 2020-09-27 DIAGNOSIS — Z79899 Other long term (current) drug therapy: Secondary | ICD-10-CM

## 2020-09-27 DIAGNOSIS — Z01818 Encounter for other preprocedural examination: Secondary | ICD-10-CM | POA: Diagnosis not present

## 2020-09-27 DIAGNOSIS — Z9012 Acquired absence of left breast and nipple: Secondary | ICD-10-CM | POA: Diagnosis not present

## 2020-09-27 DIAGNOSIS — Z801 Family history of malignant neoplasm of trachea, bronchus and lung: Secondary | ICD-10-CM | POA: Diagnosis not present

## 2020-09-27 DIAGNOSIS — J9811 Atelectasis: Secondary | ICD-10-CM | POA: Diagnosis not present

## 2020-09-27 DIAGNOSIS — K8010 Calculus of gallbladder with chronic cholecystitis without obstruction: Secondary | ICD-10-CM | POA: Diagnosis not present

## 2020-09-27 DIAGNOSIS — E890 Postprocedural hypothyroidism: Secondary | ICD-10-CM | POA: Diagnosis present

## 2020-09-27 DIAGNOSIS — K219 Gastro-esophageal reflux disease without esophagitis: Secondary | ICD-10-CM | POA: Diagnosis present

## 2020-09-27 DIAGNOSIS — I4891 Unspecified atrial fibrillation: Secondary | ICD-10-CM | POA: Diagnosis not present

## 2020-09-27 DIAGNOSIS — R1013 Epigastric pain: Secondary | ICD-10-CM | POA: Diagnosis not present

## 2020-09-27 DIAGNOSIS — Z20822 Contact with and (suspected) exposure to covid-19: Secondary | ICD-10-CM | POA: Diagnosis present

## 2020-09-27 DIAGNOSIS — F419 Anxiety disorder, unspecified: Secondary | ICD-10-CM | POA: Diagnosis present

## 2020-09-27 DIAGNOSIS — Z743 Need for continuous supervision: Secondary | ICD-10-CM | POA: Diagnosis not present

## 2020-09-27 DIAGNOSIS — K810 Acute cholecystitis: Secondary | ICD-10-CM | POA: Diagnosis present

## 2020-09-27 LAB — CBC WITH DIFFERENTIAL/PLATELET
Abs Immature Granulocytes: 0.14 10*3/uL — ABNORMAL HIGH (ref 0.00–0.07)
Basophils Absolute: 0.1 10*3/uL (ref 0.0–0.1)
Basophils Relative: 1 %
Eosinophils Absolute: 0 10*3/uL (ref 0.0–0.5)
Eosinophils Relative: 0 %
HCT: 37.3 % (ref 36.0–46.0)
Hemoglobin: 11.8 g/dL — ABNORMAL LOW (ref 12.0–15.0)
Immature Granulocytes: 2 %
Lymphocytes Relative: 15 %
Lymphs Abs: 1.4 10*3/uL (ref 0.7–4.0)
MCH: 30 pg (ref 26.0–34.0)
MCHC: 31.6 g/dL (ref 30.0–36.0)
MCV: 94.9 fL (ref 80.0–100.0)
Monocytes Absolute: 0.5 10*3/uL (ref 0.1–1.0)
Monocytes Relative: 5 %
Neutro Abs: 7.3 10*3/uL (ref 1.7–7.7)
Neutrophils Relative %: 77 %
Platelets: 244 10*3/uL (ref 150–400)
RBC: 3.93 MIL/uL (ref 3.87–5.11)
RDW: 13.6 % (ref 11.5–15.5)
WBC: 9.5 10*3/uL (ref 4.0–10.5)
nRBC: 0 % (ref 0.0–0.2)

## 2020-09-27 LAB — COMPREHENSIVE METABOLIC PANEL
ALT: 193 U/L — ABNORMAL HIGH (ref 0–44)
AST: 287 U/L — ABNORMAL HIGH (ref 15–41)
Albumin: 3.4 g/dL — ABNORMAL LOW (ref 3.5–5.0)
Alkaline Phosphatase: 78 U/L (ref 38–126)
Anion gap: 7 (ref 5–15)
BUN: 10 mg/dL (ref 8–23)
CO2: 21 mmol/L — ABNORMAL LOW (ref 22–32)
Calcium: 7.4 mg/dL — ABNORMAL LOW (ref 8.9–10.3)
Chloride: 110 mmol/L (ref 98–111)
Creatinine, Ser: 0.57 mg/dL (ref 0.44–1.00)
GFR, Estimated: >60 mL/min (ref >60)
Glucose, Bld: 120 mg/dL — ABNORMAL HIGH (ref 70–99)
Potassium: 3.4 mmol/L — ABNORMAL LOW (ref 3.5–5.1)
Sodium: 138 mmol/L (ref 135–145)
Total Bilirubin: 0.4 mg/dL (ref 0.3–1.2)
Total Protein: 6.4 g/dL — ABNORMAL LOW (ref 6.5–8.1)

## 2020-09-27 LAB — URINALYSIS, ROUTINE W REFLEX MICROSCOPIC
Bacteria, UA: NONE SEEN
Bilirubin Urine: NEGATIVE
Glucose, UA: NEGATIVE mg/dL
Ketones, ur: 20 mg/dL — AB
Leukocytes,Ua: NEGATIVE
Nitrite: NEGATIVE
Protein, ur: 30 mg/dL — AB
RBC / HPF: >50 RBC/hpf — ABNORMAL HIGH (ref 0–5)
Specific Gravity, Urine: 1.02 (ref 1.005–1.030)
pH: 6 (ref 5.0–8.0)

## 2020-09-27 LAB — LIPASE, BLOOD: Lipase: 19 U/L (ref 11–51)

## 2020-09-27 MED ORDER — ONDANSETRON HCL 4 MG/2ML IJ SOLN
4.0000 mg | Freq: Once | INTRAMUSCULAR | Status: AC
  Administered 2020-09-27: 4 mg via INTRAVENOUS
  Filled 2020-09-27: qty 2

## 2020-09-27 MED ORDER — CIPROFLOXACIN IN D5W 400 MG/200ML IV SOLN
400.0000 mg | Freq: Once | INTRAVENOUS | Status: AC
  Administered 2020-09-27: 400 mg via INTRAVENOUS
  Filled 2020-09-27: qty 200

## 2020-09-27 MED ORDER — SODIUM CHLORIDE 0.9 % IV BOLUS
500.0000 mL | Freq: Once | INTRAVENOUS | Status: AC
  Administered 2020-09-27: 500 mL via INTRAVENOUS

## 2020-09-27 MED ORDER — SODIUM CHLORIDE 0.9 % IV SOLN
2.0000 g | INTRAVENOUS | Status: DC
  Administered 2020-09-27 – 2020-09-30 (×4): 2 g via INTRAVENOUS
  Filled 2020-09-27 (×4): qty 20

## 2020-09-27 MED ORDER — OXYCODONE HCL 5 MG PO TABS
5.0000 mg | ORAL_TABLET | ORAL | Status: DC | PRN
  Administered 2020-09-27 – 2020-10-01 (×8): 5 mg via ORAL
  Filled 2020-09-27 (×10): qty 1

## 2020-09-27 MED ORDER — ONDANSETRON HCL 4 MG/2ML IJ SOLN
4.0000 mg | Freq: Four times a day (QID) | INTRAMUSCULAR | Status: DC | PRN
  Administered 2020-09-27 – 2020-09-29 (×2): 4 mg via INTRAVENOUS
  Filled 2020-09-27 (×2): qty 2

## 2020-09-27 MED ORDER — IOHEXOL 300 MG/ML  SOLN
100.0000 mL | Freq: Once | INTRAMUSCULAR | Status: AC | PRN
  Administered 2020-09-27: 100 mL via INTRAVENOUS

## 2020-09-27 MED ORDER — PROPRANOLOL HCL 20 MG PO TABS
40.0000 mg | ORAL_TABLET | Freq: Every day | ORAL | Status: DC
  Administered 2020-09-27 – 2020-10-01 (×5): 40 mg via ORAL
  Filled 2020-09-27: qty 4
  Filled 2020-09-27 (×4): qty 2

## 2020-09-27 MED ORDER — ONDANSETRON HCL 4 MG/2ML IJ SOLN
4.0000 mg | Freq: Once | INTRAMUSCULAR | Status: DC
Start: 2020-09-27 — End: 2020-09-27

## 2020-09-27 MED ORDER — MORPHINE SULFATE (PF) 2 MG/ML IV SOLN
2.0000 mg | INTRAVENOUS | Status: DC | PRN
  Administered 2020-09-29: 2 mg via INTRAVENOUS
  Filled 2020-09-27: qty 1

## 2020-09-27 MED ORDER — CITALOPRAM HYDROBROMIDE 20 MG PO TABS
40.0000 mg | ORAL_TABLET | Freq: Every day | ORAL | Status: DC
  Administered 2020-09-27 – 2020-10-01 (×5): 40 mg via ORAL
  Filled 2020-09-27 (×5): qty 2

## 2020-09-27 MED ORDER — ONDANSETRON HCL 4 MG PO TABS
4.0000 mg | ORAL_TABLET | Freq: Four times a day (QID) | ORAL | Status: DC | PRN
  Filled 2020-09-27: qty 1

## 2020-09-27 MED ORDER — SODIUM CHLORIDE 0.9 % IV SOLN: INTRAVENOUS | Status: DC

## 2020-09-27 MED ORDER — PANTOPRAZOLE SODIUM 40 MG PO TBEC
40.0000 mg | DELAYED_RELEASE_TABLET | Freq: Every day | ORAL | Status: DC
  Administered 2020-09-27 – 2020-10-01 (×5): 40 mg via ORAL
  Filled 2020-09-27 (×5): qty 1

## 2020-09-27 MED ORDER — HEPARIN SODIUM (PORCINE) 5000 UNIT/ML IJ SOLN
5000.0000 [IU] | Freq: Three times a day (TID) | INTRAMUSCULAR | Status: DC
  Administered 2020-09-27 – 2020-10-01 (×9): 5000 [IU] via SUBCUTANEOUS
  Filled 2020-09-27 (×10): qty 1

## 2020-09-27 MED ORDER — BUPROPION HCL ER (XL) 150 MG PO TB24
150.0000 mg | ORAL_TABLET | Freq: Every day | ORAL | Status: DC
  Administered 2020-09-27 – 2020-10-01 (×5): 150 mg via ORAL
  Filled 2020-09-27 (×5): qty 1

## 2020-09-27 MED ORDER — FENTANYL CITRATE (PF) 100 MCG/2ML IJ SOLN
25.0000 ug | Freq: Once | INTRAMUSCULAR | Status: AC
  Administered 2020-09-27: 25 ug via INTRAVENOUS
  Filled 2020-09-27: qty 2

## 2020-09-27 MED ORDER — FENTANYL CITRATE (PF) 100 MCG/2ML IJ SOLN
25.0000 ug | Freq: Once | INTRAMUSCULAR | Status: AC
Start: 2020-09-27 — End: 2020-09-27
  Administered 2020-09-27: 25 ug via INTRAVENOUS
  Filled 2020-09-27: qty 2

## 2020-09-27 MED ORDER — METRONIDAZOLE 500 MG PO TABS
500.0000 mg | ORAL_TABLET | Freq: Three times a day (TID) | ORAL | Status: DC
  Administered 2020-09-27 – 2020-09-30 (×8): 500 mg via ORAL
  Filled 2020-09-27 (×8): qty 1

## 2020-09-27 MED ORDER — LEVOTHYROXINE SODIUM 88 MCG PO TABS
88.0000 ug | ORAL_TABLET | Freq: Every day | ORAL | Status: DC
  Administered 2020-09-27 – 2020-10-01 (×5): 88 ug via ORAL
  Filled 2020-09-27 (×5): qty 1

## 2020-09-27 NOTE — ED Provider Notes (Signed)
[facility] EMERGENCY DEPARTMENT Provider Note   CSN: [id number] Arrival date & time: 09/27/20  1034     History Chief Complaint  Patient presents with  . Abdominal Pain    Ariel Henderson is a 75 y.o. female.  HPI      Ariel Henderson is a 75 y.o. female with past medical history significant for hypertension, GERD, and breast cancer (currently in remission) who presents to the Emergency Department complaining of upper abdominal pain, nausea and vomiting.  Patient began having focal epigastric pain around 1 PM yesterday.  She describes the pain as sharp and constant since onset.  Nonradiating.  She developed nausea and multiple episodes of vomiting since 1 AM this morning.  She has been unable to tolerate liquids or solid foods.  No alleviating factors.  She denies any hematemesis, fever, chills, or dysuria.  No chest pain or shortness of breath.  No known COVID exposures or recent illness.  She has been vaccinated x2 for COVID-19.  Last meal was 1:00 pm yesterday    Past Medical History:  Diagnosis Date  . Breast cancer ([place]) 12/29/12 bx   left breast ductal,high grade ductal ca in situ w/asoc comedo necrosis  . Cancer ([place])    cervical per patient  . Chronic anxiety   . Complication of anesthesia    "slow to wake up"  . Dyslipidemia   . Fibrocystic breast disease   . GERD (gastroesophageal reflux disease)   . History of recurrent UTIs   . Hypertension   . Hypothyroidism   . Major depression   . Melanoma ([place])    per patient on 02/01/13; located on back; dx in 7s  . Migraine headache   . Multinodular goiter   . Peripheral vascular disease ([facility])    varicose veins  . Pneumonia    hx  . Seasonal allergies   . Thyroid cancer ([place])    per patient on 02/01/2013; dx in 28s    Patient Active Problem List   Diagnosis Date Noted  . Allergic rhinitis 05/28/2020  . Gastroesophageal reflux disease without esophagitis 05/28/2020  . Genetic testing 05/25/2017  . Arthritis  03/31/2017  . Hyperlipidemia 10/03/2014  . HTN (hypertension) 10/03/2014  . Anxiety 02/12/2014  . Depression, major, single episode, mild ([place]) 02/12/2014  . History of thyroid cancer 02/01/2013  . Melanoma of skin ([place]) 02/01/2013  . Family history of malignant neoplasm of gastrointestinal tract 02/01/2013  . Family history of ovarian cancer 02/01/2013  . Ductal carcinoma in situ (DCIS) of left breast 01/10/2013    Past Surgical History:  Procedure Laterality Date  . ABDOMINAL HYSTERECTOMY    . BREAST DUCTAL SYSTEM EXCISION Left 12/29/2012   Procedure: EXCISION DUCTAL SYSTEM BREAST;  Surgeon: Haywood Lasso, MD;  Location: [facility];  Service: General;  Laterality: Left;  . BREAST LUMPECTOMY     left   x 3, right x 2  . BREAST LUMPECTOMY Left 12/2009   lower inner=fibrocystic changes/sclerosing adenosis  . BREAST RECONSTRUCTION WITH PLACEMENT OF TISSUE EXPANDER AND FLEX HD (ACELLULAR HYDRATED DERMIS) Left 02/09/2013   Procedure: LEFT BREAST RECONSTRUCTION WITH PLACEMENT OF TISSUE EXPANDER ;  Surgeon: Crissie Reese, MD;  Location: [place];  Service: Plastics;  Laterality: Left;  . EYE SURGERY Bilateral    cataract extraction with IOL  . LEFT HEART CATHETERIZATION WITH CORONARY ANGIOGRAM N/A 10/10/2014   Procedure: LEFT HEART CATHETERIZATION WITH CORONARY ANGIOGRAM;  Surgeon: Peter M Martinique, MD;  Location: [facility] CATH LAB;  Service: Cardiovascular;  Laterality: N/A;  . MASTECTOMY W/ SENTINEL NODE BIOPSY Left 02/09/2013   Procedure: TOTAL MASTECTOMY WITH SENTINEL LYMPH NODE BIOPSY;  Surgeon: Haywood Lasso, MD;  Location: [place];  Service: General;  Laterality: Left;  . THYROIDECTOMY  1991  . TOTAL MASTECTOMY Left 02/09/2013   WITH TISSUE EXPANDER & BIOPSY  . WRIST FRACTURE SURGERY       OB History   No obstetric history on file.     Family History  Problem Relation Age of Onset  . Colon cancer Brother 18  . Prostate cancer Brother 27  . Lung cancer Father 4       smoker  . Ovarian  cancer Sister 63  . Brain cancer Sister 55  . Prostate cancer Brother 2  . Prostate cancer Brother 65  . Prostate cancer Brother 52  . Prostate cancer Brother 95  . Cancer Brother 44       metastatic; unknown primary    Social History   Tobacco Use  . Smoking status: Never Smoker  . Smokeless tobacco: Never Used  Substance Use Topics  . Alcohol use: No  . Drug use: No    Home Medications Prior to Admission medications   Medication Sig Start Date End Date Taking? Authorizing Provider  benzonatate (TESSALON) 100 MG capsule Take 1 capsule (100 mg total) by mouth every 8 (eight) hours. Patient not taking: Reported on 04/01/2018 09/20/17   Ok Edwards, PA-C  buPROPion (WELLBUTRIN XL) 150 MG 24 hr tablet Take 1 tablet (150 mg total) by mouth daily. 07/11/20   Denita Lung, MD  Calcium Carbonate-Vit D-Min (CALCIUM 1200 PO) Take 1,000 mg by mouth daily.     [provider]  citalopram (CELEXA) 40 MG tablet Take 1 tablet (40 mg total) by mouth daily. 07/11/20   Denita Lung, MD  fluticasone (FLONASE) 50 MCG/ACT nasal spray Place 2 sprays into both nostrils daily. Patient not taking: Reported on 04/01/2018 09/20/17   Ok Edwards, PA-C  levothyroxine (SYNTHROID) 88 MCG tablet Take 1 tablet (88 mcg total) by mouth daily. 07/11/20   Denita Lung, MD  lovastatin (MEVACOR) 40 MG tablet Take 1 tablet (40 mg total) by mouth at bedtime. 07/11/20   Denita Lung, MD  nitroGLYCERIN (NITROSTAT) 0.4 MG SL tablet Place 1 tablet (0.4 mg total) under the tongue every 5 (five) minutes as needed for chest pain. 10/03/14   Martinique, Peter M, MD  omeprazole (PRILOSEC) 40 MG capsule Take 1 capsule (40 mg total) by mouth daily. 07/11/20   Denita Lung, MD  propranolol (INDERAL) 40 MG tablet TAKE 1 TABLET BY MOUTH  DAILY 09/28/18   Denita Lung, MD  ranitidine (ZANTAC) 150 MG tablet Take 150 mg by mouth 2 (two) times daily. Patient not taking: Reported on 05/28/2020    [provider]   SUMAtriptan (IMITREX) 100 MG tablet Take 1 tablet (100 mg total) by mouth every 2 (two) hours as needed for migraine. May repeat in 2 hours if headache persists or recurs. 07/11/20   Denita Lung, MD  tamoxifen (NOLVADEX) 20 MG tablet TAKE 1 TABLET BY MOUTH  DAILY Patient not taking: Reported on 05/28/2020 08/31/19   Nicholas Lose, MD    Allergies    Aspirin  Review of Systems   Review of Systems  Constitutional: Positive for appetite change. Negative for chills and fever.  HENT: Negative for trouble swallowing.   Respiratory: Negative for cough and shortness of  breath.   Cardiovascular: Negative for chest pain.  Gastrointestinal: Positive for abdominal pain, nausea and vomiting. Negative for blood in stool and diarrhea.  Genitourinary: Negative for difficulty urinating, dysuria and flank pain.  Musculoskeletal: Negative for back pain.  Skin: Negative for color change and rash.  Neurological: Negative for dizziness, weakness, numbness and headaches.  Hematological: Negative for adenopathy.    Physical Exam Updated Vital Signs BP (!) 167/85 (BP Location: Left Arm)   Pulse 70   Temp 98.2 F (36.8 C) (Oral)   Resp 18   Ht 5\' 3"  (1.6 m)   Wt 78.5 kg   SpO2 97%   BMI 30.65 kg/m   Physical Exam Vitals and nursing note reviewed.  Constitutional:      General: She is not in acute distress.    Appearance: She is well-developed and well-nourished. She is not toxic-appearing.  HENT:     Head: Normocephalic.     Mouth/Throat:     Mouth: Oropharynx is clear and moist. Mucous membranes are dry.  Eyes:     Conjunctiva/sclera: Conjunctivae normal.  Cardiovascular:     Rate and Rhythm: Normal rate and regular rhythm.     Pulses: Intact distal pulses.     Heart sounds: Normal heart sounds. No murmur heard.   Pulmonary:     Effort: Pulmonary effort is normal. No respiratory distress.     Breath sounds: Normal breath sounds.  Abdominal:     General: Bowel sounds are normal.  There is no distension.     Palpations: Abdomen is soft. There is no mass.     Tenderness: There is abdominal tenderness in the epigastric area. There is no right CVA tenderness, left CVA tenderness, guarding or rebound. Negative signs include McBurney's sign.  Musculoskeletal:        General: No edema. Normal range of motion.  Lymphadenopathy:     Cervical: No cervical adenopathy.  Skin:    General: Skin is warm.     Capillary Refill: Capillary refill takes less than 2 seconds.     Findings: No rash.  Neurological:     General: No focal deficit present.     Mental Status: She is alert.     Sensory: No sensory deficit.     Motor: No weakness or abnormal muscle tone.     ED Results / Procedures / Treatments   Labs (all labs ordered are listed, but only abnormal results are displayed) Labs Reviewed  CBC WITH DIFFERENTIAL/PLATELET - Abnormal; Notable for the following components:      Result Value   Hemoglobin 11.8 (*)    Abs Immature Granulocytes 0.14 (*)    All other components within normal limits  COMPREHENSIVE METABOLIC PANEL - Abnormal; Notable for the following components:   Potassium 3.4 (*)    CO2 21 (*)    Glucose, Bld 120 (*)    Calcium 7.4 (*)    Total Protein 6.4 (*)    Albumin 3.4 (*)    AST 287 (*)    ALT 193 (*)    All other components within normal limits  URINALYSIS, ROUTINE W REFLEX MICROSCOPIC - Abnormal; Notable for the following components:   Hgb urine dipstick LARGE (*)    Ketones, ur 20 (*)    Protein, ur 30 (*)    RBC / HPF >50 (*)    All other components within normal limits  SARS CORONAVIRUS 2 BY RT PCR (HOSPITAL ORDER, [facility])  LIPASE, BLOOD  EKG None  Radiology CT Abdomen Pelvis W Contrast  Result Date: 09/27/2020 CLINICAL DATA:  Nausea, vomiting, epigastric and centralized abdominal pain, symptoms for 3 days EXAM: CT ABDOMEN AND PELVIS WITH CONTRAST TECHNIQUE: Multidetector CT imaging of the abdomen and  pelvis was performed using the standard protocol following bolus administration of intravenous contrast. Sagittal and coronal MPR images reconstructed from axial data set. CONTRAST:  142mL OMNIPAQUE IOHEXOL 300 MG/ML SOLN IV. No oral contrast. COMPARISON:  None FINDINGS: Lower chest: Minimal atelectasis at LEFT base. LEFT breast prosthesis noted. Hepatobiliary: Scattered low attenuation lesions within the liver likely cysts, largest lateral segment LEFT lobe 2.2 x 2.1 cm image 22. Mild thickening of gallbladder wall with minimal adjacent edema. Subtle increased attenuation centrally within gallbladder, cannot exclude noncalcified stones. Findings are concerning for cholecystitis. No biliary dilatation. Pancreas: Normal appearance Spleen: Normal a porous Adrenals/Urinary Tract: Adrenal glands and RIGHT kidney normal appearance. Large cyst at upper pole LEFT kidney 6.1 x 5.4 cm image 30. Additional tiny LEFT renal cysts. Indeterminate lesion at inferior pole LEFT kidney 9 x 8 mm, containing slight wall irregularity, intermediate attenuation, and minimal internal complexity. No urinary tract calcification or dilatation. Bladder and ureters unremarkable. Stomach/Bowel: Normal appendix. Small hiatal hernia. Stomach and bowel loops otherwise normal appearance Vascular/Lymphatic: Atherosclerotic calcifications aorta without aneurysm. No adenopathy Reproductive: Uterus surgically absent.  Ovaries unremarkable. Other: No free air or free fluid.  No hernia. Musculoskeletal: Unremarkable IMPRESSION: Mild thickening of gallbladder wall with minimal adjacent edema and questionable noncalcified stones, findings which are concerning for cholecystitis, recommend correlation with ultrasound. Multiple hepatic and LEFT renal cysts. Indeterminate 9 x 8 mm intermediate attenuation inferior pole LEFT renal lesion, recommend characterization by non emergent MR imaging with and without contrast to exclude small renal neoplasm. Small hiatal  hernia. Aortic Atherosclerosis (ICD10-I70.0). Electronically Signed   By: Lavonia Dana M.D.   On: 09/27/2020 12:54   US Abdomen Limited  Result Date: 09/27/2020 CLINICAL DATA:  Upper abdominal pain EXAM: ULTRASOUND ABDOMEN LIMITED RIGHT UPPER QUADRANT COMPARISON:  CT abdomen and pelvis September 27, 2020 FINDINGS: Gallbladder: Within the gallbladder, there are echogenic foci which move and shadow consistent with cholelithiasis. Largest individual gallstone measures 2.0 cm. The gallbladder wall is thickened and edematous. There is equivocal pericholecystic fluid. Patient is focally tender over the gallbladder. Common bile duct: Diameter: 3 mm. No intrahepatic or extrahepatic biliary duct dilatation. Liver: There is a septated cyst in the right lobe of the liver measuring 2.4 x 1.3 x 1.8 cm. Within normal limits in parenchymal echogenicity. Portal vein is patent on color Doppler imaging with normal direction of blood flow towards the liver. Other: There is a cyst arising from the upper pole the right kidney measuring 5.1 x 4.9 x 5.2 cm. IMPRESSION: 1. Cholelithiasis with thickened, edematous gallbladder wall and slight pericholecystic fluid. Patient is focally tender over the gallbladder. These are findings indicative of acute cholecystitis. 2. Septated cyst right lobe of liver measuring 2.4 x 1.3 x 1.8 cm. No other focal liver lesions evident. 3. Cyst arising from upper pole right kidney measuring 5.1 x 4.9 x 5.2 cm. Electronically Signed   By: Lowella Grip III M.D.   On: 09/27/2020 14:31    Procedures Procedures (including critical care time)  Medications Ordered in ED Medications  sodium chloride 0.9 % bolus 500 mL (has no administration in time range)  ondansetron (ZOFRAN) injection 4 mg (has no administration in time range)  fentaNYL (SUBLIMAZE) injection 25 mcg (has no administration in  time range)    ED Course  I have reviewed the triage vital signs and the nursing notes.  Pertinent labs &  imaging results that were available during my care of the patient were reviewed by me and considered in my medical decision making (see chart for details).    MDM Rules/Calculators/A&P                          Patient here with onset of epigastric pain yesterday, nausea vomiting since this morning.  She has been given Zofran by EMS prior to arrival.  No active vomiting.  On exam, she does have some epigastric tenderness, abdomen is soft no peritoneal signs.  Mucous membranes are dry she is nontoxic-appearing.  Vital signs reviewed.  We will proceed with labs and CT abdomen and pelvis.  Labs reviewed by me, no fever or leukocytosis.  AST and ALT are elevated, total bilirubin within normal limits.  Lipase within normal limits.  CT abdomen pelvis shows cholelithiasis with gallbladder wall thickening and further evaluation with ultrasound recommended by radiology  Ultrasound confirms cholelithiasis with thickening and edematous gallbladder wall and slight pericholecystic fluid present.  Suggestive of acute cholecystitis.  Patient is n.p.o., will consult general surgery.  Consulted general surgery, Dr. Arnoldo Morale and discussed findings.  He recommends clear fluids and ciprofloxacin with admission to hospitalist service and he will consult.  Consulted [facility] hospitalist, Dr. Waldron Labs who agrees to admit.  Final Clinical Impression(s) / ED Diagnoses Final diagnoses:  Acute cholecystitis    Rx / DC Orders ED Discharge Orders    None       Kem Parkinson, PA-C 09/27/20 1616    Horton, Alvin Critchley, DO 10/01/20 1342

## 2020-09-27 NOTE — H&P (Signed)
TRH H&P   Patient Demographics:    Ariel Henderson, is a 75 y.o. female  MRN: [id number]   DOB - 18-Feb-1946  Admit Date - 09/27/2020  Outpatient Primary MD for the patient is Denita Lung, MD  Referring MD/NP/PA: PA Triplett   Patient coming from: Home  Chief Complaint  Patient presents with  . Abdominal Pain      HPI:    Ariel Henderson  is a 75 y.o. female, with past medical history of hypertension, hyperlipidemia, GERD, breast cancer in remission, she presents to ED secondary to complaints of abdominal pain, nausea and vomiting, reports pain started in epigastric area at 1 PM, kept progressing, sharp, nonradiating, she did develop nausea and vomiting at 1 AM, unable to tolerate any oral intake, liquids or solids, pain was intolerable, so she came to ED, no coffee-ground emesis, fever, chills, dysuria, polyuria, diarrhea or constipation, she denies any chest pain or shortness of breath. -In ED patient was afebrile, work-up significant for elevated AST at 287, ALT at 193, lipase within normal limits at 19, alk phos within normal limit at 78, total bili within normal limit at 0.4, CT abdomen pelvis and ultrasound significant for acute cholecystitis/choledocholithiasis, [facility]ist consulted to admit.    Review of systems:    In addition to the HPI above, No Fever-chills, poor appetite No Headache, No changes with Vision or hearing, No problems swallowing food or Liquids, No Chest pain, Cough or Shortness of Breath, Complains of abdominal pain, nausea and vomiting bowel movements are regular, No Blood in stool or Urine, No dysuria, No new skin rashes or bruises, No new joints pains-aches,  No new weakness, tingling, numbness in any extremity, No recent weight gain or loss, No polyuria, polydypsia or polyphagia, No significant Mental Stressors.  A full 10 point  Review of Systems was done, except as stated above, all other Review of Systems were negative.   With Past History of the following :    Past Medical History:  Diagnosis Date  . Breast cancer ([place]) 12/29/12 bx   left breast ductal,high grade ductal ca in situ w/asoc comedo necrosis  . Cancer ([place] [facility])    cervical per patient  . Chronic anxiety   . Complication of anesthesia    "slow to wake up"  . Dyslipidemia   . Fibrocystic breast disease   . GERD (gastroesophageal reflux disease)   . History of recurrent UTIs   . Hypertension   . Hypothyroidism   . Major depression   . Melanoma ([facility])    per patient on 02/01/13; located on back; dx in 43s  . Migraine headache   . Multinodular goiter   . Peripheral vascular disease ([facility])    varicose veins  . Pneumonia    hx  . Seasonal allergies   . Thyroid cancer ([facility])    per patient on 02/01/2013; dx in 41s  Past Surgical History:  Procedure Laterality Date  . ABDOMINAL HYSTERECTOMY    . BREAST DUCTAL SYSTEM EXCISION Left 12/29/2012   Procedure: EXCISION DUCTAL SYSTEM BREAST;  Surgeon: Haywood Lasso, MD;  Location: [facility];  Service: General;  Laterality: Left;  . BREAST LUMPECTOMY     left   x 3, right x 2  . BREAST LUMPECTOMY Left 12/2009   lower inner=fibrocystic changes/sclerosing adenosis  . BREAST RECONSTRUCTION WITH PLACEMENT OF TISSUE EXPANDER AND FLEX HD (ACELLULAR HYDRATED DERMIS) Left 02/09/2013   Procedure: LEFT BREAST RECONSTRUCTION WITH PLACEMENT OF TISSUE EXPANDER ;  Surgeon: Crissie Reese, MD;  Location: [facility];  Service: Plastics;  Laterality: Left;  . EYE SURGERY Bilateral    cataract extraction with IOL  . LEFT HEART CATHETERIZATION WITH CORONARY ANGIOGRAM N/A 10/10/2014   Procedure: LEFT HEART CATHETERIZATION WITH CORONARY ANGIOGRAM;  Surgeon: Peter M Martinique, MD;  Location: [facility];  Service: Cardiovascular;  Laterality: N/A;  . MASTECTOMY W/ SENTINEL NODE BIOPSY Left 02/09/2013   Procedure: TOTAL MASTECTOMY WITH  SENTINEL LYMPH NODE BIOPSY;  Surgeon: Haywood Lasso, MD;  Location: [facility];  Service: General;  Laterality: Left;  . THYROIDECTOMY  1991  . TOTAL MASTECTOMY Left 02/09/2013   WITH TISSUE EXPANDER & BIOPSY  . WRIST FRACTURE SURGERY        Social History:     Social History   Tobacco Use  . Smoking status: Never Smoker  . Smokeless tobacco: Never Used  Substance Use Topics  . Alcohol use: No    Family History :     Family History  Problem Relation Age of Onset  . Colon cancer Brother 72  . Prostate cancer Brother 58  . Lung cancer Father 79       smoker  . Ovarian cancer Sister 66  . Brain cancer Sister 24  . Prostate cancer Brother 30  . Prostate cancer Brother 83  . Prostate cancer Brother 61  . Prostate cancer Brother 14  . Cancer Brother 53       metastatic; unknown primary      Home Medications:   Prior to Admission medications   Medication Sig Start Date End Date Taking? Authorizing Provider  benzonatate (TESSALON) 100 MG capsule Take 1 capsule (100 mg total) by mouth every 8 (eight) hours. Patient not taking: Reported on 04/01/2018 09/20/17   Ok Edwards, PA-C  buPROPion (WELLBUTRIN XL) 150 MG 24 hr tablet Take 1 tablet (150 mg total) by mouth daily. 07/11/20   Denita Lung, MD  Calcium Carbonate-Vit D-Min (CALCIUM 1200 PO) Take 1,000 mg by mouth daily.     [provider]  citalopram (CELEXA) 40 MG tablet Take 1 tablet (40 mg total) by mouth daily. 07/11/20   Denita Lung, MD  fluticasone (FLONASE) 50 MCG/ACT nasal spray Place 2 sprays into both nostrils daily. Patient not taking: Reported on 04/01/2018 09/20/17   Ok Edwards, PA-C  levothyroxine (SYNTHROID) 88 MCG tablet Take 1 tablet (88 mcg total) by mouth daily. 07/11/20   Denita Lung, MD  lovastatin (MEVACOR) 40 MG tablet Take 1 tablet (40 mg total) by mouth at bedtime. 07/11/20   Denita Lung, MD  nitroGLYCERIN (NITROSTAT) 0.4 MG SL tablet Place 1 tablet (0.4 mg total) under the tongue  every 5 (five) minutes as needed for chest pain. 10/03/14   Martinique, Peter M, MD  omeprazole (PRILOSEC) 40 MG capsule Take 1 capsule (40 mg total) by mouth daily. 07/11/20  Denita Lung, MD  propranolol (INDERAL) 40 MG tablet TAKE 1 TABLET BY MOUTH  DAILY 09/28/18   Denita Lung, MD  ranitidine (ZANTAC) 150 MG tablet Take 150 mg by mouth 2 (two) times daily. Patient not taking: Reported on 05/28/2020    [provider]  SUMAtriptan (IMITREX) 100 MG tablet Take 1 tablet (100 mg total) by mouth every 2 (two) hours as needed for migraine. May repeat in 2 hours if headache persists or recurs. 07/11/20   Denita Lung, MD  tamoxifen (NOLVADEX) 20 MG tablet TAKE 1 TABLET BY MOUTH  DAILY Patient not taking: Reported on 05/28/2020 08/31/19   Nicholas Lose, MD     Allergies:     Allergies  Allergen Reactions  . Aspirin Nausea Only and Other (See Comments)    Non coated CAN TAKE 81 MG      Physical Exam:   Vitals  Blood pressure (!) 153/78, pulse 78, temperature 98.2 F (36.8 C), temperature source Oral, resp. rate (!) 23, height _0  (1.6 m), weight 78.5 kg, SpO2 94 %.   1. General developed female, laying in bed, no apparent distress  2. Normal affect and insight, Not Suicidal or Homicidal, Awake Alert, Oriented X 3.  3. No F.N deficits, ALL C.Nerves Intact, Strength 5/5 all 4 extremities, Sensation intact all 4 extremities, Plantars down going.  4. Ears and Eyes appear Normal, Conjunctivae clear, PERRLA. Moist Oral Mucosa.  5. Supple Neck, No JVD, No cervical lymphadenopathy appriciated, No Carotid Bruits.  6. Symmetrical Chest wall movement, Good air movement bilaterally, CTAB.  7. RRR, No Gallops, Rubs or Murmurs, No Parasternal Heave.  8. Positive Bowel Sounds, he has mild diffuse tenderness, mainly in epigastric area but no rebound -guarding or rigidity.  9.  No Cyanosis, Normal Skin Turgor, No Skin Rash or Bruise.  10. Good muscle tone,  joints appear normal ,  no effusions, Normal ROM.    Data Review:    CBC Recent Labs  Lab 09/27/20 1135  WBC 9.5  HGB 11.8*  HCT 37.3  PLT 244  MCV 94.9  MCH 30.0  MCHC 31.6  RDW 13.6  LYMPHSABS 1.4  MONOABS 0.5  EOSABS 0.0  BASOSABS 0.1   ------------------------------------------------------------------------------------------------------------------  Chemistries  Recent Labs  Lab 09/27/20 1135  NA 138  K 3.4*  CL 110  CO2 21*  GLUCOSE 120*  BUN 10  CREATININE 0.57  CALCIUM 7.4*  AST 287*  ALT 193*  ALKPHOS 78  BILITOT 0.4   ------------------------------------------------------------------------------------------------------------------ estimated creatinine clearance is 60.2 mL/min (by C-G formula based on SCr of 0.57 mg/dL). ------------------------------------------------------------------------------------------------------------------ No results for input(s): TSH, T4TOTAL, T3FREE, THYROIDAB in the last 72 hours.  Invalid input(s): FREET3  Coagulation profile No results for input(s): INR, PROTIME in the last 168 hours. ------------------------------------------------------------------------------------------------------------------- No results for input(s): DDIMER in the last 72 hours. -------------------------------------------------------------------------------------------------------------------  Cardiac Enzymes No results for input(s): CKMB, TROPONINI, MYOGLOBIN in the last 168 hours.  Invalid input(s): CK ------------------------------------------------------------------------------------------------------------------ No results found for: BNP   ---------------------------------------------------------------------------------------------------------------  Urinalysis    Component Value Date/Time   COLORURINE YELLOW 09/27/2020 [place] 09/27/2020 1123   LABSPEC 1.020 09/27/2020 1123   PHURINE 6.0 09/27/2020 1123   GLUCOSEU NEGATIVE 09/27/2020  1123   HGBUR LARGE (A) 09/27/2020 1123   BILIRUBINUR NEGATIVE 09/27/2020 1123   KETONESUR 20 (A) 09/27/2020 1123   PROTEINUR 30 (A) 09/27/2020 1123   UROBILINOGEN 1.0 03/05/2010 2113   NITRITE NEGATIVE 09/27/2020 1123   LEUKOCYTESUR  NEGATIVE 09/27/2020 1123    ----------------------------------------------------------------------------------------------------------------   Imaging Results:    CT Abdomen Pelvis W Contrast  Result Date: 09/27/2020 CLINICAL DATA:  Nausea, vomiting, epigastric and centralized abdominal pain, symptoms for 3 days EXAM: CT ABDOMEN AND PELVIS WITH CONTRAST TECHNIQUE: Multidetector CT imaging of the abdomen and pelvis was performed using the standard protocol following bolus administration of intravenous contrast. Sagittal and coronal MPR images reconstructed from axial data set. CONTRAST:  13m OMNIPAQUE IOHEXOL 300 MG/ML SOLN IV. No oral contrast. COMPARISON:  None FINDINGS: Lower chest: Minimal atelectasis at LEFT base. LEFT breast prosthesis noted. Hepatobiliary: Scattered low attenuation lesions within the liver likely cysts, largest lateral segment LEFT lobe 2.2 x 2.1 cm image 22. Mild thickening of gallbladder wall with minimal adjacent edema. Subtle increased attenuation centrally within gallbladder, cannot exclude noncalcified stones. Findings are concerning for cholecystitis. No biliary dilatation. Pancreas: Normal appearance Spleen: Normal a porous Adrenals/Urinary Tract: Adrenal glands and RIGHT kidney normal appearance. Large cyst at upper pole LEFT kidney 6.1 x 5.4 cm image 30. Additional tiny LEFT renal cysts. Indeterminate lesion at inferior pole LEFT kidney 9 x 8 mm, containing slight wall irregularity, intermediate attenuation, and minimal internal complexity. No urinary tract calcification or dilatation. Bladder and ureters unremarkable. Stomach/Bowel: Normal appendix. Small hiatal hernia. Stomach and bowel loops otherwise normal appearance  Vascular/Lymphatic: Atherosclerotic calcifications aorta without aneurysm. No adenopathy Reproductive: Uterus surgically absent.  Ovaries unremarkable. Other: No free air or free fluid.  No hernia. Musculoskeletal: Unremarkable IMPRESSION: Mild thickening of gallbladder wall with minimal adjacent edema and questionable noncalcified stones, findings which are concerning for cholecystitis, recommend correlation with ultrasound. Multiple hepatic and LEFT renal cysts. Indeterminate 9 x 8 mm intermediate attenuation inferior pole LEFT renal lesion, recommend characterization by non emergent MR imaging with and without contrast to exclude small renal neoplasm. Small hiatal hernia. Aortic Atherosclerosis (ICD10-I70.0). Electronically Signed   By: MLavonia DanaM.D.   On: 09/27/2020 12:54   UKoreaAbdomen Limited  Result Date: 09/27/2020 CLINICAL DATA:  Upper abdominal pain EXAM: ULTRASOUND ABDOMEN LIMITED RIGHT UPPER QUADRANT COMPARISON:  CT abdomen and pelvis September 27, 2020 FINDINGS: Gallbladder: Within the gallbladder, there are echogenic foci which move and shadow consistent with cholelithiasis. Largest individual gallstone measures 2.0 cm. The gallbladder wall is thickened and edematous. There is equivocal pericholecystic fluid. Patient is focally tender over the gallbladder. Common bile duct: Diameter: 3 mm. No intrahepatic or extrahepatic biliary duct dilatation. Liver: There is a septated cyst in the right lobe of the liver measuring 2.4 x 1.3 x 1.8 cm. Within normal limits in parenchymal echogenicity. Portal vein is patent on color Doppler imaging with normal direction of blood flow towards the liver. Other: There is a cyst arising from the upper pole the right kidney measuring 5.1 x 4.9 x 5.2 cm. IMPRESSION: 1. Cholelithiasis with thickened, edematous gallbladder wall and slight pericholecystic fluid. Patient is focally tender over the gallbladder. These are findings indicative of acute cholecystitis. 2. Septated  cyst right lobe of liver measuring 2.4 x 1.3 x 1.8 cm. No other focal liver lesions evident. 3. Cyst arising from upper pole right kidney measuring 5.1 x 4.9 x 5.2 cm. Electronically Signed   By: WLowella GripIII M.D.   On: 09/27/2020 14:31    My personal review of EKG: Pending   Assessment & Plan:    Active Problems:   HTN (hypertension)   Gastroesophageal reflux disease without esophagitis   Acute cholecystitis   Acute cholecystitis/choledocholithiasis -With acute cholecystitis,  she will be started on Rocephin, Flagyl, she will kept on clear liquid diet, she will be on IV fluids, as needed pain medications. -Neurosurgery were consulted by ED, they will evaluate, current recommendation antibiotics and clear liquid diet, likely will wait for 48 hours on IV antibiotic before proceeding with surgery.  Transaminitis -To above, will hold on statin, and will avoid Tylenol.  Hypertension -Continue with propranolol  Anxiety/depression-new with home medications  History of breast cancer -Closely by oncology, used to be in remission, tamoxifen was stopped last year  Indeterminate 9 x 8 mm intermediate attenuation inferior pole LEFT renal lesion: -Radiology recommend characterization by non emergent MR imaging with and without contrast to exclude small renal neoplasm.  Can be done as an outpatient once stable.  DVT Prophylaxis Heparin   AM Labs Ordered, also please review Full Orders  Family Communication: Admission, patients condition and plan of care including tests being ordered have been discussed with the patient who indicate understanding and agree with the plan and Code Status.  Code Status fULL  Likely DC to  HOME  Condition GUARDED    Consults called: General surgery by ED    Admission status: inpatient    Time spent in minutes : 55 minutes   Phillips Climes M.D on 09/27/2020 at 3:51 PM   [facility] - Office  [phone]

## 2020-09-27 NOTE — ED Triage Notes (Signed)
Pt c/o upper abdominal pain and N/V since 1pm yesterday. Pt states she vomited approx. 8 times last night. 1 episode of "clear, green" emesis en route. Per EMS 4mg  zofran given with relief.

## 2020-09-28 ENCOUNTER — Inpatient Hospital Stay (HOSPITAL_COMMUNITY): Payer: Medicare Other

## 2020-09-28 DIAGNOSIS — K219 Gastro-esophageal reflux disease without esophagitis: Secondary | ICD-10-CM

## 2020-09-28 DIAGNOSIS — K8000 Calculus of gallbladder with acute cholecystitis without obstruction: Secondary | ICD-10-CM

## 2020-09-28 LAB — COMPREHENSIVE METABOLIC PANEL
ALT: 131 U/L — ABNORMAL HIGH (ref 0–44)
AST: 105 U/L — ABNORMAL HIGH (ref 15–41)
Albumin: 3.3 g/dL — ABNORMAL LOW (ref 3.5–5.0)
Alkaline Phosphatase: 66 U/L (ref 38–126)
Anion gap: 7 (ref 5–15)
BUN: 10 mg/dL (ref 8–23)
CO2: 24 mmol/L (ref 22–32)
Calcium: 8.3 mg/dL — ABNORMAL LOW (ref 8.9–10.3)
Chloride: 106 mmol/L (ref 98–111)
Creatinine, Ser: 0.75 mg/dL (ref 0.44–1.00)
GFR, Estimated: >60 mL/min (ref >60)
Glucose, Bld: 113 mg/dL — ABNORMAL HIGH (ref 70–99)
Potassium: 3.7 mmol/L (ref 3.5–5.1)
Sodium: 137 mmol/L (ref 135–145)
Total Bilirubin: 0.4 mg/dL (ref 0.3–1.2)
Total Protein: 6.1 g/dL — ABNORMAL LOW (ref 6.5–8.1)

## 2020-09-28 LAB — CBC
HCT: 37 % (ref 36.0–46.0)
Hemoglobin: 11.6 g/dL — ABNORMAL LOW (ref 12.0–15.0)
MCH: 29.7 pg (ref 26.0–34.0)
MCHC: 31.4 g/dL (ref 30.0–36.0)
MCV: 94.9 fL (ref 80.0–100.0)
Platelets: 250 10*3/uL (ref 150–400)
RBC: 3.9 MIL/uL (ref 3.87–5.11)
RDW: 14.1 % (ref 11.5–15.5)
WBC: 11.1 10*3/uL — ABNORMAL HIGH (ref 4.0–10.5)
nRBC: 0 % (ref 0.0–0.2)

## 2020-09-28 LAB — SARS CORONAVIRUS 2 (TAT 6-24 HRS): SARS Coronavirus 2: NEGATIVE

## 2020-09-28 MED ORDER — SUMATRIPTAN SUCCINATE 50 MG PO TABS
100.0000 mg | ORAL_TABLET | ORAL | Status: DC | PRN
Start: 2020-09-28 — End: 2020-10-01

## 2020-09-28 MED ORDER — PRAVASTATIN SODIUM 40 MG PO TABS
40.0000 mg | ORAL_TABLET | Freq: Every day | ORAL | Status: DC
  Administered 2020-09-28 – 2020-09-30 (×3): 40 mg via ORAL
  Filled 2020-09-28 (×2): qty 1

## 2020-09-28 MED ORDER — IOHEXOL 300 MG/ML  SOLN
75.0000 mL | Freq: Once | INTRAMUSCULAR | Status: AC | PRN
  Administered 2020-09-28: 75 mL via INTRAVENOUS

## 2020-09-28 MED ORDER — FAMOTIDINE 20 MG PO TABS
20.0000 mg | ORAL_TABLET | Freq: Every day | ORAL | Status: DC
  Administered 2020-09-28 – 2020-10-01 (×4): 20 mg via ORAL
  Filled 2020-09-28 (×4): qty 1

## 2020-09-28 MED ORDER — FLUTICASONE PROPIONATE 50 MCG/ACT NA SUSP
2.0000 | Freq: Every day | NASAL | Status: DC
  Administered 2020-09-28 – 2020-10-01 (×3): 2 via NASAL
  Filled 2020-09-28 (×2): qty 16

## 2020-09-28 MED ORDER — NITROGLYCERIN 0.4 MG SL SUBL: 0.4000 mg | SUBLINGUAL_TABLET | SUBLINGUAL | Status: DC | PRN

## 2020-09-28 MED ORDER — BENZONATATE 100 MG PO CAPS: 100.0000 mg | ORAL_CAPSULE | Freq: Three times a day (TID) | ORAL | Status: DC | PRN

## 2020-09-28 NOTE — Progress Notes (Signed)
PROGRESS NOTE   Ariel Henderson  MBW:[id number] DOB: 10/25/1945 DOA: 09/27/2020 PCP: Denita Lung, MD   Chief Complaint  Patient presents with  . Abdominal Pain    Brief Admission History:  75 y.o. female, with past medical history of hypertension, hyperlipidemia, GERD, breast cancer in remission, she presents to ED secondary to complaints of abdominal pain, nausea and vomiting, reports pain started in epigastric area at 1 PM, kept progressing, sharp, nonradiating, she did develop nausea and vomiting at 1 AM, unable to tolerate any oral intake, liquids or solids, pain was intolerable, so she came to ED, no coffee-ground emesis, fever, chills, dysuria, polyuria, diarrhea or constipation, she denies any chest pain or shortness of breath. -In ED patient was afebrile, work-up significant for elevated AST at 287, ALT at 193, lipase within normal limits at 19, alk phos within normal limit at 78, total bili within normal limit at 0.4, CT abdomen pelvis and ultrasound significant for acute cholecystitis/choledocholithiasis, [facility] consulted to admit.  Assessment & Plan:   Active Problems:   HTN (hypertension)   Gastroesophageal reflux disease without esophagitis   Acute cholecystitis   Calculus of gallbladder with acute cholecystitis without obstruction   1. Acute cholecystitis / cholelithiasis - Pt tolerating clears. LFTs trending down.  Appreciate surgery consult.  Tentative plan for cholecystitis 09/30/20.  IV morphine for pain.  2. Essential hypertension - resumed home propanolol.  3. Transaminitis - LFTs trending down.   4. Indeterminate 9x8 mm attenuation inferior pole left renal lesion - radiologist recommended nonemergent MRI with and without contrast to exclude small renal neoplasm which should be done outpatient.    DVT prophylaxis: heparin Code Status: full  Family Communication:  Disposition: Home   Status is: Inpatient  Remains inpatient appropriate because:IV  treatments appropriate due to intensity of illness or inability to take PO and Inpatient level of care appropriate due to severity of illness   Dispo: The patient is from: Home              Anticipated d/c is to: Home              Anticipated d/c date is: 3 days              Patient currently is not medically stable to d/c.   Difficult to place patient No  Consultants:   Surgery Arnoldo Morale)  Procedures:   Tentative schedule for lap chole 1/14  Antimicrobials:  Ciprofloxacin 1/21>> Metronidazole 1/21>>   Subjective: Pt reports tolerating clears, pain better controlled after morphine  Objective: Vitals:   09/27/20 1930 09/27/20 2110 09/28/20 0009 09/28/20 0404  BP: 133/73 132/90 128/75 (!) 118/57  Pulse: 65 66 62 (!) 56  Resp: 14 18 18 19   Temp:  98.4 F (36.9 C) 98.3 F (36.8 C) 98.1 F (36.7 C)  TempSrc:   Oral   SpO2: 96% 97% 98% 94%  Weight:      Height:        Intake/Output Summary (Last 24 hours) at 09/28/2020 0937 Last data filed at 09/27/2020 2221 Gross per 24 hour  Intake 340 ml  Output -  Net 340 ml   Filed Weights   09/27/20 1050  Weight: 78.5 kg    Examination:  General exam: Appears calm and comfortable  Respiratory system: Clear to auscultation. Respiratory effort normal. Cardiovascular system: S1 & S2 heard, RRR. No JVD, murmurs, rubs, gallops or clicks. No pedal edema. Gastrointestinal system: Abdomen is nondistended, soft and RUQ TTP.  No organomegaly or masses felt. Normal bowel sounds heard. Central nervous system: Alert and oriented. No focal neurological deficits. Extremities: Symmetric 5 x 5 power. Skin: No rashes, lesions or ulcers Psychiatry: Judgement and insight appear normal. Mood & affect appropriate.   Data Reviewed: I have personally reviewed following labs and imaging studies  CBC: Recent Labs  Lab 09/27/20 1135 09/28/20 0640  WBC 9.5 11.1*  NEUTROABS 7.3  --   HGB 11.8* 11.6*  HCT 37.3 37.0  MCV 94.9 94.9  PLT 244 250     Basic Metabolic Panel: Recent Labs  Lab 09/27/20 1135 09/28/20 0640  NA 138 137  K 3.4* 3.7  CL 110 106  CO2 21* 24  GLUCOSE 120* 113*  BUN 10 10  CREATININE 0.57 0.75  CALCIUM 7.4* 8.3*    GFR: Estimated Creatinine Clearance: 60.2 mL/min (by C-G formula based on SCr of 0.75 mg/dL).  Liver Function Tests: Recent Labs  Lab 09/27/20 1135 09/28/20 0640  AST 287* 105*  ALT 193* 131*  ALKPHOS 78 66  BILITOT 0.4 0.4  PROT 6.4* 6.1*  ALBUMIN 3.4* 3.3*    CBG: No results for input(s): GLUCAP in the last 168 hours.  Recent Results (from the past 240 hour(s))  SARS CORONAVIRUS 2 (TAT 6-24 HRS) Nasopharyngeal Nasopharyngeal Swab     Status: None   Collection Time: 09/27/20  2:58 PM   Specimen: Nasopharyngeal Swab  Result Value Ref Range Status   SARS Coronavirus 2 NEGATIVE NEGATIVE Final    Comment: (NOTE) SARS-CoV-2 target nucleic acids are NOT DETECTED.  The SARS-CoV-2 RNA is generally detectable in upper and lower respiratory specimens during the acute phase of infection. Negative results do not preclude SARS-CoV-2 infection, do not rule out co-infections with other pathogens, and should not be used as the sole basis for treatment or other patient management decisions. Negative results must be combined with clinical observations, patient history, and epidemiological information. The expected result is Negative.  Fact Sheet for Patients: SugarRoll.be  Fact Sheet for Healthcare Providers: https://www.woods-mathews.com/  This test is not yet approved or cleared by the [place] FDA and  has been authorized for detection and/or diagnosis of SARS-CoV-2 by FDA under an Emergency Use Authorization (EUA). This EUA will remain  in effect (meaning this test can be used) for the duration of the COVID-19 declaration under Se ction 564(b)(1) of the Act, 21 U.S.C. section 360bbb-3(b)(1), unless the authorization is terminated  or revoked sooner.  Performed at [facility], [place] [address] [place], [place] [address]      Radiology Studies: CT Abdomen Pelvis W Contrast  Result Date: 09/27/2020 CLINICAL DATA:  Nausea, vomiting, epigastric and centralized abdominal pain, symptoms for 3 days EXAM: CT ABDOMEN AND PELVIS WITH CONTRAST TECHNIQUE: Multidetector CT imaging of the abdomen and pelvis was performed using the standard protocol following bolus administration of intravenous contrast. Sagittal and coronal MPR images reconstructed from axial data set. CONTRAST:  19m OMNIPAQUE IOHEXOL 300 MG/ML SOLN IV. No oral contrast. COMPARISON:  None FINDINGS: Lower chest: Minimal atelectasis at LEFT base. LEFT breast prosthesis noted. Hepatobiliary: Scattered low attenuation lesions within the liver likely cysts, largest lateral segment LEFT lobe 2.2 x 2.1 cm image 22. Mild thickening of gallbladder wall with minimal adjacent edema. Subtle increased attenuation centrally within gallbladder, cannot exclude noncalcified stones. Findings are concerning for cholecystitis. No biliary dilatation. Pancreas: Normal appearance Spleen: Normal a porous Adrenals/Urinary Tract: Adrenal glands and RIGHT kidney normal appearance. Large cyst at upper pole  LEFT kidney 6.1 x 5.4 cm image 30. Additional tiny LEFT renal cysts. Indeterminate lesion at inferior pole LEFT kidney 9 x 8 mm, containing slight wall irregularity, intermediate attenuation, and minimal internal complexity. No urinary tract calcification or dilatation. Bladder and ureters unremarkable. Stomach/Bowel: Normal appendix. Small hiatal hernia. Stomach and bowel loops otherwise normal appearance Vascular/Lymphatic: Atherosclerotic calcifications aorta without aneurysm. No adenopathy Reproductive: Uterus surgically absent.  Ovaries unremarkable. Other: No free air or free fluid.  No hernia. Musculoskeletal: Unremarkable IMPRESSION: Mild thickening of gallbladder wall with minimal  adjacent edema and questionable noncalcified stones, findings which are concerning for cholecystitis, recommend correlation with ultrasound. Multiple hepatic and LEFT renal cysts. Indeterminate 9 x 8 mm intermediate attenuation inferior pole LEFT renal lesion, recommend characterization by non emergent MR imaging with and without contrast to exclude small renal neoplasm. Small hiatal hernia. Aortic Atherosclerosis (ICD10-I70.0). Electronically Signed   By: Lavonia Dana M.D.   On: 09/27/2020 12:54   US Abdomen Limited  Result Date: 09/27/2020 CLINICAL DATA:  Upper abdominal pain EXAM: ULTRASOUND ABDOMEN LIMITED RIGHT UPPER QUADRANT COMPARISON:  CT abdomen and pelvis September 27, 2020 FINDINGS: Gallbladder: Within the gallbladder, there are echogenic foci which move and shadow consistent with cholelithiasis. Largest individual gallstone measures 2.0 cm. The gallbladder wall is thickened and edematous. There is equivocal pericholecystic fluid. Patient is focally tender over the gallbladder. Common bile duct: Diameter: 3 mm. No intrahepatic or extrahepatic biliary duct dilatation. Liver: There is a septated cyst in the right lobe of the liver measuring 2.4 x 1.3 x 1.8 cm. Within normal limits in parenchymal echogenicity. Portal vein is patent on color Doppler imaging with normal direction of blood flow towards the liver. Other: There is a cyst arising from the upper pole the right kidney measuring 5.1 x 4.9 x 5.2 cm. IMPRESSION: 1. Cholelithiasis with thickened, edematous gallbladder wall and slight pericholecystic fluid. Patient is focally tender over the gallbladder. These are findings indicative of acute cholecystitis. 2. Septated cyst right lobe of liver measuring 2.4 x 1.3 x 1.8 cm. No other focal liver lesions evident. 3. Cyst arising from upper pole right kidney measuring 5.1 x 4.9 x 5.2 cm. Electronically Signed   By: Lowella Grip III M.D.   On: 09/27/2020 14:31   Scheduled Meds: . buPROPion  150 mg  Oral Daily  . citalopram  40 mg Oral Daily  . famotidine  20 mg Oral Daily  . fluticasone  2 spray Each Nare Daily  . heparin  5,000 Units Subcutaneous Q8H  . levothyroxine  88 mcg Oral Daily  . metroNIDAZOLE  500 mg Oral Q8H  . pantoprazole  40 mg Oral Daily  . pravastatin  40 mg Oral q1800  . propranolol  40 mg Oral Daily   Continuous Infusions: . sodium chloride 50 mL/hr at 09/27/20 1630  . cefTRIAXone (ROCEPHIN)  IV Stopped (09/27/20 1803)     LOS: 1 day   Time spent: 36 mins   Clanford Wynetta Emery, MD How to contact the [facility] Attending or Consulting provider [place] or covering provider during after hours [place], for this patient?  1. Check the care team in [facility] and look for a) attending/consulting [facility] provider listed and b) the [facility] team listed 2. Log into [email or web address] and use [place]'s universal password to access. If you do not have the password, please contact the hospital operator. 3. Locate the [facility] provider you are looking for under [facility] and page to a number that you can  be directly reached. 4. If you still have difficulty reaching the provider, please page the [facility] (Director on Call) for the Hospitalists listed on amion for assistance.  09/28/2020, 9:37 AM

## 2020-09-28 NOTE — Consult Note (Signed)
Reason for Consult: Right upper quadrant abdominal pain, cholelithiasis Referring Physician: Dr. Tasia Catchings is an 75 y.o. female.  HPI: Patient is a 75 year old white female who presented to the emergency room with worsening right upper quadrant abdominal pain.  CT scan of the abdomen revealed cholelithiasis.  Ultrasound the gallbladder confirmed cholelithiasis with a thickened gallbladder wall.  Common bile duct was within normal limits.  Patient states she has had intermittent episodes of right upper quadrant abdominal pain over the past few months.  It has been varied in nature and resolve spontaneously.  This episode was her worst.  She had right upper quadrant abdominal pain with nausea and vomiting.  She denies any fever, chills, or jaundice.  Past Medical History:  Diagnosis Date  . Breast cancer ([place]) 12/29/12 bx   left breast ductal,high grade ductal ca in situ w/asoc comedo necrosis  . Cancer ([place])    cervical per patient  . Chronic anxiety   . Complication of anesthesia    "slow to wake up"  . Dyslipidemia   . Fibrocystic breast disease   . GERD (gastroesophageal reflux disease)   . History of recurrent UTIs   . Hypertension   . Hypothyroidism   . Major depression   . Melanoma ([place])    per patient on 02/01/13; located on back; dx in 37s  . Migraine headache   . Multinodular goiter   . Peripheral vascular disease ([facility])    varicose veins  . Pneumonia    hx  . Seasonal allergies   . Thyroid cancer ([place])    per patient on 02/01/2013; dx in 103s    Past Surgical History:  Procedure Laterality Date  . ABDOMINAL HYSTERECTOMY    . BREAST DUCTAL SYSTEM EXCISION Left 12/29/2012   Procedure: EXCISION DUCTAL SYSTEM BREAST;  Surgeon: Haywood Lasso, MD;  Location: [facility];  Service: General;  Laterality: Left;  . BREAST LUMPECTOMY     left   x 3, right x 2  . BREAST LUMPECTOMY Left 12/2009   lower inner=fibrocystic changes/sclerosing adenosis  . BREAST  RECONSTRUCTION WITH PLACEMENT OF TISSUE EXPANDER AND FLEX HD (ACELLULAR HYDRATED DERMIS) Left 02/09/2013   Procedure: LEFT BREAST RECONSTRUCTION WITH PLACEMENT OF TISSUE EXPANDER ;  Surgeon: Crissie Reese, MD;  Location: [facility];  Service: Plastics;  Laterality: Left;  . EYE SURGERY Bilateral    cataract extraction with IOL  . LEFT HEART CATHETERIZATION WITH CORONARY ANGIOGRAM N/A 10/10/2014   Procedure: LEFT HEART CATHETERIZATION WITH CORONARY ANGIOGRAM;  Surgeon: Peter M Martinique, MD;  Location: [facility] CATH LAB;  Service: Cardiovascular;  Laterality: N/A;  . MASTECTOMY W/ SENTINEL NODE BIOPSY Left 02/09/2013   Procedure: TOTAL MASTECTOMY WITH SENTINEL LYMPH NODE BIOPSY;  Surgeon: Haywood Lasso, MD;  Location: [facility];  Service: General;  Laterality: Left;  . THYROIDECTOMY  1991  . TOTAL MASTECTOMY Left 02/09/2013   WITH TISSUE EXPANDER & BIOPSY  . WRIST FRACTURE SURGERY      Family History  Problem Relation Age of Onset  . Colon cancer Brother 44  . Prostate cancer Brother 39  . Lung cancer Father 4       smoker  . Ovarian cancer Sister 108  . Brain cancer Sister 75  . Prostate cancer Brother 24  . Prostate cancer Brother 85  . Prostate cancer Brother 3  . Prostate cancer Brother 42  . Cancer Brother 33       metastatic; unknown primary    Social History:  reports that she has never smoked. She has never used smokeless tobacco. She reports that she does not drink alcohol and does not use drugs.  Allergies:  Allergies  Allergen Reactions  . Aspirin Nausea Only and Other (See Comments)    Non coated CAN TAKE 81 MG     Medications: I have reviewed the patient's current medications.  Results for orders placed or performed during the hospital encounter of 09/27/20 (from the past 48 hour(s))  Urinalysis, Routine w reflex microscopic Urine, Clean Catch     Status: Abnormal   Collection Time: 09/27/20 11:23 AM  Result Value Ref Range   Color, Urine YELLOW YELLOW   APPearance CLEAR CLEAR    Specific Gravity, Urine 1.020 1.005 - 1.030   pH 6.0 5.0 - 8.0   Glucose, UA NEGATIVE NEGATIVE mg/dL   Hgb urine dipstick LARGE (A) NEGATIVE   Bilirubin Urine NEGATIVE NEGATIVE   Ketones, ur 20 (A) NEGATIVE mg/dL   Protein, ur 30 (A) NEGATIVE mg/dL   Nitrite NEGATIVE NEGATIVE   Leukocytes,Ua NEGATIVE NEGATIVE   RBC / HPF >50 (H) 0 - 5 RBC/hpf   WBC, UA 0-5 0 - 5 WBC/hpf   Bacteria, UA NONE SEEN NONE SEEN   Squamous Epithelial / LPF 0-5 0 - 5   Mucus PRESENT     Comment: Performed at [facility], [address], [place], [place] [address]  CBC with Differential     Status: Abnormal   Collection Time: 09/27/20 11:35 AM  Result Value Ref Range   WBC 9.5 4.0 - 10.5 K/uL   RBC 3.93 3.87 - 5.11 MIL/uL   Hemoglobin 11.8 (L) 12.0 - 15.0 g/dL   HCT 37.3 36.0 - 46.0 %   MCV 94.9 80.0 - 100.0 fL   MCH 30.0 26.0 - 34.0 pg   MCHC 31.6 30.0 - 36.0 g/dL   RDW 13.6 11.5 - 15.5 %   Platelets 244 150 - 400 K/uL   nRBC 0.0 0.0 - 0.2 %   Neutrophils Relative % 77 %   Neutro Abs 7.3 1.7 - 7.7 K/uL   Lymphocytes Relative 15 %   Lymphs Abs 1.4 0.7 - 4.0 K/uL   Monocytes Relative 5 %   Monocytes Absolute 0.5 0.1 - 1.0 K/uL   Eosinophils Relative 0 %   Eosinophils Absolute 0.0 0.0 - 0.5 K/uL   Basophils Relative 1 %   Basophils Absolute 0.1 0.0 - 0.1 K/uL   Immature Granulocytes 2 %   Abs Immature Granulocytes 0.14 (H) 0.00 - 0.07 K/uL    Comment: Performed at [facility], [address] [place], [place] [address]  Comprehensive metabolic panel     Status: Abnormal   Collection Time: 09/27/20 11:35 AM  Result Value Ref Range   Sodium 138 135 - 145 mmol/L   Potassium 3.4 (L) 3.5 - 5.1 mmol/L   Chloride 110 98 - 111 mmol/L   CO2 21 (L) 22 - 32 mmol/L   Glucose, Bld 120 (H) 70 - 99 mg/dL    Comment: Glucose reference range applies only to samples taken after fasting for at least 8 hours.   BUN 10 8 - 23 mg/dL   Creatinine, Ser 0.57 0.44 - 1.00 mg/dL   Calcium 7.4 (L) 8.9 - 10.3 mg/dL    Total Protein 6.4 (L) 6.5 - 8.1 g/dL   Albumin 3.4 (L) 3.5 - 5.0 g/dL   AST 287 (H) 15 - 41 U/L   ALT 193 (H) 0 - 44 U/L  Alkaline Phosphatase 78 38 - 126 U/L   Total Bilirubin 0.4 0.3 - 1.2 mg/dL   GFR, Estimated >60 >60 mL/min    Comment: (NOTE) Calculated using the CKD-EPI Creatinine Equation (2021)    Anion gap 7 5 - 15    Comment: Performed at [facility], [address], [place], [place] [address]  Lipase, blood     Status: None   Collection Time: 09/27/20 11:35 AM  Result Value Ref Range   Lipase 19 11 - 51 U/L    Comment: Performed at [facility], [address] [place], [place] [address]  SARS CORONAVIRUS 2 (TAT 6-24 HRS) Nasopharyngeal Nasopharyngeal Swab     Status: None   Collection Time: 09/27/20  2:58 PM   Specimen: Nasopharyngeal Swab  Result Value Ref Range   SARS Coronavirus 2 NEGATIVE NEGATIVE    Comment: (NOTE) SARS-CoV-2 target nucleic acids are NOT DETECTED.  The SARS-CoV-2 RNA is generally detectable in upper and lower respiratory specimens during the acute phase of infection. Negative results do not preclude SARS-CoV-2 infection, do not rule out co-infections with other pathogens, and should not be used as the sole basis for treatment or other patient management decisions. Negative results must be combined with clinical observations, patient history, and epidemiological information. The expected result is Negative.  Fact Sheet for Patients: SugarRoll.be  Fact Sheet for Healthcare Providers: https://www.woods-mathews.com/  This test is not yet approved or cleared by the [place] FDA and  has been authorized for detection and/or diagnosis of SARS-CoV-2 by FDA under an Emergency Use Authorization (EUA). This EUA will remain  in effect (meaning this test can be used) for the duration of the COVID-19 declaration under Se ction 564(b)(1) of the Act, 21 U.S.C. section 360bbb-3(b)(1), unless the authorization  is terminated or revoked sooner.  Performed at [facility], [place] [address] [place], [place] [address]   Comprehensive metabolic panel     Status: Abnormal   Collection Time: 09/28/20  6:40 AM  Result Value Ref Range   Sodium 137 135 - 145 mmol/L   Potassium 3.7 3.5 - 5.1 mmol/L   Chloride 106 98 - 111 mmol/L   CO2 24 22 - 32 mmol/L   Glucose, Bld 113 (H) 70 - 99 mg/dL    Comment: Glucose reference range applies only to samples taken after fasting for at least 8 hours.   BUN 10 8 - 23 mg/dL   Creatinine, Ser 0.75 0.44 - 1.00 mg/dL   Calcium 8.3 (L) 8.9 - 10.3 mg/dL   Total Protein 6.1 (L) 6.5 - 8.1 g/dL   Albumin 3.3 (L) 3.5 - 5.0 g/dL   AST 105 (H) 15 - 41 U/L   ALT 131 (H) 0 - 44 U/L   Alkaline Phosphatase 66 38 - 126 U/L   Total Bilirubin 0.4 0.3 - 1.2 mg/dL   GFR, Estimated >60 >60 mL/min    Comment: (NOTE) Calculated using the CKD-EPI Creatinine Equation (2021)    Anion gap 7 5 - 15    Comment: Performed at [facility], [address] [place], [place] [address]  CBC     Status: Abnormal   Collection Time: 09/28/20  6:40 AM  Result Value Ref Range   WBC 11.1 (H) 4.0 - 10.5 K/uL   RBC 3.90 3.87 - 5.11 MIL/uL   Hemoglobin 11.6 (L) 12.0 - 15.0 g/dL   HCT 37.0 36.0 - 46.0 %   MCV 94.9 80.0 - 100.0 fL   MCH 29.7  26.0 - 34.0 pg   MCHC 31.4 30.0 - 36.0 g/dL   RDW 14.1 11.5 - 15.5 %   Platelets 250 150 - 400 K/uL   nRBC 0.0 0.0 - 0.2 %    Comment: Performed at [facility], [address] [place], [place] [address]    CT Abdomen Pelvis W Contrast  Result Date: 09/27/2020 CLINICAL DATA:  Nausea, vomiting, epigastric and centralized abdominal pain, symptoms for 3 days EXAM: CT ABDOMEN AND PELVIS WITH CONTRAST TECHNIQUE: Multidetector CT imaging of the abdomen and pelvis was performed using the standard protocol following bolus administration of intravenous contrast. Sagittal and coronal MPR images reconstructed from axial data set. CONTRAST:  16mL OMNIPAQUE IOHEXOL  300 MG/ML SOLN IV. No oral contrast. COMPARISON:  None FINDINGS: Lower chest: Minimal atelectasis at LEFT base. LEFT breast prosthesis noted. Hepatobiliary: Scattered low attenuation lesions within the liver likely cysts, largest lateral segment LEFT lobe 2.2 x 2.1 cm image 22. Mild thickening of gallbladder wall with minimal adjacent edema. Subtle increased attenuation centrally within gallbladder, cannot exclude noncalcified stones. Findings are concerning for cholecystitis. No biliary dilatation. Pancreas: Normal appearance Spleen: Normal a porous Adrenals/Urinary Tract: Adrenal glands and RIGHT kidney normal appearance. Large cyst at upper pole LEFT kidney 6.1 x 5.4 cm image 30. Additional tiny LEFT renal cysts. Indeterminate lesion at inferior pole LEFT kidney 9 x 8 mm, containing slight wall irregularity, intermediate attenuation, and minimal internal complexity. No urinary tract calcification or dilatation. Bladder and ureters unremarkable. Stomach/Bowel: Normal appendix. Small hiatal hernia. Stomach and bowel loops otherwise normal appearance Vascular/Lymphatic: Atherosclerotic calcifications aorta without aneurysm. No adenopathy Reproductive: Uterus surgically absent.  Ovaries unremarkable. Other: No free air or free fluid.  No hernia. Musculoskeletal: Unremarkable IMPRESSION: Mild thickening of gallbladder wall with minimal adjacent edema and questionable noncalcified stones, findings which are concerning for cholecystitis, recommend correlation with ultrasound. Multiple hepatic and LEFT renal cysts. Indeterminate 9 x 8 mm intermediate attenuation inferior pole LEFT renal lesion, recommend characterization by non emergent MR imaging with and without contrast to exclude small renal neoplasm. Small hiatal hernia. Aortic Atherosclerosis (ICD10-I70.0). Electronically Signed   By: Lavonia Dana M.D.   On: 09/27/2020 12:54   US Abdomen Limited  Result Date: 09/27/2020 CLINICAL DATA:  Upper abdominal pain EXAM:  ULTRASOUND ABDOMEN LIMITED RIGHT UPPER QUADRANT COMPARISON:  CT abdomen and pelvis September 27, 2020 FINDINGS: Gallbladder: Within the gallbladder, there are echogenic foci which move and shadow consistent with cholelithiasis. Largest individual gallstone measures 2.0 cm. The gallbladder wall is thickened and edematous. There is equivocal pericholecystic fluid. Patient is focally tender over the gallbladder. Common bile duct: Diameter: 3 mm. No intrahepatic or extrahepatic biliary duct dilatation. Liver: There is a septated cyst in the right lobe of the liver measuring 2.4 x 1.3 x 1.8 cm. Within normal limits in parenchymal echogenicity. Portal vein is patent on color Doppler imaging with normal direction of blood flow towards the liver. Other: There is a cyst arising from the upper pole the right kidney measuring 5.1 x 4.9 x 5.2 cm. IMPRESSION: 1. Cholelithiasis with thickened, edematous gallbladder wall and slight pericholecystic fluid. Patient is focally tender over the gallbladder. These are findings indicative of acute cholecystitis. 2. Septated cyst right lobe of liver measuring 2.4 x 1.3 x 1.8 cm. No other focal liver lesions evident. 3. Cyst arising from upper pole right kidney measuring 5.1 x 4.9 x 5.2 cm. Electronically Signed   By: Lowella Grip III M.D.   On: 09/27/2020 14:31  ROS:  Pertinent items are noted in HPI.  Blood pressure (!) 118/57, pulse (!) 56, temperature 98.1 F (36.7 C), resp. rate 19, height 5\' 3"  (1.6 m), weight 78.5 kg, SpO2 94 %. Physical Exam: Pleasant white female no acute distress Head is normocephalic, atraumatic Eyes are without scleral icterus Lungs are clear to auscultation with equal breath sounds bilaterally Heart examination reveals regular rate and rhythm without S3, S4, murmurs Abdomen soft with mild tenderness to palpation in the right upper quadrant.  No splenomegaly or rigidity is noted.  CT scan results reviewed  Assessment/Plan: Impression:  Acute cholecystitis, cholelithiasis.  Liver enzyme tests are improved today.  She also has a cyst in the right kidney that will need to be addressed as an outpatient. Plan: Continue IV antibiotics.  Patient should continue clear liquid diet for now.  Patient is temporarily scheduled for laparoscopic cholecystectomy on September 30, 2020.  Aviva Signs 09/28/2020, 8:45 AM

## 2020-09-29 ENCOUNTER — Inpatient Hospital Stay (HOSPITAL_COMMUNITY): Payer: Medicare Other

## 2020-09-29 DIAGNOSIS — I10 Essential (primary) hypertension: Secondary | ICD-10-CM

## 2020-09-29 DIAGNOSIS — Z01818 Encounter for other preprocedural examination: Secondary | ICD-10-CM | POA: Diagnosis not present

## 2020-09-29 LAB — ECHOCARDIOGRAM COMPLETE
Area-P 1/2: 4.06 cm2
Height: 63 in
S' Lateral: 2.5 cm
Weight: 2768 oz

## 2020-09-29 LAB — COMPREHENSIVE METABOLIC PANEL
ALT: 82 U/L — ABNORMAL HIGH (ref 0–44)
AST: 43 U/L — ABNORMAL HIGH (ref 15–41)
Albumin: 3.2 g/dL — ABNORMAL LOW (ref 3.5–5.0)
Alkaline Phosphatase: 57 U/L (ref 38–126)
Anion gap: 7 (ref 5–15)
BUN: 10 mg/dL (ref 8–23)
CO2: 24 mmol/L (ref 22–32)
Calcium: 8.4 mg/dL — ABNORMAL LOW (ref 8.9–10.3)
Chloride: 106 mmol/L (ref 98–111)
Creatinine, Ser: 0.81 mg/dL (ref 0.44–1.00)
GFR, Estimated: >60 mL/min (ref >60)
Glucose, Bld: 96 mg/dL (ref 70–99)
Potassium: 4 mmol/L (ref 3.5–5.1)
Sodium: 137 mmol/L (ref 135–145)
Total Bilirubin: 0.5 mg/dL (ref 0.3–1.2)
Total Protein: 5.9 g/dL — ABNORMAL LOW (ref 6.5–8.1)

## 2020-09-29 LAB — CBC
HCT: 36.3 % (ref 36.0–46.0)
Hemoglobin: 11 g/dL — ABNORMAL LOW (ref 12.0–15.0)
MCH: 29.4 pg (ref 26.0–34.0)
MCHC: 30.3 g/dL (ref 30.0–36.0)
MCV: 97.1 fL (ref 80.0–100.0)
Platelets: 208 10*3/uL (ref 150–400)
RBC: 3.74 MIL/uL — ABNORMAL LOW (ref 3.87–5.11)
RDW: 13.9 % (ref 11.5–15.5)
WBC: 9.6 10*3/uL (ref 4.0–10.5)
nRBC: 0 % (ref 0.0–0.2)

## 2020-09-29 LAB — MAGNESIUM: Magnesium: 1.9 mg/dL (ref 1.7–2.4)

## 2020-09-29 LAB — SURGICAL PCR SCREEN
MRSA, PCR: NEGATIVE
Staphylococcus aureus: NEGATIVE

## 2020-09-29 MED ORDER — CHLORHEXIDINE GLUCONATE CLOTH 2 % EX PADS
6.0000 | MEDICATED_PAD | Freq: Once | CUTANEOUS | Status: AC
  Administered 2020-09-30: 6 via TOPICAL

## 2020-09-29 MED ORDER — CHLORHEXIDINE GLUCONATE CLOTH 2 % EX PADS
6.0000 | MEDICATED_PAD | Freq: Once | CUTANEOUS | Status: AC
  Administered 2020-09-29: 6 via TOPICAL

## 2020-09-29 NOTE — Progress Notes (Signed)
*  PRELIMINARY RESULTS* Echocardiogram 2D Echocardiogram has been performed.  Ariel Henderson 09/29/2020, 9:40 AM

## 2020-09-29 NOTE — H&P (View-Only) (Signed)
Subjective: Patient states her abdominal pain is gone.  Objective: Vital signs in last 24 hours: Temp:  [97.4 F (36.3 C)-98.2 F (36.8 C)] 97.4 F (36.3 C) (01/23 0636) Pulse Rate:  [54-68] 54 (01/23 0636) Resp:  [16-18] 16 (01/23 0636) BP: (94-134)/(63-76) 131/63 (01/23 0636) SpO2:  [94 %-97 %] 94 % (01/23 0636) Last BM Date: 09/27/20  Intake/Output from previous day: 01/22 0701 - 01/23 0700 In: 2192.9 [P.O.:1040; I.V.:1114.1; IV Piggyback:38.8] Out: -  Intake/Output this shift: No intake/output data recorded.  General appearance: alert, cooperative and no distress GI: soft, non-tender; bowel sounds normal; no masses,  no organomegaly  Lab Results:  Recent Labs    09/27/20 1135 09/28/20 0640  WBC 9.5 11.1*  HGB 11.8* 11.6*  HCT 37.3 37.0  PLT 244 250   BMET Recent Labs    09/27/20 1135 09/28/20 0640  NA 138 137  K 3.4* 3.7  CL 110 106  CO2 21* 24  GLUCOSE 120* 113*  BUN 10 10  CREATININE 0.57 0.75  CALCIUM 7.4* 8.3*   PT/INR No results for input(s): LABPROT, INR in the last 72 hours.  Studies/Results: CT CHEST W CONTRAST  Result Date: 09/28/2020 CLINICAL DATA:  Abnormal chest radiograph, pre cardiac catheterization EXAM: CT CHEST WITH CONTRAST TECHNIQUE: Multidetector CT imaging of the chest was performed during intravenous contrast administration. CONTRAST:  26mL OMNIPAQUE IOHEXOL 300 MG/ML  SOLN COMPARISON:  09/28/2020 FINDINGS: Cardiovascular: Heart is normal in size. No pericardial effusion. Leftward cardiomediastinal shift. Mild thinning of the left ventricular apex (series 2/image 101) with associated narrow outpouching at the apex which may reflect a small apical ventricular aneurysm (coronal image 34). No evidence of thoracic aortic aneurysm. No significant coronary atherosclerosis on this non gated study. Mediastinum/Nodes: No suspicious mediastinal lymphadenopathy. Status post left axillary lymph node dissection. Status post left thyroidectomy.  Right substernal goiter (series 2/image 30), accounting for the abnormality along the right paratracheal stripe on chest radiograph, benign. Lungs/Pleura: Evaluation of the lung parenchyma is constrained by respiratory motion. Volume loss with linear scarring/atelectasis in the left lower lobe. Mild lingular scarring/atelectasis. 4 mm subpleural nodule in the posterior right upper lobe (series 4/image 30), likely benign. No suspicious pulmonary nodules. Mild elevation of the right hemidiaphragm. Associated right basilar atelectasis. No pleural effusion or pneumothorax. Upper Abdomen: Visualized upper abdomen is notable for a moderate hiatal hernia, hepatic cysts measuring up to 2.3 cm (series 2/image 120), and mild gallbladder wall edema (series 2/image 134). Musculoskeletal: Status post left mastectomy with reconstruction. No focal osseous lesions. IMPRESSION: Radiographic abnormality corresponds to right substernal goiter in this patient status post left thyroidectomy, benign. 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 Cardiology consultation is suggested. No significant coronary atherosclerosis on this non gated study. 4 mm nodule in the posterior right upper lobe, likely benign. This is not considered suspicious for metastatic disease. While Fleischner Society guidelines do not apply in the setting of known malignancy, a single follow-up CT chest can be considered in 1 year if clinically warranted. Electronically Signed   By: Julian Hy M.D.   On: 09/28/2020 12:24   CT Abdomen Pelvis W Contrast  Result Date: 09/27/2020 CLINICAL DATA:  Nausea, vomiting, epigastric and centralized abdominal pain, symptoms for 3 days EXAM: CT ABDOMEN AND PELVIS WITH CONTRAST TECHNIQUE: Multidetector CT imaging of the abdomen and pelvis was performed using the standard protocol following bolus administration of intravenous contrast. Sagittal and coronal MPR images reconstructed  from axial data set. CONTRAST:  127mL OMNIPAQUE IOHEXOL 300 MG/ML SOLN IV. No oral contrast. COMPARISON:  None FINDINGS: Lower chest: Minimal atelectasis at LEFT base. LEFT breast prosthesis noted. Hepatobiliary: Scattered low attenuation lesions within the liver likely cysts, largest lateral segment LEFT lobe 2.2 x 2.1 cm image 22. Mild thickening of gallbladder wall with minimal adjacent edema. Subtle increased attenuation centrally within gallbladder, cannot exclude noncalcified stones. Findings are concerning for cholecystitis. No biliary dilatation. Pancreas: Normal appearance Spleen: Normal a porous Adrenals/Urinary Tract: Adrenal glands and RIGHT kidney normal appearance. Large cyst at upper pole LEFT kidney 6.1 x 5.4 cm image 30. Additional tiny LEFT renal cysts. Indeterminate lesion at inferior pole LEFT kidney 9 x 8 mm, containing slight wall irregularity, intermediate attenuation, and minimal internal complexity. No urinary tract calcification or dilatation. Bladder and ureters unremarkable. Stomach/Bowel: Normal appendix. Small hiatal hernia. Stomach and bowel loops otherwise normal appearance Vascular/Lymphatic: Atherosclerotic calcifications aorta without aneurysm. No adenopathy Reproductive: Uterus surgically absent.  Ovaries unremarkable. Other: No free air or free fluid.  No hernia. Musculoskeletal: Unremarkable IMPRESSION: Mild thickening of gallbladder wall with minimal adjacent edema and questionable noncalcified stones, findings which are concerning for cholecystitis, recommend correlation with ultrasound. Multiple hepatic and LEFT renal cysts. Indeterminate 9 x 8 mm intermediate attenuation inferior pole LEFT renal lesion, recommend characterization by non emergent MR imaging with and without contrast to exclude small renal neoplasm. Small hiatal hernia. Aortic Atherosclerosis (ICD10-I70.0). Electronically Signed   By: Lavonia Dana M.D.   On: 09/27/2020 12:54   US Abdomen Limited  Result  Date: 09/27/2020 CLINICAL DATA:  Upper abdominal pain EXAM: ULTRASOUND ABDOMEN LIMITED RIGHT UPPER QUADRANT COMPARISON:  CT abdomen and pelvis September 27, 2020 FINDINGS: Gallbladder: Within the gallbladder, there are echogenic foci which move and shadow consistent with cholelithiasis. Largest individual gallstone measures 2.0 cm. The gallbladder wall is thickened and edematous. There is equivocal pericholecystic fluid. Patient is focally tender over the gallbladder. Common bile duct: Diameter: 3 mm. No intrahepatic or extrahepatic biliary duct dilatation. Liver: There is a septated cyst in the right lobe of the liver measuring 2.4 x 1.3 x 1.8 cm. Within normal limits in parenchymal echogenicity. Portal vein is patent on color Doppler imaging with normal direction of blood flow towards the liver. Other: There is a cyst arising from the upper pole the right kidney measuring 5.1 x 4.9 x 5.2 cm. IMPRESSION: 1. Cholelithiasis with thickened, edematous gallbladder wall and slight pericholecystic fluid. Patient is focally tender over the gallbladder. These are findings indicative of acute cholecystitis. 2. Septated cyst right lobe of liver measuring 2.4 x 1.3 x 1.8 cm. No other focal liver lesions evident. 3. Cyst arising from upper pole right kidney measuring 5.1 x 4.9 x 5.2 cm. Electronically Signed   By: Lowella Grip III M.D.   On: 09/27/2020 14:31   DG Chest Port 1 View  Result Date: 09/28/2020 CLINICAL DATA:  Preoperative evaluation prior to cholecystectomy. EXAM: PORTABLE CHEST 1 VIEW COMPARISON:  Chest radiograph 10/03/2014 FINDINGS: There is a new oval density within the right paratracheal location. Stable cardiac contours. Elevation right hemidiaphragm. No large area pulmonary consolidation. No pleural effusion or pneumothorax. Hazy density projecting over the left hemithorax compatible with overlying implant. IMPRESSION: New oval density within the right paratracheal location which is nonspecific and  potentially may represent thyroid tissue. Mass not entirely excluded. Recommend further evaluation with chest CT. Otherwise no acute cardiopulmonary process. These results will be called to the ordering clinician or representative by the Radiologist Assistant, and communication  documented in the PACS or [employer]. Electronically Signed   By: Lovey Newcomer M.D.   On: 09/28/2020 10:33    Anti-infectives: Anti-infectives (From admission, onward)   Start     Dose/Rate Route Frequency Ordered Stop   09/27/20 1545  cefTRIAXone (ROCEPHIN) 2 g in sodium chloride 0.9 % 100 mL IVPB        2 g 200 mL/hr over 30 Minutes Intravenous Every 24 hours 09/27/20 1543     09/27/20 1545  metroNIDAZOLE (FLAGYL) tablet 500 mg        500 mg Oral Every 8 hours 09/27/20 1543     09/27/20 1530  ciprofloxacin (CIPRO) IVPB 400 mg        400 mg 200 mL/hr over 60 Minutes Intravenous  Once 09/27/20 1529 09/27/20 1732      Assessment/Plan: Impression: Today's labs are pending.  Patient had an abnormal CT scan of the chest.  Some ventricular apical thinning is noted.  Patient did have a negative stress test in 2018.  2D echo has been ordered.  Cardiology consultation is pending.  We will temporarily schedule the patient for laparoscopic cholecystectomy tomorrow.  The risks and benefits of the procedure including bleeding, infection, hepatobiliary injury, and the possibility of an open procedure were fully explained to the patient, who gave informed consent.  Will advance to heart healthy diet for today.  LOS: 2 days    Aviva Signs 09/29/2020

## 2020-09-29 NOTE — Progress Notes (Signed)
Subjective: Patient states her abdominal pain is gone.  Objective: Vital signs in last 24 hours: Temp:  [97.4 F (36.3 C)-98.2 F (36.8 C)] 97.4 F (36.3 C) (01/23 0636) Pulse Rate:  [54-68] 54 (01/23 0636) Resp:  [16-18] 16 (01/23 0636) BP: (94-134)/(63-76) 131/63 (01/23 0636) SpO2:  [94 %-97 %] 94 % (01/23 0636) Last BM Date: 09/27/20  Intake/Output from previous day: 01/22 0701 - 01/23 0700 In: 2192.9 [P.O.:1040; I.V.:1114.1; IV Piggyback:38.8] Out: -  Intake/Output this shift: No intake/output data recorded.  General appearance: alert, cooperative and no distress GI: soft, non-tender; bowel sounds normal; no masses,  no organomegaly  Lab Results:  Recent Labs    09/27/20 1135 09/28/20 0640  WBC 9.5 11.1*  HGB 11.8* 11.6*  HCT 37.3 37.0  PLT 244 250   BMET Recent Labs    09/27/20 1135 09/28/20 0640  NA 138 137  K 3.4* 3.7  CL 110 106  CO2 21* 24  GLUCOSE 120* 113*  BUN 10 10  CREATININE 0.57 0.75  CALCIUM 7.4* 8.3*   PT/INR No results for input(s): LABPROT, INR in the last 72 hours.  Studies/Results: CT CHEST W CONTRAST  Result Date: 09/28/2020 CLINICAL DATA:  Abnormal chest radiograph, pre cardiac catheterization EXAM: CT CHEST WITH CONTRAST TECHNIQUE: Multidetector CT imaging of the chest was performed during intravenous contrast administration. CONTRAST:  79mL OMNIPAQUE IOHEXOL 300 MG/ML  SOLN COMPARISON:  09/28/2020 FINDINGS: Cardiovascular: Heart is normal in size. No pericardial effusion. Leftward cardiomediastinal shift. Mild thinning of the left ventricular apex (series 2/image 101) with associated narrow outpouching at the apex which may reflect a small apical ventricular aneurysm (coronal image 34). No evidence of thoracic aortic aneurysm. No significant coronary atherosclerosis on this non gated study. Mediastinum/Nodes: No suspicious mediastinal lymphadenopathy. Status post left axillary lymph node dissection. Status post left thyroidectomy.  Right substernal goiter (series 2/image 30), accounting for the abnormality along the right paratracheal stripe on chest radiograph, benign. Lungs/Pleura: Evaluation of the lung parenchyma is constrained by respiratory motion. Volume loss with linear scarring/atelectasis in the left lower lobe. Mild lingular scarring/atelectasis. 4 mm subpleural nodule in the posterior right upper lobe (series 4/image 30), likely benign. No suspicious pulmonary nodules. Mild elevation of the right hemidiaphragm. Associated right basilar atelectasis. No pleural effusion or pneumothorax. Upper Abdomen: Visualized upper abdomen is notable for a moderate hiatal hernia, hepatic cysts measuring up to 2.3 cm (series 2/image 120), and mild gallbladder wall edema (series 2/image 134). Musculoskeletal: Status post left mastectomy with reconstruction. No focal osseous lesions. IMPRESSION: Radiographic abnormality corresponds to right substernal goiter in this patient status post left thyroidectomy, benign. 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 Cardiology consultation is suggested. No significant coronary atherosclerosis on this non gated study. 4 mm nodule in the posterior right upper lobe, likely benign. This is not considered suspicious for metastatic disease. While Fleischner Society guidelines do not apply in the setting of known malignancy, a single follow-up CT chest can be considered in 1 year if clinically warranted. Electronically Signed   By: Julian Hy M.D.   On: 09/28/2020 12:24   CT Abdomen Pelvis W Contrast  Result Date: 09/27/2020 CLINICAL DATA:  Nausea, vomiting, epigastric and centralized abdominal pain, symptoms for 3 days EXAM: CT ABDOMEN AND PELVIS WITH CONTRAST TECHNIQUE: Multidetector CT imaging of the abdomen and pelvis was performed using the standard protocol following bolus administration of intravenous contrast. Sagittal and coronal MPR images reconstructed  from axial data set. CONTRAST:  127mL OMNIPAQUE IOHEXOL 300 MG/ML SOLN IV. No oral contrast. COMPARISON:  None FINDINGS: Lower chest: Minimal atelectasis at LEFT base. LEFT breast prosthesis noted. Hepatobiliary: Scattered low attenuation lesions within the liver likely cysts, largest lateral segment LEFT lobe 2.2 x 2.1 cm image 22. Mild thickening of gallbladder wall with minimal adjacent edema. Subtle increased attenuation centrally within gallbladder, cannot exclude noncalcified stones. Findings are concerning for cholecystitis. No biliary dilatation. Pancreas: Normal appearance Spleen: Normal a porous Adrenals/Urinary Tract: Adrenal glands and RIGHT kidney normal appearance. Large cyst at upper pole LEFT kidney 6.1 x 5.4 cm image 30. Additional tiny LEFT renal cysts. Indeterminate lesion at inferior pole LEFT kidney 9 x 8 mm, containing slight wall irregularity, intermediate attenuation, and minimal internal complexity. No urinary tract calcification or dilatation. Bladder and ureters unremarkable. Stomach/Bowel: Normal appendix. Small hiatal hernia. Stomach and bowel loops otherwise normal appearance Vascular/Lymphatic: Atherosclerotic calcifications aorta without aneurysm. No adenopathy Reproductive: Uterus surgically absent.  Ovaries unremarkable. Other: No free air or free fluid.  No hernia. Musculoskeletal: Unremarkable IMPRESSION: Mild thickening of gallbladder wall with minimal adjacent edema and questionable noncalcified stones, findings which are concerning for cholecystitis, recommend correlation with ultrasound. Multiple hepatic and LEFT renal cysts. Indeterminate 9 x 8 mm intermediate attenuation inferior pole LEFT renal lesion, recommend characterization by non emergent MR imaging with and without contrast to exclude small renal neoplasm. Small hiatal hernia. Aortic Atherosclerosis (ICD10-I70.0). Electronically Signed   By: Lavonia Dana M.D.   On: 09/27/2020 12:54   US Abdomen Limited  Result  Date: 09/27/2020 CLINICAL DATA:  Upper abdominal pain EXAM: ULTRASOUND ABDOMEN LIMITED RIGHT UPPER QUADRANT COMPARISON:  CT abdomen and pelvis September 27, 2020 FINDINGS: Gallbladder: Within the gallbladder, there are echogenic foci which move and shadow consistent with cholelithiasis. Largest individual gallstone measures 2.0 cm. The gallbladder wall is thickened and edematous. There is equivocal pericholecystic fluid. Patient is focally tender over the gallbladder. Common bile duct: Diameter: 3 mm. No intrahepatic or extrahepatic biliary duct dilatation. Liver: There is a septated cyst in the right lobe of the liver measuring 2.4 x 1.3 x 1.8 cm. Within normal limits in parenchymal echogenicity. Portal vein is patent on color Doppler imaging with normal direction of blood flow towards the liver. Other: There is a cyst arising from the upper pole the right kidney measuring 5.1 x 4.9 x 5.2 cm. IMPRESSION: 1. Cholelithiasis with thickened, edematous gallbladder wall and slight pericholecystic fluid. Patient is focally tender over the gallbladder. These are findings indicative of acute cholecystitis. 2. Septated cyst right lobe of liver measuring 2.4 x 1.3 x 1.8 cm. No other focal liver lesions evident. 3. Cyst arising from upper pole right kidney measuring 5.1 x 4.9 x 5.2 cm. Electronically Signed   By: Lowella Grip III M.D.   On: 09/27/2020 14:31   DG Chest Port 1 View  Result Date: 09/28/2020 CLINICAL DATA:  Preoperative evaluation prior to cholecystectomy. EXAM: PORTABLE CHEST 1 VIEW COMPARISON:  Chest radiograph 10/03/2014 FINDINGS: There is a new oval density within the right paratracheal location. Stable cardiac contours. Elevation right hemidiaphragm. No large area pulmonary consolidation. No pleural effusion or pneumothorax. Hazy density projecting over the left hemithorax compatible with overlying implant. IMPRESSION: New oval density within the right paratracheal location which is nonspecific and  potentially may represent thyroid tissue. Mass not entirely excluded. Recommend further evaluation with chest CT. Otherwise no acute cardiopulmonary process. These results will be called to the ordering clinician or representative by the Radiologist Assistant, and communication  documented in the PACS or [employer]. Electronically Signed   By: Lovey Newcomer M.D.   On: 09/28/2020 10:33    Anti-infectives: Anti-infectives (From admission, onward)   Start     Dose/Rate Route Frequency Ordered Stop   09/27/20 1545  cefTRIAXone (ROCEPHIN) 2 g in sodium chloride 0.9 % 100 mL IVPB        2 g 200 mL/hr over 30 Minutes Intravenous Every 24 hours 09/27/20 1543     09/27/20 1545  metroNIDAZOLE (FLAGYL) tablet 500 mg        500 mg Oral Every 8 hours 09/27/20 1543     09/27/20 1530  ciprofloxacin (CIPRO) IVPB 400 mg        400 mg 200 mL/hr over 60 Minutes Intravenous  Once 09/27/20 1529 09/27/20 1732      Assessment/Plan: Impression: Today's labs are pending.  Patient had an abnormal CT scan of the chest.  Some ventricular apical thinning is noted.  Patient did have a negative stress test in 2018.  2D echo has been ordered.  Cardiology consultation is pending.  We will temporarily schedule the patient for laparoscopic cholecystectomy tomorrow.  The risks and benefits of the procedure including bleeding, infection, hepatobiliary injury, and the possibility of an open procedure were fully explained to the patient, who gave informed consent.  Will advance to heart healthy diet for today.  LOS: 2 days    Aviva Signs 09/29/2020

## 2020-09-29 NOTE — Progress Notes (Signed)
PROGRESS NOTE   Ariel Henderson  LKG:[id number] DOB: [birth year] DOA: 09/27/2020 PCP: Denita Lung, MD   Chief Complaint  Patient presents with  . Abdominal Pain    Brief Admission History:  75 y.o. female, with past medical history of hypertension, hyperlipidemia, GERD, breast cancer in remission, she presents to ED secondary to complaints of abdominal pain, nausea and vomiting, reports pain started in epigastric area at 1 PM, kept progressing, sharp, nonradiating, she did develop nausea and vomiting at 1 AM, unable to tolerate any oral intake, liquids or solids, pain was intolerable, so she came to ED, no coffee-ground emesis, fever, chills, dysuria, polyuria, diarrhea or constipation, she denies any chest pain or shortness of breath. -In ED patient was afebrile, work-up significant for elevated AST at 287, ALT at 193, lipase within normal limits at 19, alk phos within normal limit at 78, total bili within normal limit at 0.4, CT abdomen pelvis and ultrasound significant for acute cholecystitis/choledocholithiasis, [facility] consulted to admit.  Assessment & Plan:   Active Problems:   HTN (hypertension)   Gastroesophageal reflux disease without esophagitis   Acute cholecystitis   Calculus of gallbladder with acute cholecystitis without obstruction   1. Acute cholecystitis / cholelithiasis - Pt tolerating clears. LFTs trending down.  Appreciate surgery consult.  Tentative plan for cholecystitis 09/30/20.  IV morphine for pain.  2. Essential hypertension - resumed home propanolol.  3. Goiter - this is the oval density seen on CXR.   4. Possible small apical ventricular aneurysm - check 2D Echo.  Surgery asked to get cardiology involved.  For now surgery might still proceed if ok with cardiology team.  Will see patient 1/24.  5. Transaminitis - LFTs trending down.   6. Indeterminate 9x8 mm attenuation inferior pole left renal lesion - radiologist recommended nonemergent MRI with  and without contrast to exclude small renal neoplasm which should be done outpatient.    DVT prophylaxis: heparin Code Status: full  Family Communication:  Disposition: Home   Status is: Inpatient  Remains inpatient appropriate because:IV treatments appropriate due to intensity of illness or inability to take PO and Inpatient level of care appropriate due to severity of illness  Dispo: The patient is from: Home              Anticipated d/c is to: Home              Anticipated d/c date is: 1 day              Patient currently is not medically stable to d/c.   Difficult to place patient No  Consultants:   Surgery Arnoldo Morale)  Procedures:   Tentative schedule for lap chole 1/14  Antimicrobials:  Ciprofloxacin 1/21>> Metronidazole 1/21>>   Subjective: Pt reports pain nearly resolved, tolerating diet well.   Objective: Vitals:   09/28/20 0404 09/28/20 1426 09/28/20 2108 09/29/20 0636  BP: (!) 118/57 94/74 134/76 131/63  Pulse: (!) 56 68 61 (!) 54  Resp: 19 18 18 16   Temp: 98.1 F (36.7 C) 98.2 F (36.8 C) (!) 97.4 F (36.3 C) (!) 97.4 F (36.3 C)  TempSrc:  Oral Oral Oral  SpO2: 94% 97% 94% 94%  Weight:      Height:        Intake/Output Summary (Last 24 hours) at 09/29/2020 1434 Last data filed at 09/29/2020 1100 Gross per 24 hour  Intake 2232.92 ml  Output -  Net 2232.92 ml   [employer]  09/27/20 1050  Weight: 78.5 kg    Examination:  General exam: Appears calm and comfortable  Respiratory system: Clear to auscultation. Respiratory effort normal. Cardiovascular system: S1 & S2 heard, RRR. No JVD, murmurs, rubs, gallops or clicks. No pedal edema. Gastrointestinal system: Abdomen is nondistended, soft and RUQ TTP. No organomegaly or masses felt. Normal bowel sounds heard. Central nervous system: Alert and oriented. No focal neurological deficits. Extremities: Symmetric 5 x 5 power. Skin: No rashes, lesions or ulcers Psychiatry: Judgement and insight  appear normal. Mood & affect appropriate.   Data Reviewed: I have personally reviewed following labs and imaging studies  CBC: Recent Labs  Lab 09/27/20 1135 09/28/20 0640 09/29/20 0500  WBC 9.5 11.1* 9.6  NEUTROABS 7.3  --   --   HGB 11.8* 11.6* 11.0*  HCT 37.3 37.0 36.3  MCV 94.9 94.9 97.1  PLT 244 250 262    Basic Metabolic Panel: Recent Labs  Lab 09/27/20 1135 09/28/20 0640 09/29/20 0500  NA 138 137 137  K 3.4* 3.7 4.0  CL 110 106 106  CO2 21* 24 24  GLUCOSE 120* 113* 96  BUN 10 10 10   CREATININE 0.57 0.75 0.81  CALCIUM 7.4* 8.3* 8.4*  MG  --   --  1.9    GFR: Estimated Creatinine Clearance: 59.5 mL/min (by C-G formula based on SCr of 0.81 mg/dL).  Liver Function Tests: Recent Labs  Lab 09/27/20 1135 09/28/20 0640 09/29/20 0500  AST 287* 105* 43*  ALT 193* 131* 82*  ALKPHOS 78 66 57  BILITOT 0.4 0.4 0.5  PROT 6.4* 6.1* 5.9*  ALBUMIN 3.4* 3.3* 3.2*    CBG: No results for input(s): GLUCAP in the last 168 hours.  Recent Results (from the past 240 hour(s))  SARS CORONAVIRUS 2 (TAT 6-24 HRS) Nasopharyngeal Nasopharyngeal Swab     Status: None   Collection Time: 09/27/20  2:58 PM   Specimen: Nasopharyngeal Swab  Result Value Ref Range Status   SARS Coronavirus 2 NEGATIVE NEGATIVE Final    Comment: (NOTE) SARS-CoV-2 target nucleic acids are NOT DETECTED.  The SARS-CoV-2 RNA is generally detectable in upper and lower respiratory specimens during the acute phase of infection. Negative results do not preclude SARS-CoV-2 infection, do not rule out co-infections with other pathogens, and should not be used as the sole basis for treatment or other patient management decisions. Negative results must be combined with clinical observations, patient history, and epidemiological information. The expected result is Negative.  Fact Sheet for Patients: SugarRoll.be  Fact Sheet for Healthcare  Providers: https://www.woods-mathews.com/  This test is not yet approved or cleared by the [place] FDA and  has been authorized for detection and/or diagnosis of SARS-CoV-2 by FDA under an Emergency Use Authorization (EUA). This EUA will remain  in effect (meaning this test can be used) for the duration of the COVID-19 declaration under Se ction 564(b)(1) of the Act, 21 U.S.C. section 360bbb-3(b)(1), unless the authorization is terminated or revoked sooner.  Performed at [facility], [place] [address], [place], [place] [address]   Surgical pcr screen     Status: None   Collection Time: 09/29/20  9:58 AM   Specimen: Nasal Mucosa; Nasal Swab  Result Value Ref Range Status   MRSA, PCR NEGATIVE NEGATIVE Final   Staphylococcus aureus NEGATIVE NEGATIVE Final    Comment: (NOTE) The Xpert SA Assay (FDA approved for NASAL specimens in patients 4 years of age and older), is one component of a comprehensive surveillance  program. It is not intended to diagnose infection nor to guide or monitor treatment. Performed at [facility], [address] [place], [place] [address]      Radiology Studies: CT CHEST W CONTRAST  Result Date: 09/28/2020 CLINICAL DATA:  Abnormal chest radiograph, pre cardiac catheterization EXAM: CT CHEST WITH CONTRAST TECHNIQUE: Multidetector CT imaging of the chest was performed during intravenous contrast administration. CONTRAST:  2m OMNIPAQUE IOHEXOL 300 MG/ML  SOLN COMPARISON:  09/28/2020 FINDINGS: Cardiovascular: Heart is normal in size. No pericardial effusion. Leftward cardiomediastinal shift. Mild thinning of the left ventricular apex (series 2/image 101) with associated narrow outpouching at the apex which may reflect a small apical ventricular aneurysm (coronal image 34). No evidence of thoracic aortic aneurysm. No significant coronary atherosclerosis on this non gated study. Mediastinum/Nodes: No suspicious mediastinal lymphadenopathy.  Status post left axillary lymph node dissection. Status post left thyroidectomy. Right substernal goiter (series 2/image 30), accounting for the abnormality along the right paratracheal stripe on chest radiograph, benign. Lungs/Pleura: Evaluation of the lung parenchyma is constrained by respiratory motion. Volume loss with linear scarring/atelectasis in the left lower lobe. Mild lingular scarring/atelectasis. 4 mm subpleural nodule in the posterior right upper lobe (series 4/image 30), likely benign. No suspicious pulmonary nodules. Mild elevation of the right hemidiaphragm. Associated right basilar atelectasis. No pleural effusion or pneumothorax. Upper Abdomen: Visualized upper abdomen is notable for a moderate hiatal hernia, hepatic cysts measuring up to 2.3 cm (series 2/image 120), and mild gallbladder wall edema (series 2/image 134). Musculoskeletal: Status post left mastectomy with reconstruction. No focal osseous lesions. IMPRESSION: Radiographic abnormality corresponds to right substernal goiter in this patient status post left thyroidectomy, benign. 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 Cardiology consultation is suggested. No significant coronary atherosclerosis on this non gated study. 4 mm nodule in the posterior right upper lobe, likely benign. This is not considered suspicious for metastatic disease. While Fleischner Society guidelines do not apply in the setting of known malignancy, a single follow-up CT chest can be considered in 1 year if clinically warranted. Electronically Signed   By: SJulian HyM.D.   On: 09/28/2020 12:24   DG Chest Port 1 View  Result Date: 09/28/2020 CLINICAL DATA:  Preoperative evaluation prior to cholecystectomy. EXAM: PORTABLE CHEST 1 VIEW COMPARISON:  Chest radiograph 10/03/2014 FINDINGS: There is a new oval density within the right paratracheal location. Stable cardiac contours. Elevation right hemidiaphragm. No  large area pulmonary consolidation. No pleural effusion or pneumothorax. Hazy density projecting over the left hemithorax compatible with overlying implant. IMPRESSION: New oval density within the right paratracheal location which is nonspecific and potentially may represent thyroid tissue. Mass not entirely excluded. Recommend further evaluation with chest CT. Otherwise no acute cardiopulmonary process. These results will be called to the ordering clinician or representative by the Radiologist Assistant, and communication documented in the PACS or [employer] Electronically Signed   By: DLovey NewcomerM.D.   On: 09/28/2020 10:33   ECHOCARDIOGRAM COMPLETE  Result Date: 09/29/2020    ECHOCARDIOGRAM REPORT   Patient Name:   JMEGAHN KILLINGSDate of Exam: 09/29/2020 Medical Rec #:  [id number]       Height:       63.0 in Accession #:    [id number]      Weight:       173.0 lb Date of Birth:  609-27-1947       BSA:  1.818 m Patient Age:    11 years         BP:           131/63 mmHg Patient Gender: F                HR:           54 bpm. Exam Location:  [facility] Procedure: 2D Echo, Cardiac Doppler and Color Doppler Indications:    Peroperative Evaluation  History:        Patient has no prior history of Echocardiogram examinations.                 Risk Factors:Hypertension and Dyslipidemia. GERD.  Sonographer:    Alvino Chapel RCS Referring Phys: [place] Comments: Image acquisition challenging due to mastectomy and Image acquisition challenging due to breast implants. IMPRESSIONS  1. Left ventricular ejection fraction, by estimation, is 60 to 65%. The left ventricle has normal function. Left ventricular endocardial border not optimally defined to evaluate regional wall motion. There is moderate left ventricular hypertrophy of the  septal segment. Left ventricular diastolic parameters are indeterminate.  2. Right ventricular systolic function was not well visualized. The right  ventricular size is not well visualized. Tricuspid regurgitation signal is inadequate for assessing PA pressure.  3. The mitral valve is normal in structure. No evidence of mitral valve regurgitation. No evidence of mitral stenosis.  4. The aortic valve was not well visualized. Aortic valve regurgitation is not visualized. Mild aortic valve sclerosis is present, with no evidence of aortic valve stenosis.  5. The inferior vena cava is normal in size with greater than 50% respiratory variability, suggesting right atrial pressure of 3 mmHg. FINDINGS  Left Ventricle: Left ventricular ejection fraction, by estimation, is 60 to 65%. The left ventricle has normal function. Left ventricular endocardial border not optimally defined to evaluate regional wall motion. The left ventricular internal cavity size was normal in size. There is moderate left ventricular hypertrophy of the septal segment. Left ventricular diastolic parameters are indeterminate. Normal left ventricular filling pressure. Right Ventricle: The right ventricular size is not well visualized. Right vetricular wall thickness was not assessed. Right ventricular systolic function was not well visualized. Tricuspid regurgitation signal is inadequate for assessing PA pressure. Left Atrium: Left atrial size was normal in size. Right Atrium: Right atrial size was normal in size. Pericardium: There is no evidence of pericardial effusion. Mitral Valve: The mitral valve is normal in structure. No evidence of mitral valve regurgitation. No evidence of mitral valve stenosis. Tricuspid Valve: The tricuspid valve is normal in structure. Tricuspid valve regurgitation is trivial. No evidence of tricuspid stenosis. Aortic Valve: The aortic valve was not well visualized. Aortic valve regurgitation is not visualized. Mild aortic valve sclerosis is present, with no evidence of aortic valve stenosis. Pulmonic Valve: The pulmonic valve was normal in structure. Pulmonic valve  regurgitation is not visualized. No evidence of pulmonic stenosis. Aorta: The aortic root is normal in size and structure. Venous: The inferior vena cava is normal in size with greater than 50% respiratory variability, suggesting right atrial pressure of 3 mmHg. IAS/Shunts: No atrial level shunt detected by color flow Doppler.  LEFT VENTRICLE PLAX 2D LVIDd:         4.10 cm  Diastology LVIDs:         2.50 cm  LV e' medial:    6.09 cm/s LV PW:         1.00 cm  LV E/e' medial:  15.0 LV IVS:        1.30 cm  LV e' lateral:   9.14 cm/s LVOT diam:     1.80 cm  LV E/e' lateral: 10.0 LV SV:         77 LV SV Index:   42 LVOT Area:     2.54 cm  RIGHT VENTRICLE RV S prime:     12.90 cm/s TAPSE (M-mode): 1.8 cm LEFT ATRIUM             Index       RIGHT ATRIUM           Index LA diam:        3.70 cm 2.04 cm/m  RA Area:     13.10 cm LA Vol (A2C):   38.2 ml 21.01 ml/m RA Volume:   31.90 ml  17.55 ml/m LA Vol (A4C):   37.8 ml 20.79 ml/m LA Biplane Vol: 38.2 ml 21.01 ml/m  AORTIC VALVE LVOT Vmax:   112.00 cm/s LVOT Vmean:  72.200 cm/s LVOT VTI:    0.302 m  AORTA Ao Root diam: 3.00 cm MITRAL VALVE MV Area (PHT): 4.06 cm    SHUNTS MV Decel Time: 187 msec    Systemic VTI:  0.30 m MV E velocity: 91.30 cm/s  Systemic Diam: 1.80 cm MV A velocity: 88.70 cm/s MV E/A ratio:  1.03 Fransico Him MD Electronically signed by Fransico Him MD Signature Date/Time: 09/29/2020/12:02:46 PM    Final    Scheduled Meds: . buPROPion  150 mg Oral Daily  . citalopram  40 mg Oral Daily  . famotidine  20 mg Oral Daily  . fluticasone  2 spray Each Nare Daily  . heparin  5,000 Units Subcutaneous Q8H  . levothyroxine  88 mcg Oral Daily  . metroNIDAZOLE  500 mg Oral Q8H  . pantoprazole  40 mg Oral Daily  . pravastatin  40 mg Oral q1800  . propranolol  40 mg Oral Daily   Continuous Infusions: . sodium chloride 50 mL/hr at 09/29/20 0851  . cefTRIAXone (ROCEPHIN)  IV 2 g (09/28/20 1448)     LOS: 2 days   Time spent: 35 mins   Morell Mears  Wynetta Emery, MD How to contact the [facility] Attending or Consulting provider [place] or covering provider during after hours [place], for this patient?  1. Check the care team in [facility] and look for a) attending/consulting [facility] provider listed and b) the [facility] team listed 2. Log into [email or web address] and use [place]'s universal password to access. If you do not have the password, please contact the hospital operator. 3. Locate the [facility] provider you are looking for under [facility] and page to a number that you can be directly reached. 4. If you still have difficulty reaching the provider, please page the [facility] (Director on Call) for the Hospitalists listed on amion for assistance.  09/29/2020, 2:34 PM

## 2020-09-30 ENCOUNTER — Inpatient Hospital Stay (HOSPITAL_COMMUNITY): Payer: Medicare Other | Admitting: Anesthesiology

## 2020-09-30 ENCOUNTER — Encounter (HOSPITAL_COMMUNITY): Payer: Self-pay | Admitting: Internal Medicine

## 2020-09-30 ENCOUNTER — Encounter (HOSPITAL_COMMUNITY)
Admission: EM | Disposition: A | Discharge status: Home / Self Care | Payer: Self-pay | Source: Home / Self Care | Attending: Family Medicine

## 2020-09-30 DIAGNOSIS — K8000 Calculus of gallbladder with acute cholecystitis without obstruction: Secondary | ICD-10-CM | POA: Diagnosis not present

## 2020-09-30 HISTORY — PX: CHOLECYSTECTOMY: SHX55

## 2020-09-30 SURGERY — LAPAROSCOPIC CHOLECYSTECTOMY
Anesthesia: General | Site: Abdomen

## 2020-09-30 MED ORDER — PROPOFOL 10 MG/ML IV BOLUS
INTRAVENOUS | Status: DC | PRN
  Administered 2020-09-30: 150 mg via INTRAVENOUS

## 2020-09-30 MED ORDER — FENTANYL CITRATE (PF) 100 MCG/2ML IJ SOLN
INTRAMUSCULAR | Status: AC
  Filled 2020-09-30: qty 2

## 2020-09-30 MED ORDER — FENTANYL CITRATE (PF) 100 MCG/2ML IJ SOLN
INTRAMUSCULAR | Status: DC | PRN
  Administered 2020-09-30 (×3): 50 ug via INTRAVENOUS

## 2020-09-30 MED ORDER — SODIUM CHLORIDE 0.9 % IR SOLN
Status: DC | PRN
  Administered 2020-09-30: 1000 mL

## 2020-09-30 MED ORDER — EPHEDRINE SULFATE 50 MG/ML IJ SOLN
INTRAMUSCULAR | Status: DC | PRN
  Administered 2020-09-30 (×2): 10 mg via INTRAVENOUS

## 2020-09-30 MED ORDER — LACTATED RINGERS IV SOLN: INTRAVENOUS | Status: DC

## 2020-09-30 MED ORDER — DEXAMETHASONE SODIUM PHOSPHATE 10 MG/ML IJ SOLN
INTRAMUSCULAR | Status: DC | PRN
  Administered 2020-09-30: 8 mg via INTRAVENOUS

## 2020-09-30 MED ORDER — HEMOSTATIC AGENTS (NO CHARGE) OPTIME
TOPICAL | Status: DC | PRN
  Administered 2020-09-30 (×2): 1 via TOPICAL

## 2020-09-30 MED ORDER — GLYCOPYRROLATE 0.2 MG/ML IJ SOLN
INTRAMUSCULAR | Status: DC | PRN
  Administered 2020-09-30: .2 mg via INTRAVENOUS

## 2020-09-30 MED ORDER — SIMETHICONE 80 MG PO CHEW: 40.0000 mg | CHEWABLE_TABLET | Freq: Four times a day (QID) | ORAL | Status: DC | PRN

## 2020-09-30 MED ORDER — FENTANYL CITRATE (PF) 250 MCG/5ML IJ SOLN
INTRAMUSCULAR | Status: AC
  Filled 2020-09-30: qty 5

## 2020-09-30 MED ORDER — ONDANSETRON HCL 4 MG/2ML IJ SOLN
INTRAMUSCULAR | Status: AC
  Filled 2020-09-30: qty 2

## 2020-09-30 MED ORDER — FENTANYL CITRATE (PF) 100 MCG/2ML IJ SOLN: 25.0000 ug | INTRAMUSCULAR | Status: DC | PRN

## 2020-09-30 MED ORDER — BUPIVACAINE LIPOSOME 1.3 % IJ SUSP
INTRAMUSCULAR | Status: DC | PRN
  Administered 2020-09-30: 20 mL

## 2020-09-30 MED ORDER — CHLORHEXIDINE GLUCONATE 0.12 % MT SOLN
15.0000 mL | Freq: Once | OROMUCOSAL | Status: AC
  Administered 2020-09-30: 15 mL via OROMUCOSAL

## 2020-09-30 MED ORDER — SUCCINYLCHOLINE CHLORIDE 20 MG/ML IJ SOLN
INTRAMUSCULAR | Status: DC | PRN
  Administered 2020-09-30: 120 mg via INTRAVENOUS

## 2020-09-30 MED ORDER — CHLORHEXIDINE GLUCONATE CLOTH 2 % EX PADS
6.0000 | MEDICATED_PAD | Freq: Once | CUTANEOUS | Status: AC
  Administered 2020-09-30: 6 via TOPICAL

## 2020-09-30 MED ORDER — ACETAMINOPHEN 325 MG PO TABS: 650.0000 mg | ORAL_TABLET | Freq: Four times a day (QID) | ORAL | Status: DC | PRN

## 2020-09-30 MED ORDER — SUCCINYLCHOLINE CHLORIDE 200 MG/10ML IV SOSY
PREFILLED_SYRINGE | INTRAVENOUS | Status: AC
  Filled 2020-09-30: qty 10

## 2020-09-30 MED ORDER — ROCURONIUM BROMIDE 10 MG/ML (PF) SYRINGE
PREFILLED_SYRINGE | INTRAVENOUS | Status: AC
  Filled 2020-09-30: qty 10

## 2020-09-30 MED ORDER — DEXAMETHASONE SODIUM PHOSPHATE 10 MG/ML IJ SOLN
INTRAMUSCULAR | Status: AC
  Filled 2020-09-30: qty 1

## 2020-09-30 MED ORDER — BUPIVACAINE LIPOSOME 1.3 % IJ SUSP
INTRAMUSCULAR | Status: AC
  Filled 2020-09-30: qty 20

## 2020-09-30 MED ORDER — SUGAMMADEX SODIUM 500 MG/5ML IV SOLN
INTRAVENOUS | Status: DC | PRN
  Administered 2020-09-30: 200 mg via INTRAVENOUS

## 2020-09-30 MED ORDER — ONDANSETRON HCL 4 MG/2ML IJ SOLN
INTRAMUSCULAR | Status: DC | PRN
  Administered 2020-09-30: 4 mg via INTRAVENOUS

## 2020-09-30 MED ORDER — EPHEDRINE 5 MG/ML INJ
INTRAVENOUS | Status: AC
  Filled 2020-09-30: qty 10

## 2020-09-30 MED ORDER — GLYCOPYRROLATE PF 0.2 MG/ML IJ SOSY
PREFILLED_SYRINGE | INTRAMUSCULAR | Status: AC
  Filled 2020-09-30: qty 1

## 2020-09-30 MED ORDER — PROPOFOL 10 MG/ML IV BOLUS
INTRAVENOUS | Status: AC
  Filled 2020-09-30: qty 40

## 2020-09-30 MED ORDER — ACETAMINOPHEN 650 MG RE SUPP: 650.0000 mg | Freq: Four times a day (QID) | RECTAL | Status: DC | PRN

## 2020-09-30 MED ORDER — ROCURONIUM BROMIDE 100 MG/10ML IV SOLN
INTRAVENOUS | Status: DC | PRN
  Administered 2020-09-30: 30 mg via INTRAVENOUS

## 2020-09-30 MED ORDER — MORPHINE SULFATE (PF) 2 MG/ML IV SOLN
2.0000 mg | INTRAVENOUS | Status: DC | PRN
  Administered 2020-09-30: 2 mg via INTRAVENOUS
  Filled 2020-09-30: qty 1

## 2020-09-30 MED ORDER — ORAL CARE MOUTH RINSE: 15.0000 mL | Freq: Once | OROMUCOSAL | Status: AC

## 2020-09-30 SURGICAL SUPPLY — 50 items
ADH SKN CLS APL DERMABOND .7 (GAUZE/BANDAGES/DRESSINGS) ×1
APL SRG 38 LTWT LNG FL B (MISCELLANEOUS) ×1
APPLICATOR ARISTA FLEXITIP XL (MISCELLANEOUS) ×1 IMPLANT
APPLIER CLIP ROT 10 11.4 M/L (STAPLE) ×2
APR CLP MED LRG 11.4X10 (STAPLE) ×1
BAG RETRIEVAL 10 (BASKET) ×1
CLIP APPLIE ROT 10 11.4 M/L (STAPLE) ×1 IMPLANT
CLOTH BEACON ORANGE TIMEOUT ST (SAFETY) ×2 IMPLANT
COVER LIGHT HANDLE STERIS (MISCELLANEOUS) ×4 IMPLANT
COVER WAND RF STERILE (DRAPES) ×2 IMPLANT
CUTTER FLEX LINEAR 45M (STAPLE) ×1 IMPLANT
DERMABOND ADVANCED (GAUZE/BANDAGES/DRESSINGS) ×1
DERMABOND ADVANCED .7 DNX12 (GAUZE/BANDAGES/DRESSINGS) ×1 IMPLANT
DURAPREP 26ML APPLICATOR (WOUND CARE) ×2 IMPLANT
ELECT REM PT RETURN 9FT ADLT (ELECTROSURGICAL) ×2
ELECTRODE REM PT RTRN 9FT ADLT (ELECTROSURGICAL) ×1 IMPLANT
GLOVE BIO SURGEON STRL SZ7 (GLOVE) ×1 IMPLANT
GLOVE BIOGEL PI IND STRL 7.0 (GLOVE) ×2 IMPLANT
GLOVE BIOGEL PI INDICATOR 7.0 (GLOVE) ×2
GLOVE SURG SS PI 7.5 STRL IVOR (GLOVE) ×2 IMPLANT
GOWN STRL REUS W/TWL LRG LVL3 (GOWN DISPOSABLE) ×6 IMPLANT
HEMOSTAT ARISTA ABSORB 3G PWDR (HEMOSTASIS) ×1 IMPLANT
HEMOSTAT SNOW SURGICEL 2X4 (HEMOSTASIS) ×2 IMPLANT
INST SET LAPROSCOPIC AP (KITS) ×2 IMPLANT
KIT TURNOVER KIT A (KITS) ×2 IMPLANT
MANIFOLD NEPTUNE II (INSTRUMENTS) ×2 IMPLANT
NDL HYPO 18GX1.5 BLUNT FILL (NEEDLE) ×1 IMPLANT
NDL HYPO 21X1.5 SAFETY (NEEDLE) ×1 IMPLANT
NDL INSUFFLATION 14GA 120MM (NEEDLE) ×1 IMPLANT
NEEDLE HYPO 18GX1.5 BLUNT FILL (NEEDLE) ×2 IMPLANT
NEEDLE HYPO 21X1.5 SAFETY (NEEDLE) ×2 IMPLANT
NEEDLE INSUFFLATION 14GA 120MM (NEEDLE) ×2 IMPLANT
NS IRRIG 1000ML POUR BTL (IV SOLUTION) ×2 IMPLANT
PACK LAP CHOLE LZT030E (CUSTOM PROCEDURE TRAY) ×2 IMPLANT
PAD ARMBOARD 7.5X6 YLW CONV (MISCELLANEOUS) ×2 IMPLANT
RELOAD 45 VASCULAR/THIN (ENDOMECHANICALS) ×2 IMPLANT
RELOAD STAPLE 45 2.5 WHT GRN (ENDOMECHANICALS) IMPLANT
SET BASIN LINEN APH (SET/KITS/TRAYS/PACK) ×2 IMPLANT
SET TUBE SMOKE EVAC HIGH FLOW (TUBING) ×2 IMPLANT
SLEEVE ENDOPATH XCEL 5M (ENDOMECHANICALS) ×2 IMPLANT
SUT MNCRL AB 4-0 PS2 18 (SUTURE) ×4 IMPLANT
SUT VICRYL 0 UR6 27IN ABS (SUTURE) ×2 IMPLANT
SYR 20ML LL LF (SYRINGE) ×4 IMPLANT
SYS BAG RETRIEVAL 10MM (BASKET) ×1
SYSTEM BAG RETRIEVAL 10MM (BASKET) ×1 IMPLANT
TROCAR ENDO BLADELESS 11MM (ENDOMECHANICALS) ×2 IMPLANT
TROCAR XCEL NON-BLD 5MMX100MML (ENDOMECHANICALS) ×2 IMPLANT
TROCAR XCEL UNIV SLVE 11M 100M (ENDOMECHANICALS) ×2 IMPLANT
TUBE CONNECTING 12X1/4 (SUCTIONS) ×2 IMPLANT
WARMER LAPAROSCOPE (MISCELLANEOUS) ×2 IMPLANT

## 2020-09-30 NOTE — Anesthesia Postprocedure Evaluation (Signed)
Anesthesia Post Note  Patient: Ariel Henderson  Procedure(s) Performed: LAPAROSCOPIC CHOLECYSTECTOMY (N/A Abdomen)  Patient location during evaluation: PACU Anesthesia Type: General Level of consciousness: awake and alert and patient cooperative Pain management: satisfactory to patient Vital Signs Assessment: post-procedure vital signs reviewed and stable Respiratory status: spontaneous breathing and patient connected to nasal cannula oxygen Cardiovascular status: stable Postop Assessment: no apparent nausea or vomiting Anesthetic complications: no   No complications documented.   Last Vitals:  Vitals:   09/30/20 1030 09/30/20 1045  BP: 127/74 (!) 112/54  Pulse: 72 73  Resp: 20 17  Temp:    SpO2: 95% 97%    Last Pain:  Vitals:   09/30/20 1020  TempSrc:   PainSc: Asleep                 Macey Wurtz

## 2020-09-30 NOTE — Op Note (Signed)
Patient:  Ariel Henderson  DOB:  [birth year]  MRN:  [id number]   Preop Diagnosis: Acute cholecystitis, cholelithiasis  Postop Diagnosis: Same  Procedure: Laparoscopic cholecystectomy  Surgeon: Aviva Signs, MD  Anes: General endotracheal  Indications: Patient is a 75 year old white female who presents with acute cholecystitis secondary to cholelithiasis.  The risks and benefits of the procedure including bleeding, infection, hepatobiliary injury, and the possibility of an open procedure were fully explained to the patient, who gave informed consent.  Procedure note: The patient was placed in the supine position.  After induction of general endotracheal anesthesia, the abdomen was prepped and draped using the usual sterile technique with ChloraPrep.  Surgical site confirmation was performed.  A supraumbilical incision was made down to the fascia.  A Veress needle was introduced into the abdominal cavity and confirmation of placement was done using the saline drop test.  The abdomen was then insufflated to 15 mmHg pressure.  An 11 mm trocar was introduced into the abdominal cavity under direct visualization without difficulty.  The patient was placed in reverse Trendelenburg position and an additional 1 mm trocar was placed in the epigastric region and 5 mm trochars were placed the right upper quadrant and right flank regions.  Liver was inspected and noted to be within normal limits.  The gallbladder was noted to be tense and distended with a thickened, edematous wall.  In order to facilitate exposure, the gallbladder was decompressed.  Hydrops of the gallbladder was found.  The gallbladder was then retracted in a dynamic fashion in order to provide a critical view of the triangle of [place]ot.  The cystic duct was first identified.  Its juncture to the infundibulum was fully identified.  Given its friable nature, a vascular Endo GIA was placed across the cystic duct and fired.  The cystic artery was  ligated and divided using clips.  The gallbladder was freed away from the gallbladder fossa using Bovie electrocautery.  The gallbladder was delivered through the epigastric trocar site using an Endo Catch bag.  The gallbladder fossa was inspected and any bleeding was controlled using Bovie electrocautery.  No bile leakage was noted.  Arista and Surgicel were placed on the gallbladder fossa.  All fluid and air were then evacuated from the abdominal cavity prior to removal of the trochars.  All wounds were irrigated with normal saline.  All wounds were injected with Exparel.  The supraumbilical fascia as well as epigastric fascia were reapproximated using an 0 Vicryl interrupted suture.  All skin incisions were closed using a 4-0 Monocryl subcuticular suture.  Dermabond was applied.  All tape and needle counts were correct at the end of the procedure.  The patient was extubated in the operating room and transferred to PACU in stable condition.  Complications: None  EBL: Minimal  Specimen: Gallbladder

## 2020-09-30 NOTE — Interval H&P Note (Signed)
History and Physical Interval Note:  09/30/2020 8:01 AM  Ariel Henderson  has presented today for surgery, with the diagnosis of cholecystitis, cholelithiasis.  The various methods of treatment have been discussed with the patient and family. After consideration of risks, benefits and other options for treatment, the patient has consented to  Procedure(s): LAPAROSCOPIC CHOLECYSTECTOMY (N/A) as a surgical intervention.  The patient's history has been reviewed, patient examined, no change in status, stable for surgery.  I have reviewed the patient's chart and labs.  Questions were answered to the patient's satisfaction.     Aviva Signs

## 2020-09-30 NOTE — Progress Notes (Signed)
PROGRESS NOTE   Ariel Henderson  ZLD:[id number] DOB: 10/15/1945 DOA: 09/27/2020 PCP: Denita Lung, MD   Chief Complaint  Patient presents with  . Abdominal Pain    Brief Admission History:  75 y.o. female, with past medical history of hypertension, hyperlipidemia, GERD, breast cancer in remission, she presents to ED secondary to complaints of abdominal pain, nausea and vomiting, reports pain started in epigastric area at 1 PM, kept progressing, sharp, nonradiating, she did develop nausea and vomiting at 1 AM, unable to tolerate any oral intake, liquids or solids, pain was intolerable, so she came to ED, no coffee-ground emesis, fever, chills, dysuria, polyuria, diarrhea or constipation, she denies any chest pain or shortness of breath. -In ED patient was afebrile, work-up significant for elevated AST at 287, ALT at 193, lipase within normal limits at 19, alk phos within normal limit at 78, total bili within normal limit at 0.4, CT abdomen pelvis and ultrasound significant for acute cholecystitis/choledocholithiasis, [facility] consulted to admit.  Assessment & Plan:   Active Problems:   HTN (hypertension)   Gastroesophageal reflux disease without esophagitis   Acute cholecystitis   Calculus of gallbladder with acute cholecystitis without obstruction  1. POD#0 s/p lap chole - Pt tolerated surgery well.  Dr. Arnoldo Morale following. Plan to monitor overnight in hospital.   2. Essential hypertension - resumed home propanolol.  3. Goiter - this is the oval density seen on CXR.   4. Possible small apical ventricular aneurysm -  2D Echo came back with LVEF 60-65% with normal ventricular function. 5. Transaminitis - LFTs trending down.   6. Indeterminate 9x8 mm attenuation inferior pole left renal lesion - radiologist recommended nonemergent MRI with and without contrast to exclude small renal neoplasm which should be done outpatient.    DVT prophylaxis: heparin Code Status: full   Family Communication:  Disposition: Home   Status is: Inpatient  Remains inpatient appropriate because:IV treatments appropriate due to intensity of illness or inability to take PO and Inpatient level of care appropriate due to severity of illness  Dispo: The patient is from: Home              Anticipated d/c is to: Home              Anticipated d/c date is: 1 day              Patient currently is not medically stable to d/c.   Difficult to place patient No  Consultants:   Surgery Arnoldo Morale)  Procedures:   Tentative schedule for lap chole 1/14  Antimicrobials:  Ciprofloxacin 1/21>> Metronidazole 1/21>>   Subjective: Pt tolerated surgery well.  No complaints.  Pain controlled.     Objective: Vitals:   09/30/20 1020 09/30/20 1030 09/30/20 1045 09/30/20 1122  BP: (!) 114/57 127/74 (!) 112/54 (!) 125/56  Pulse: 76 72 73 (!) 58  Resp: _0 Temp: 98.2 F (36.8 C)   98.2 F (36.8 C)  TempSrc:    Oral  SpO2: 98% 95% 97% 98%  Weight:      Height:        Intake/Output Summary (Last 24 hours) at 09/30/2020 1204 Last data filed at 09/30/2020 0959 Gross per 24 hour  Intake 740 ml  Output 15 ml  Net 725 ml   Filed Weights   09/27/20 1050  Weight: 78.5 kg    Examination:  General exam: Appears calm and comfortable  Respiratory system: Clear to auscultation. Respiratory  effort normal. Cardiovascular system: S1 & S2 heard, RRR. No JVD, murmurs, rubs, gallops or clicks. No pedal edema. Gastrointestinal system: Abdomen is nondistended, soft and RUQ TTP. No organomegaly or masses felt. Normal bowel sounds heard. Central nervous system: Alert and oriented. No focal neurological deficits. Extremities: Symmetric 5 x 5 power. Skin: wounds clean and dry and intact.  Psychiatry: Judgement and insight appear normal. Mood & affect appropriate.   Data Reviewed: I have personally reviewed following labs and imaging studies  CBC: Recent Labs  Lab 09/27/20 1135  09/28/20 0640 09/29/20 0500  WBC 9.5 11.1* 9.6  NEUTROABS 7.3  --   --   HGB 11.8* 11.6* 11.0*  HCT 37.3 37.0 36.3  MCV 94.9 94.9 97.1  PLT 244 250 982    Basic Metabolic Panel: Recent Labs  Lab 09/27/20 1135 09/28/20 0640 09/29/20 0500  NA 138 137 137  K 3.4* 3.7 4.0  CL 110 106 106  CO2 21* 24 24  GLUCOSE 120* 113* 96  BUN _0 CREATININE 0.57 0.75 0.81  CALCIUM 7.4* 8.3* 8.4*  MG  --   --  1.9    GFR: Estimated Creatinine Clearance: 59.5 mL/min (by C-G formula based on SCr of 0.81 mg/dL).  Liver Function Tests: Recent Labs  Lab 09/27/20 1135 09/28/20 0640 09/29/20 0500  AST 287* 105* 43*  ALT 193* 131* 82*  ALKPHOS 78 66 57  BILITOT 0.4 0.4 0.5  PROT 6.4* 6.1* 5.9*  ALBUMIN 3.4* 3.3* 3.2*    CBG: No results for input(s): GLUCAP in the last 168 hours.  Recent Results (from the past 240 hour(s))  SARS CORONAVIRUS 2 (TAT 6-24 HRS) Nasopharyngeal Nasopharyngeal Swab     Status: None   Collection Time: 09/27/20  2:58 PM   Specimen: Nasopharyngeal Swab  Result Value Ref Range Status   SARS Coronavirus 2 NEGATIVE NEGATIVE Final    Comment: (NOTE) SARS-CoV-2 target nucleic acids are NOT DETECTED.  The SARS-CoV-2 RNA is generally detectable in upper and lower respiratory specimens during the acute phase of infection. Negative results do not preclude SARS-CoV-2 infection, do not rule out co-infections with other pathogens, and should not be used as the sole basis for treatment or other patient management decisions. Negative results must be combined with clinical observations, patient history, and epidemiological information. The expected result is Negative.  Fact Sheet for Patients: SugarRoll.be  Fact Sheet for Healthcare Providers: https://www.woods-mathews.com/  This test is not yet approved or cleared by the [place] FDA and  has been authorized for detection and/or diagnosis of SARS-CoV-2 by FDA  under an Emergency Use Authorization (EUA). This EUA will remain  in effect (meaning this test can be used) for the duration of the COVID-19 declaration under Se ction 564(b)(1) of the Act, 21 U.S.C. section 360bbb-3(b)(1), unless the authorization is terminated or revoked sooner.  Performed at [facility], [address] [place], [place] [address]   Surgical pcr screen     Status: None   Collection Time: 09/29/20  9:58 AM   Specimen: Nasal Mucosa; Nasal Swab  Result Value Ref Range Status   MRSA, PCR NEGATIVE NEGATIVE Final   Staphylococcus aureus NEGATIVE NEGATIVE Final    Comment: (NOTE) The Xpert SA Assay (FDA approved for NASAL specimens in patients 67 years of age and older), is one component of a comprehensive surveillance program. It is not intended to diagnose infection nor to guide or monitor treatment. Performed at [facility], [address]  [place], [place] [address]      Radiology Studies: ECHOCARDIOGRAM COMPLETE  Result Date: 09/29/2020    ECHOCARDIOGRAM REPORT   Patient Name:   Ariel Henderson Date of Exam: 09/29/2020 Medical Rec #:  [id number]        Height:       63.0 in Accession #:    [id number]       Weight:       173.0 lb Date of Birth:  04-12-1946        BSA:          1.818 m Patient Age:    48 years         BP:           131/63 mmHg Patient Gender: F                HR:           54 bpm. Exam Location:  [facility] Procedure: 2D Echo, Cardiac Doppler and Color Doppler Indications:    Peroperative Evaluation  History:        Patient has no prior history of Echocardiogram examinations.                 Risk Factors:Hypertension and Dyslipidemia. GERD.  Sonographer:    Alvino Chapel RCS Referring Phys: [place] Comments: Image acquisition challenging due to mastectomy and Image acquisition challenging due to breast implants. IMPRESSIONS  1. Left ventricular ejection fraction, by estimation, is 60 to 65%. The left ventricle has normal  function. Left ventricular endocardial border not optimally defined to evaluate regional wall motion. There is moderate left ventricular hypertrophy of the  septal segment. Left ventricular diastolic parameters are indeterminate.  2. Right ventricular systolic function was not well visualized. The right ventricular size is not well visualized. Tricuspid regurgitation signal is inadequate for assessing PA pressure.  3. The mitral valve is normal in structure. No evidence of mitral valve regurgitation. No evidence of mitral stenosis.  4. The aortic valve was not well visualized. Aortic valve regurgitation is not visualized. Mild aortic valve sclerosis is present, with no evidence of aortic valve stenosis.  5. The inferior vena cava is normal in size with greater than 50% respiratory variability, suggesting right atrial pressure of 3 mmHg. FINDINGS  Left Ventricle: Left ventricular ejection fraction, by estimation, is 60 to 65%. The left ventricle has normal function. Left ventricular endocardial border not optimally defined to evaluate regional wall motion. The left ventricular internal cavity size was normal in size. There is moderate left ventricular hypertrophy of the septal segment. Left ventricular diastolic parameters are indeterminate. Normal left ventricular filling pressure. Right Ventricle: The right ventricular size is not well visualized. Right vetricular wall thickness was not assessed. Right ventricular systolic function was not well visualized. Tricuspid regurgitation signal is inadequate for assessing PA pressure. Left Atrium: Left atrial size was normal in size. Right Atrium: Right atrial size was normal in size. Pericardium: There is no evidence of pericardial effusion. Mitral Valve: The mitral valve is normal in structure. No evidence of mitral valve regurgitation. No evidence of mitral valve stenosis. Tricuspid Valve: The tricuspid valve is normal in structure. Tricuspid valve regurgitation is  trivial. No evidence of tricuspid stenosis. Aortic Valve: The aortic valve was not well visualized. Aortic valve regurgitation is not visualized. Mild aortic valve sclerosis is present, with no evidence of aortic valve stenosis. Pulmonic Valve: The pulmonic valve was normal in structure. Pulmonic valve regurgitation is not  visualized. No evidence of pulmonic stenosis. Aorta: The aortic root is normal in size and structure. Venous: The inferior vena cava is normal in size with greater than 50% respiratory variability, suggesting right atrial pressure of 3 mmHg. IAS/Shunts: No atrial level shunt detected by color flow Doppler.  LEFT VENTRICLE PLAX 2D LVIDd:         4.10 cm  Diastology LVIDs:         2.50 cm  LV e' medial:    6.09 cm/s LV PW:         1.00 cm  LV E/e' medial:  15.0 LV IVS:        1.30 cm  LV e' lateral:   9.14 cm/s LVOT diam:     1.80 cm  LV E/e' lateral: 10.0 LV SV:         77 LV SV Index:   42 LVOT Area:     2.54 cm  RIGHT VENTRICLE RV S prime:     12.90 cm/s TAPSE (M-mode): 1.8 cm LEFT ATRIUM             Index       RIGHT ATRIUM           Index LA diam:        3.70 cm 2.04 cm/m  RA Area:     13.10 cm LA Vol (A2C):   38.2 ml 21.01 ml/m RA Volume:   31.90 ml  17.55 ml/m LA Vol (A4C):   37.8 ml 20.79 ml/m LA Biplane Vol: 38.2 ml 21.01 ml/m  AORTIC VALVE LVOT Vmax:   112.00 cm/s LVOT Vmean:  72.200 cm/s LVOT VTI:    0.302 m  AORTA Ao Root diam: 3.00 cm MITRAL VALVE MV Area (PHT): 4.06 cm    SHUNTS MV Decel Time: 187 msec    Systemic VTI:  0.30 m MV E velocity: 91.30 cm/s  Systemic Diam: 1.80 cm MV A velocity: 88.70 cm/s MV E/A ratio:  1.03 Fransico Him MD Electronically signed by Fransico Him MD Signature Date/Time: 09/29/2020/12:02:46 PM    Final    Scheduled Meds: . buPROPion  150 mg Oral Daily  . citalopram  40 mg Oral Daily  . famotidine  20 mg Oral Daily  . fluticasone  2 spray Each Nare Daily  . heparin  5,000 Units Subcutaneous Q8H  . levothyroxine  88 mcg Oral Daily  .  pantoprazole  40 mg Oral Daily  . pravastatin  40 mg Oral q1800  . propranolol  40 mg Oral Daily   Continuous Infusions: . sodium chloride 50 mL/hr at 09/30/20 1132  . cefTRIAXone (ROCEPHIN)  IV 2 g (09/29/20 1519)     LOS: 3 days   Time spent: 35 mins   Clanford Wynetta Emery, MD How to contact the [facility] Attending or Consulting provider [place] or covering provider during after hours [place], for this patient?  1. Check the care team in [address] [facility] and look for a) attending/consulting [facility] provider listed and b) the [facility] team listed 2. Log into [email or web address] and use [place]'s universal password to access. If you do not have the password, please contact the hospital operator. 3. Locate the [facility] provider you are looking for under [facility] and page to a number that you can be directly reached. 4. If you still have difficulty reaching the provider, please page the [facility] (Director on Call) for the Hospitalists listed on amion for assistance.  09/30/2020, 12:04 PM

## 2020-09-30 NOTE — Anesthesia Preprocedure Evaluation (Signed)
Anesthesia Evaluation  Patient identified by MRN, date of birth, ID band Patient awake    Reviewed: Allergy & Precautions, H&P , NPO status , Patient's Chart, lab work & pertinent test results, reviewed documented beta blocker date and time   History of Anesthesia Complications (+) PROLONGED EMERGENCE and history of anesthetic complications  Airway Mallampati: II  TM Distance: >3 FB Neck ROM: full    Dental no notable dental hx.    Pulmonary pneumonia, resolved,    Pulmonary exam normal breath sounds clear to auscultation       Cardiovascular Exercise Tolerance: Good hypertension, + Peripheral Vascular Disease   Rhythm:regular Rate:Normal     Neuro/Psych  Headaches, PSYCHIATRIC DISORDERS Anxiety Depression    GI/Hepatic Neg liver ROS, GERD  Medicated,  Endo/Other  Hypothyroidism   Renal/GU negative Renal ROS  negative genitourinary   Musculoskeletal   Abdominal   Peds  Hematology negative hematology ROS (+)   Anesthesia Other Findings   Reproductive/Obstetrics negative OB ROS                             Anesthesia Physical Anesthesia Plan  ASA: II  Anesthesia Plan: General   Post-op Pain Management:    Induction:   PONV Risk Score and Plan: Ondansetron  Airway Management Planned:   Additional Equipment:   Intra-op Plan:   Post-operative Plan:   Informed Consent: I have reviewed the patients History and Physical, chart, labs and discussed the procedure including the risks, benefits and alternatives for the proposed anesthesia with the patient or authorized representative who has indicated his/her understanding and acceptance.     Dental Advisory Given  Plan Discussed with: CRNA  Anesthesia Plan Comments:         Anesthesia Quick Evaluation

## 2020-09-30 NOTE — Transfer of Care (Signed)
Immediate Anesthesia Transfer of Care Note  Patient: Ariel Henderson  Procedure(s) Performed: LAPAROSCOPIC CHOLECYSTECTOMY (N/A Abdomen)  Patient Location: PACU  Anesthesia Type:General  Level of Consciousness: awake, alert  and patient cooperative  Airway & Oxygen Therapy: Patient Spontanous Breathing and Patient connected to nasal cannula oxygen  Post-op Assessment: Report given to RN and Post -op Vital signs reviewed and stable  Post vital signs: Reviewed and stable  Last Vitals:  Vitals Value Taken Time  BP 114/57 09/30/20 1020  Temp 98.4   Pulse 76 09/30/20 1021  Resp 15 09/30/20 1021  SpO2 78 % 09/30/20 1021  Vitals shown include unvalidated device data.  Last Pain:  Vitals:   09/30/20 0800  TempSrc:   PainSc: 0-No pain      Patients Stated Pain Goal: 2 (74/14/23 9532)  Complications: No complications documented.

## 2020-09-30 NOTE — Anesthesia Procedure Notes (Signed)
Procedure Name: Intubation Date/Time: 09/30/2020 9:23 AM Performed by: Vista Deck, CRNA Pre-anesthesia Checklist: Patient identified, Patient being monitored, Timeout performed, Emergency Drugs available and Suction available Patient Re-evaluated:Patient Re-evaluated prior to induction Oxygen Delivery Method: Circle System Utilized Preoxygenation: Pre-oxygenation with 100% oxygen Induction Type: IV induction Laryngoscope Size: Mac and 3 Grade View: Grade II Tube type: Oral Tube size: 7.0 mm Number of attempts: 1 Airway Equipment and Method: stylet Placement Confirmation: ETT inserted through vocal cords under direct vision,  positive ETCO2 and breath sounds checked- equal and bilateral Secured at: 21 cm Tube secured with: Tape Dental Injury: Teeth and Oropharynx as per pre-operative assessment

## 2020-10-01 ENCOUNTER — Encounter (HOSPITAL_COMMUNITY): Payer: Self-pay | Admitting: General Surgery

## 2020-10-01 LAB — COMPREHENSIVE METABOLIC PANEL
ALT: 226 U/L — ABNORMAL HIGH (ref 0–44)
AST: 180 U/L — ABNORMAL HIGH (ref 15–41)
Albumin: 3 g/dL — ABNORMAL LOW (ref 3.5–5.0)
Alkaline Phosphatase: 122 U/L (ref 38–126)
Anion gap: 11 (ref 5–15)
BUN: 8 mg/dL (ref 8–23)
CO2: 24 mmol/L (ref 22–32)
Calcium: 8.2 mg/dL — ABNORMAL LOW (ref 8.9–10.3)
Chloride: 104 mmol/L (ref 98–111)
Creatinine, Ser: 0.75 mg/dL (ref 0.44–1.00)
GFR, Estimated: >60 mL/min (ref >60)
Glucose, Bld: 120 mg/dL — ABNORMAL HIGH (ref 70–99)
Potassium: 3.5 mmol/L (ref 3.5–5.1)
Sodium: 139 mmol/L (ref 135–145)
Total Bilirubin: 0.7 mg/dL (ref 0.3–1.2)
Total Protein: 5.9 g/dL — ABNORMAL LOW (ref 6.5–8.1)

## 2020-10-01 LAB — CBC
HCT: 33 % — ABNORMAL LOW (ref 36.0–46.0)
Hemoglobin: 10.3 g/dL — ABNORMAL LOW (ref 12.0–15.0)
MCH: 29.8 pg (ref 26.0–34.0)
MCHC: 31.2 g/dL (ref 30.0–36.0)
MCV: 95.4 fL (ref 80.0–100.0)
Platelets: 227 10*3/uL (ref 150–400)
RBC: 3.46 MIL/uL — ABNORMAL LOW (ref 3.87–5.11)
RDW: 14.2 % (ref 11.5–15.5)
WBC: 14.6 10*3/uL — ABNORMAL HIGH (ref 4.0–10.5)
nRBC: 0 % (ref 0.0–0.2)

## 2020-10-01 LAB — SURGICAL PATHOLOGY

## 2020-10-01 NOTE — Discharge Summary (Signed)
Physician Discharge Summary  Patient ID: Ariel Henderson MRN: [id number] DOB/AGE: 12-Aug-1946 75 y.o.  Admit date: 09/27/2020 Discharge date: 10/01/2020  Admission Diagnoses: Cholecystitis, cholelithiasis  Discharge Diagnoses: Same Active Problems:   HTN (hypertension)   Gastroesophageal reflux disease without esophagitis   Acute cholecystitis   Calculus of gallbladder with acute cholecystitis without obstruction Right renal cyst  Discharged Condition: good  Hospital Course: Patient is a 75 year old white female who presented to the emergency room with worsening right upper quadrant abdominal pain, nausea, and vomiting.  CT scan and ultrasound reports revealed acute cholecystitis with cholelithiasis.  Preoperatively, there was a question of an aneurysm of the left ventricle apex.  2D echo was negative.  She was also noted to have a large right renal cyst which will need to be worked up as an outpatient.  She underwent laparoscopic cholecystectomy on 09/30/2020.  She tolerated the procedure well.  Her postoperative course has been unremarkable.  Her diet was advanced out difficulty.  Her postoperative labs were consistent with her postcholecystectomy state.  She is being discharged home on 10/01/2020 in good and improving condition.  Treatments: surgery: Laparoscopic cholecystectomy on 09/30/2020  Discharge Exam: Blood pressure (!) 112/57, pulse 66, temperature 99.8 F (37.7 C), temperature source Oral, resp. rate 17, height 5\' 3"  (1.6 m), weight 78.5 kg, SpO2 90 %. General appearance: alert, cooperative and no distress Resp: clear to auscultation bilaterally Cardio: regular rate and rhythm, S1, S2 normal, no murmur, click, rub or gallop GI: Soft, incisions healing well.  Disposition: Discharge disposition: 01-Home or Self Care       Discharge Instructions    Diet - low sodium heart healthy   Complete by: As directed    Increase activity slowly   Complete by: As directed       Allergies as of 10/01/2020      Reactions   Aspirin Nausea Only, Other (See Comments)   Non coated CAN TAKE 81 MG       Medication List    TAKE these medications   benzonatate 100 MG capsule Commonly known as: TESSALON Take 1 capsule (100 mg total) by mouth every 8 (eight) hours.   buPROPion 150 MG 24 hr tablet Commonly known as: WELLBUTRIN XL Take 1 tablet (150 mg total) by mouth daily.   CALCIUM 1200 PO Take 1,000 mg by mouth daily.   citalopram 40 MG tablet Commonly known as: CELEXA Take 1 tablet (40 mg total) by mouth daily.   famotidine 20 MG tablet Commonly known as: PEPCID Take 20 mg by mouth daily.   fluticasone 50 MCG/ACT nasal spray Commonly known as: FLONASE Place 2 sprays into both nostrils daily.   levothyroxine 88 MCG tablet Commonly known as: SYNTHROID Take 1 tablet (88 mcg total) by mouth daily.   lovastatin 40 MG tablet Commonly known as: MEVACOR Take 1 tablet (40 mg total) by mouth at bedtime.   nitroGLYCERIN 0.4 MG SL tablet Commonly known as: NITROSTAT Place 1 tablet (0.4 mg total) under the tongue every 5 (five) minutes as needed for chest pain.   omeprazole 40 MG capsule Commonly known as: PRILOSEC Take 1 capsule (40 mg total) by mouth daily.   propranolol 40 MG tablet Commonly known as: INDERAL TAKE 1 TABLET BY MOUTH  DAILY   SUMAtriptan 100 MG tablet Commonly known as: IMITREX Take 1 tablet (100 mg total) by mouth every 2 (two) hours as needed for migraine. May repeat in 2 hours if headache persists or recurs.  Follow-up Information    Aviva Signs, MD Follow up.   Specialty: General Surgery Why: Will call you next week for follow up Contact information: [address] [place] [address] [phone]               Signed: Aviva Signs 10/01/2020, 8:35 AM

## 2020-10-01 NOTE — Discharge Instructions (Signed)
Minimally Invasive Cholecystectomy, Care After This sheet gives you information about how to care for yourself after your procedure. Your doctor may also give you more specific instructions. If you have problems or questions, contact your doctor. What can I expect after the procedure? After the procedure, it is common:  To have pain at the areas of surgery. You will be given medicines for pain.  To vomit or feel like you may vomit.  To feel fullness in the belly (bloating) or to have pain in the shoulder. This comes from the gas that was used during the surgery. Follow these instructions at home: Medicines  Take over-the-counter and prescription medicines only as told by your doctor.  If you were prescribed an antibiotic medicine, take it as told by your doctor. Do not stop using the antibiotic even if you start to feel better.  Ask your doctor if the medicine prescribed to you: ? Requires you to avoid driving or using machinery. ? Can cause trouble pooping (constipation). You may need to take these actions to prevent or treat trouble pooping:  Drink enough fluid to keep your pee (urine) pale yellow.  Take over-the-counter or prescription medicines.  Eat foods that are high in fiber. These include beans, whole grains, and fresh fruits and vegetables.  Limit foods that are high in fat and sugar. These include fried or sweet foods. Incision care  Follow instructions from your doctor about how to take care of your cuts from surgery (incisions). Make sure you: ? Wash your hands with soap and water for at least 20 seconds before and after you change your bandage (dressing). If you cannot use soap and water, use hand sanitizer. ? Change your bandage as told by your doctor. ? Leave stitches (sutures), skin glue, or skin tape (adhesive) strips in place. They may need to stay in place for 2 weeks or longer. If tape strips get loose and curl up, you may trim the loose edges. Do not remove tape  strips completely unless your doctor says it is okay.  Do not take baths, swim, or use a hot tub until your doctor approves. Ask your doctor if you may take showers. You may only be allowed to take sponge baths.  Check your surgery area every day for signs of infection. Check for: ? More redness, swelling, or pain. ? Fluid or blood. ? Warmth. ? Pus or a bad smell.   Activity  Rest as told by your doctor.  Do not sit for a long time without moving. Get up to take short walks every 1-2 hours. This is important. Ask for help if you feel weak or unsteady.  Do not lift anything that is heavier than 10 lb (4.5 kg), or the limit that you are told, until your doctor says that it is safe.  Do not play contact sports until your doctor says it is okay.  Do not return to work or school until your doctor says it is okay.  Return to your normal activities as told by your doctor. Ask your doctor what activities are safe for you. General instructions  If you were given a medicine to help you relax (sedative) during your procedure, it can affect you for many hours. Do not drive or use machinery until your doctor says that it is safe.  Keep all follow-up visits as told by your doctor. This is important. Contact a doctor if:  You get a rash.  You have more redness, swelling, or pain around   your cuts from surgery.  You have fluid or blood coming from your cuts from surgery.  Your cuts from surgery feel warm to the touch.  You have pus or a bad smell coming from your cuts from surgery.  You have a fever.  One or more of your cuts from surgery breaks open. Get help right away if:  You have trouble breathing.  You have chest pain.  You have pain that is getting worse in your shoulders.  You faint or feel dizzy when you stand.  You have very bad pain in your belly (abdomen).  You feel like you may vomit or you vomit, and this lasts for more than one day.  You have leg  pain. Summary  After your surgery, it is common to have pain at the areas of surgery. You may also have vomiting or fullness in the belly.  Follow your doctor's instructions about medicine, activity restrictions, and caring for your surgery areas. Do not do activities that require a lot of effort.  Contact a doctor if you have a fever or other signs of infection, such as more redness, swelling, or pain around the cuts from surgery.  Get help right away if you have chest pain, increasing pain in the shoulders, or trouble breathing. This information is not intended to replace advice given to you by your health care provider. Make sure you discuss any questions you have with your health care provider. Document Revised: 08/08/2019 Document Reviewed: 08/08/2019 Elsevier Patient Education  2021 Elsevier Inc.  

## 2020-10-01 NOTE — Progress Notes (Addendum)
Review chart.  Patient was here for cholecystitis and underwent laparoscopic cholecystectomy with postoperative day 1 today.  Patient has already been discharged by Dr. Arnoldo Morale.  Patient has no complaints.  Vitals are stable.  Patient has mild leukocytosis but she is afebrile.  Per Dr. Arnoldo Morale, this is expected as postcholecystectomy state.

## 2020-10-01 NOTE — Progress Notes (Signed)
Nsg Discharge Note  Admit Date:  09/27/2020 Discharge date: 10/01/2020   Ambrose [place] Boye to be D/C'd Home per MD order.  AVS completed.  Copy for chart, and copy for patient signed, and dated. Patient/caregiver able to verbalize understanding.  Discharge Medication: Allergies as of 10/01/2020      Reactions   Aspirin Nausea Only, Other (See Comments)   Non coated CAN TAKE 81 MG       Medication List    TAKE these medications   benzonatate 100 MG capsule Commonly known as: TESSALON Take 1 capsule (100 mg total) by mouth every 8 (eight) hours.   buPROPion 150 MG 24 hr tablet Commonly known as: WELLBUTRIN XL Take 1 tablet (150 mg total) by mouth daily.   CALCIUM 1200 PO Take 1,000 mg by mouth daily.   citalopram 40 MG tablet Commonly known as: CELEXA Take 1 tablet (40 mg total) by mouth daily.   famotidine 20 MG tablet Commonly known as: PEPCID Take 20 mg by mouth daily.   fluticasone 50 MCG/ACT nasal spray Commonly known as: FLONASE Place 2 sprays into both nostrils daily.   levothyroxine 88 MCG tablet Commonly known as: SYNTHROID Take 1 tablet (88 mcg total) by mouth daily.   lovastatin 40 MG tablet Commonly known as: MEVACOR Take 1 tablet (40 mg total) by mouth at bedtime.   nitroGLYCERIN 0.4 MG SL tablet Commonly known as: NITROSTAT Place 1 tablet (0.4 mg total) under the tongue every 5 (five) minutes as needed for chest pain.   omeprazole 40 MG capsule Commonly known as: PRILOSEC Take 1 capsule (40 mg total) by mouth daily.   propranolol 40 MG tablet Commonly known as: INDERAL TAKE 1 TABLET BY MOUTH  DAILY   SUMAtriptan 100 MG tablet Commonly known as: IMITREX Take 1 tablet (100 mg total) by mouth every 2 (two) hours as needed for migraine. May repeat in 2 hours if headache persists or recurs.       Discharge Assessment: Vitals:   10/01/20 0216 10/01/20 0536  BP: (!) 107/51 (!) 112/57  Pulse: 66 66  Resp: 17 17  Temp: 99.4 F (37.4 C) 99.8 F  (37.7 C)  SpO2: 90% 90%   Skin clean, dry and intact without evidence of skin break down, no evidence of skin tears noted. IV catheter discontinued intact. Site without signs and symptoms of complications - no redness or edema noted at insertion site, patient denies c/o pain - only slight tenderness at site.  Dressing with slight pressure applied.  D/c Instructions-Education: Discharge instructions given to patient/family with verbalized understanding. D/c education completed with patient/family including follow up instructions, medication list, d/c activities limitations if indicated, with other d/c instructions as indicated by MD - patient able to verbalize understanding, all questions fully answered. Patient instructed to return to ED, call 911, or call MD for any changes in condition.  Patient escorted via [place], and D/C home via private auto.  Dorcas Mcmurray, LPN 3/88/8280 03:49 AM

## 2020-10-08 ENCOUNTER — Encounter: Payer: Self-pay | Admitting: Family Medicine

## 2020-10-08 ENCOUNTER — Other Ambulatory Visit: Payer: Self-pay

## 2020-10-08 ENCOUNTER — Ambulatory Visit (INDEPENDENT_AMBULATORY_CARE_PROVIDER_SITE_OTHER): Payer: Medicare Other | Admitting: Family Medicine

## 2020-10-08 VITALS — BP 162/84 | HR 66 | Temp 99.1°F | Wt 180.4 lb

## 2020-10-08 DIAGNOSIS — N2889 Other specified disorders of kidney and ureter: Secondary | ICD-10-CM

## 2020-10-08 DIAGNOSIS — F329 Major depressive disorder, single episode, unspecified: Secondary | ICD-10-CM

## 2020-10-08 DIAGNOSIS — N281 Cyst of kidney, acquired: Secondary | ICD-10-CM

## 2020-10-08 DIAGNOSIS — Z9049 Acquired absence of other specified parts of digestive tract: Secondary | ICD-10-CM

## 2020-10-08 NOTE — Addendum Note (Signed)
Addended by: Denita Lung on: 10/08/2020 04:50 PM   Modules accepted: Orders

## 2020-10-08 NOTE — Patient Instructions (Signed)
Call Hospice

## 2020-10-08 NOTE — Progress Notes (Addendum)
   Subjective:    Patient ID: Ariel Henderson, female    DOB: 18-Dec-1945, 75 y.o.   MRN: [id number]  HPI She is here for a follow-up visit after recent hospitalization and surgery for acute cholecystitis and subsequent laparoscopic cholecystectomy.  She did quite nicely on that and presently is having no abdominal pain, nausea, vomiting.  During the evaluation of a renal cyst was noted.  She is also dealing with the fact that her daughter died of Covid in early April 30, 2023 and she is still having difficulty dealing with that.  She apparently was also a liver transplant patient.   Review of Systems     Objective:   Physical Exam Alert and in no distress.  Abdominal exam does show some discoloration and recent surgical scars.  No tenderness to palpation.       Assessment & Plan:  S/P laparoscopic cholecystectomy  Reactive depression  Renal cyst At this point I think she has recovered nicely from her cholecystectomy. I then discussed the fact that going to hospice to get bereavement counseling should help dealing with her daughter's death. We will follow up with radiology concerning any further work-up for the renal cyst. We will order MRI with and without contrast.

## 2020-10-11 ENCOUNTER — Encounter: Payer: Self-pay | Admitting: General Surgery

## 2020-10-11 ENCOUNTER — Telehealth (INDEPENDENT_AMBULATORY_CARE_PROVIDER_SITE_OTHER): Payer: Medicare Other | Admitting: General Surgery

## 2020-10-11 DIAGNOSIS — Z09 Encounter for follow-up examination after completed treatment for conditions other than malignant neoplasm: Secondary | ICD-10-CM

## 2020-10-11 NOTE — Progress Notes (Signed)
Subjective:     Ariel Henderson  Virtual telephone postoperative visit performed with the patient.  I was in my office and she was at home.  She states she is doing very well from the surgery.  She was incidentally found to have a left kidney cyst.  She states that she has already been scheduled for an MRI to work that up through her primary care physician. Objective:    There were no vitals taken for this visit.  General:  alert, cooperative and no distress  Final pathology consistent with diagnosis.     Assessment:    Doing well postoperatively.    Plan:   Patient was instructed to call me should any problems arise.  Total telephone time was 2 minutes.  As this is a postoperative visit, this is not billable as it is part of the global fee.

## 2020-10-28 ENCOUNTER — Ambulatory Visit
Admission: RE | Admit: 2020-10-28 | Discharge: 2020-10-28 | Disposition: A | Discharge status: Home / Self Care | Payer: Medicare Other | Source: Ambulatory Visit | Attending: Family Medicine | Admitting: Family Medicine

## 2020-10-28 DIAGNOSIS — K7689 Other specified diseases of liver: Secondary | ICD-10-CM | POA: Diagnosis not present

## 2020-10-28 DIAGNOSIS — K449 Diaphragmatic hernia without obstruction or gangrene: Secondary | ICD-10-CM | POA: Diagnosis not present

## 2020-10-28 DIAGNOSIS — Z9049 Acquired absence of other specified parts of digestive tract: Secondary | ICD-10-CM | POA: Diagnosis not present

## 2020-10-28 DIAGNOSIS — N281 Cyst of kidney, acquired: Secondary | ICD-10-CM | POA: Diagnosis not present

## 2020-10-28 MED ORDER — GADOBENATE DIMEGLUMINE 529 MG/ML IV SOLN
17.0000 mL | Freq: Once | INTRAVENOUS | Status: AC | PRN
  Administered 2020-10-28: 17 mL via INTRAVENOUS

## 2021-04-08 ENCOUNTER — Ambulatory Visit
Admission: RE | Admit: 2021-04-08 | Discharge: 2021-04-08 | Disposition: A | Discharge status: Home / Self Care | Payer: Medicare Other | Source: Ambulatory Visit | Attending: Hematology and Oncology | Admitting: Hematology and Oncology

## 2021-04-08 ENCOUNTER — Other Ambulatory Visit: Payer: Self-pay

## 2021-04-08 ENCOUNTER — Other Ambulatory Visit: Payer: Self-pay | Admitting: Hematology and Oncology

## 2021-04-08 DIAGNOSIS — Z1231 Encounter for screening mammogram for malignant neoplasm of breast: Secondary | ICD-10-CM | POA: Diagnosis not present

## 2021-04-08 DIAGNOSIS — D0512 Intraductal carcinoma in situ of left breast: Secondary | ICD-10-CM

## 2021-05-30 ENCOUNTER — Ambulatory Visit: Payer: Medicare Other | Admitting: Family Medicine

## 2021-06-06 ENCOUNTER — Ambulatory Visit (INDEPENDENT_AMBULATORY_CARE_PROVIDER_SITE_OTHER): Payer: Medicare Other | Admitting: Family Medicine

## 2021-06-06 ENCOUNTER — Encounter: Payer: Self-pay | Admitting: Family Medicine

## 2021-06-06 ENCOUNTER — Other Ambulatory Visit: Payer: Self-pay

## 2021-06-06 VITALS — BP 144/80 | HR 88 | Temp 99.2°F | Ht 63.0 in | Wt 170.6 lb

## 2021-06-06 DIAGNOSIS — Z23 Encounter for immunization: Secondary | ICD-10-CM

## 2021-06-06 DIAGNOSIS — Z0000 Encounter for general adult medical examination without abnormal findings: Secondary | ICD-10-CM | POA: Diagnosis not present

## 2021-06-06 DIAGNOSIS — Z800 Family history of malignant neoplasm of digestive organs: Secondary | ICD-10-CM

## 2021-06-06 DIAGNOSIS — C439 Malignant melanoma of skin, unspecified: Secondary | ICD-10-CM

## 2021-06-06 DIAGNOSIS — D0512 Intraductal carcinoma in situ of left breast: Secondary | ICD-10-CM

## 2021-06-06 DIAGNOSIS — Z85850 Personal history of malignant neoplasm of thyroid: Secondary | ICD-10-CM | POA: Diagnosis not present

## 2021-06-06 DIAGNOSIS — J309 Allergic rhinitis, unspecified: Secondary | ICD-10-CM | POA: Diagnosis not present

## 2021-06-06 DIAGNOSIS — I10 Essential (primary) hypertension: Secondary | ICD-10-CM | POA: Diagnosis not present

## 2021-06-06 DIAGNOSIS — Z9049 Acquired absence of other specified parts of digestive tract: Secondary | ICD-10-CM

## 2021-06-06 DIAGNOSIS — F320 Major depressive disorder, single episode, mild: Secondary | ICD-10-CM | POA: Diagnosis not present

## 2021-06-06 DIAGNOSIS — M1990 Unspecified osteoarthritis, unspecified site: Secondary | ICD-10-CM

## 2021-06-06 DIAGNOSIS — Z13820 Encounter for screening for osteoporosis: Secondary | ICD-10-CM

## 2021-06-06 DIAGNOSIS — E785 Hyperlipidemia, unspecified: Secondary | ICD-10-CM

## 2021-06-06 DIAGNOSIS — F329 Major depressive disorder, single episode, unspecified: Secondary | ICD-10-CM

## 2021-06-06 MED ORDER — LEVOTHYROXINE SODIUM 88 MCG PO TABS: 88.0000 ug | ORAL_TABLET | Freq: Every day | ORAL | 3 refills | Status: DC

## 2021-06-06 MED ORDER — PROPRANOLOL HCL 40 MG PO TABS: 40.0000 mg | ORAL_TABLET | Freq: Every day | ORAL | 3 refills | Status: DC

## 2021-06-06 MED ORDER — BUPROPION HCL ER (XL) 150 MG PO TB24: 150.0000 mg | ORAL_TABLET | Freq: Every day | ORAL | 3 refills | Status: DC

## 2021-06-06 MED ORDER — NITROGLYCERIN 0.4 MG SL SUBL: 0.4000 mg | SUBLINGUAL_TABLET | SUBLINGUAL | 6 refills | Status: AC | PRN

## 2021-06-06 MED ORDER — CITALOPRAM HYDROBROMIDE 40 MG PO TABS: 40.0000 mg | ORAL_TABLET | Freq: Every day | ORAL | 3 refills | Status: DC

## 2021-06-06 MED ORDER — LOVASTATIN 40 MG PO TABS: 40.0000 mg | ORAL_TABLET | Freq: Every day | ORAL | 3 refills | Status: DC

## 2021-06-06 NOTE — Progress Notes (Signed)
Ariel Henderson is a 75 y.o. female who presents for annual wellness visit ,CPE and follow-up on chronic medical conditions.  She has had recent gallbladder surgery and seems to be doing quite nicely from that.  She has also had a recent mammogram.  She continues on Celexa and seems to be doing fairly well on it.  She does have occasional bad days.  She is taking Pepcid for her reflux.  Continues on thyroid medication.  Also her allergies seem to be under good control.  She does have history of headaches usually 1 or 2 every other month.  She responds well to Imitrex.  She is on Inderal and using this probably for headache as well as for her blood pressure.  She follows up regularly concerning her breast cancer.  She also has a history of melanoma but has not followed up with dermatology.  She has also not had a colonoscopy in over 10 years.  She does occasionally complain of difficulty with arthritis.  Immunizations and Health Maintenance Immunization History  Administered Date(s) Administered   Fluad Quad(high Dose 65+) 08/15/2019, 05/28/2020, 06/06/2021   Influenza Split 04/17/2013, 06/28/2013, 05/11/2014   Influenza, High Dose Seasonal PF 05/03/2017, 05/31/2018   Influenza-Unspecified 07/23/2010, 05/22/2011, 05/13/2012   PFIZER(Purple Top)SARS-COV-2 Vaccination 05/28/2020, 06/20/2020   Pneumococcal Conjugate-13 09/28/2013, 01/19/2015   Pneumococcal Polysaccharide-23 10/23/2010   Td 02/13/2002, 04/27/2012   Zoster Recombinat (Shingrix) 12/17/2016, 02/26/2017   Zoster, Live 10/28/2011   Health Maintenance Due  Topic Date Due   DEXA SCAN  Never done   COVID-19 Vaccine (3 - Pfizer risk series) 07/18/2020    Last Pap smear: aged out Last mammogram: 04/08/21 Last colonoscopy: 1996 Pt declines another one Last DEXA: never  Dentist: q six months  Ophtho: over to years Exercise: walking QD  Other doctors caring for patient include: N/A  Advanced directives: Does Patient Have a Medical  Advance Directive?: Yes Type of Advance Directive: Living will Does patient want to make changes to medical advance directive?: No - Patient declined  Depression screen:  See questionnaire below.  Depression screen [facility] 2/9 06/06/2021 05/28/2020 01/05/2019 09/19/2018 08/08/2018  Decreased Interest 0 0 0 0 2  Down, Depressed, Hopeless 1 1 1 1 1   PHQ - 2 Score 1 1 1 1 3   Altered sleeping - - - 3 2  Tired, decreased energy - - - 0 0  Change in appetite - - - 0 0  Feeling bad or failure about yourself  - - - 0 0  Trouble concentrating - - - 1 3  Moving slowly or fidgety/restless - - - 0 0  Suicidal thoughts - - - 0 0  PHQ-9 Score - - - 5 8  Difficult doing work/chores - - - Somewhat difficult Somewhat difficult    Fall Risk Screen: see questionnaire below. Fall Risk  06/06/2021 05/28/2020 01/05/2019 06/23/2017 03/16/2017  Falls in the past year? 0 0 0 No No  Number falls in past yr: 0 - - - -  Injury with Fall? 0 - - - -  Risk for fall due to : No Fall Risks - - - -    ADL screen:  See questionnaire below Functional Status Survey: Is the patient deaf or have difficulty hearing?: No Does the patient have difficulty seeing, even when wearing glasses/contacts?: No Does the patient have difficulty concentrating, remembering, or making decisions?: No Does the patient have difficulty walking or climbing stairs?: Yes (knees) Does the patient have difficulty dressing or  bathing?: No Does the patient have difficulty doing errands alone such as visiting a doctor's office or shopping?: No   Review of Systems Constitutional: -, -unexpected weight change, -anorexia, -fatigue Allergy: -sneezing, -itching, -congestion Dermatology: denies changing moles, rash, lumps ENT: -runny nose, -ear pain, -sore throat,  Cardiology:  -chest pain, -palpitations, -orthopnea, Respiratory: -cough, -shortness of breath, -dyspnea on exertion, -wheezing,  Gastroenterology: -abdominal pain, -nausea, -vomiting, -diarrhea,  -constipation, -dysphagia Hematology: -bleeding or bruising problems Musculoskeletal: -arthralgias, -myalgias, -joint swelling, -back pain, - Ophthalmology: -vision changes,  Urology: -dysuria, -difficulty urinating,  -urinary frequency, -urgency, incontinence Neurology: -, -numbness, , -memory loss, -falls, -dizziness    PHYSICAL EXAM:  BP (!) 144/80   Pulse 88   Temp 99.2 F (37.3 C)   Ht 5\' 3"  (1.6 m)   Wt 170 lb 9.6 oz (77.4 kg)   SpO2 97%   BMI 30.22 kg/m   General Appearance: Alert, cooperative, no distress, appears stated age Head: Normocephalic, without obvious abnormality, atraumatic Eyes: PERRL, conjunctiva/corneas clear, EOM's intact, Ears: Normal TM's and external ear canals Nose: Nares normal, mucosa normal, no drainage or sinus tenderness Throat: Lips, mucosa, and tongue normal; teeth and gums normal Neck: Supple, no lymphadenopathy;  thyroid:  no enlargement/tenderness/nodules; no carotid bruit or JVD Lungs: Clear to auscultation bilaterally without wheezes, rales or ronchi; respirations unlabored Heart: Regular rate and rhythm, S1 and S2 normal, no murmur, rubor gallop Abdomen: Soft, non-tender, nondistended, normoactive bowel sounds,  no masses, no hepatosplenomegaly Extremities: No clubbing, cyanosis or edema Pulses: 2+ and symmetric all extremities Skin:  Skin color, texture, turgor normal, no rashes or lesions Lymph nodes: Cervical, supraclavicular, and axillary nodes normal Neurologic:  CNII-XII intact, normal strength, sensation and gait; reflexes 2+ and symmetric throughout Psych: Normal mood, affect, hygiene and grooming.  ASSESSMENT/PLAN: Routine general medical examination at a health care facility - Plan: CBC with Differential/Platelet, Comprehensive metabolic panel  Need for influenza vaccination - Plan: Flu Vaccine QUAD High Dose(Fluad), CANCELED: Flu vaccine HIGH DOSE PF (Fluzone High dose)  Depression, major, single episode, mild  (HCC)  Melanoma of skin ([place])  History of thyroid cancer - Plan: levothyroxine (SYNTHROID) 88 MCG tablet  Ductal carcinoma in situ (DCIS) of left breast  Family history of malignant neoplasm of gastrointestinal tract - Plan: Ambulatory referral to Gastroenterology  Allergic rhinitis, unspecified seasonality, unspecified trigger  Arthritis  Primary hypertension - Plan: propranolol (INDERAL) 40 MG tablet  Hyperlipidemia, unspecified hyperlipidemia type - Plan: Lipid panel, lovastatin (MEVACOR) 40 MG tablet, DISCONTINUED: lovastatin (MEVACOR) 40 MG tablet  Screening for osteoporosis - Plan: DG Bone Density  Reactive depression - Plan: buPROPion (WELLBUTRIN XL) 150 MG 24 hr tablet, citalopram (CELEXA) 40 MG tablet I did discuss the use of Celexa and at this point I think it is appropriate for her to continue on it.  She will also check with her dermatologist and get back in touch with them concerning appropriate following up for her melanoma.  Continue to be followed by oncology for the breast cancer.   Discussed monthly self breast exams and yearly mammograms; at least 30 minutes of aerobic activity at least 5 days/week and weight-bearing exercise 2x/week; proper sunscreen use reviewed; healthy diet, including goals of calcium and vitamin D intake and alcohol recommendations (less than or equal to 1 drink/day) reviewed; regular seatbelt use; changing batteries in smoke detectors.  Immunization recommendations discussed.  Colonoscopy recommendations reviewed   Medicare Attestation I have personally reviewed: The patient's medical and social history Their use of alcohol,  tobacco or illicit drugs Their current medications and supplements The patient's functional ability including ADLs,fall risks, home safety risks, cognitive, and hearing and visual impairment Diet and physical activities Evidence for depression or mood disorders  The patient's weight, height, and BMI have been recorded  in the chart.  I have made referrals, counseling, and provided education to the patient based on review of the above and I have provided the patient with a written personalized care plan for preventive services.     Jill Alexanders, MD   06/06/2021

## 2021-06-07 LAB — COMPREHENSIVE METABOLIC PANEL
ALT: 12 IU/L (ref 0–32)
AST: 20 IU/L (ref 0–40)
Albumin/Globulin Ratio: 1.9 (ref 1.2–2.2)
Albumin: 4.5 g/dL (ref 3.7–4.7)
Alkaline Phosphatase: 61 IU/L (ref 44–121)
BUN/Creatinine Ratio: 14 (ref 12–28)
BUN: 10 mg/dL (ref 8–27)
Bilirubin Total: 0.3 mg/dL (ref 0.0–1.2)
CO2: 24 mmol/L (ref 20–29)
Calcium: 9.3 mg/dL (ref 8.7–10.3)
Chloride: 107 mmol/L — ABNORMAL HIGH (ref 96–106)
Creatinine, Ser: 0.69 mg/dL (ref 0.57–1.00)
Globulin, Total: 2.4 g/dL (ref 1.5–4.5)
Glucose: 105 mg/dL — ABNORMAL HIGH (ref 70–99)
Potassium: 5 mmol/L (ref 3.5–5.2)
Sodium: 145 mmol/L — ABNORMAL HIGH (ref 134–144)
Total Protein: 6.9 g/dL (ref 6.0–8.5)
eGFR: 90 mL/min/{1.73_m2} (ref >59)

## 2021-06-07 LAB — CBC WITH DIFFERENTIAL/PLATELET
Basophils Absolute: 0.1 10*3/uL (ref 0.0–0.2)
Basos: 1 %
EOS (ABSOLUTE): 0.2 10*3/uL (ref 0.0–0.4)
Eos: 2 %
Hematocrit: 37.1 % (ref 34.0–46.6)
Hemoglobin: 12.3 g/dL (ref 11.1–15.9)
Immature Grans (Abs): 0.1 10*3/uL (ref 0.0–0.1)
Immature Granulocytes: 1 %
Lymphocytes Absolute: 2.7 10*3/uL (ref 0.7–3.1)
Lymphs: 33 %
MCH: 29.6 pg (ref 26.6–33.0)
MCHC: 33.2 g/dL (ref 31.5–35.7)
MCV: 89 fL (ref 79–97)
Monocytes Absolute: 0.6 10*3/uL (ref 0.1–0.9)
Monocytes: 7 %
Neutrophils Absolute: 4.7 10*3/uL (ref 1.4–7.0)
Neutrophils: 56 %
Platelets: 290 10*3/uL (ref 150–450)
RBC: 4.16 x10E6/uL (ref 3.77–5.28)
RDW: 13.1 % (ref 11.7–15.4)
WBC: 8.3 10*3/uL (ref 3.4–10.8)

## 2021-06-07 LAB — LIPID PANEL
Chol/HDL Ratio: 3.8 ratio (ref 0.0–4.4)
Cholesterol, Total: 200 mg/dL — ABNORMAL HIGH (ref 100–199)
HDL: 52 mg/dL (ref >39)
LDL Chol Calc (NIH): 127 mg/dL — ABNORMAL HIGH (ref 0–99)
Triglycerides: 117 mg/dL (ref 0–149)
VLDL Cholesterol Cal: 21 mg/dL (ref 5–40)

## 2021-09-03 DIAGNOSIS — J0100 Acute maxillary sinusitis, unspecified: Secondary | ICD-10-CM | POA: Diagnosis not present

## 2021-09-03 DIAGNOSIS — R051 Acute cough: Secondary | ICD-10-CM | POA: Diagnosis not present

## 2021-09-19 ENCOUNTER — Other Ambulatory Visit: Payer: Self-pay

## 2021-09-19 ENCOUNTER — Telehealth: Payer: Self-pay

## 2021-09-19 DIAGNOSIS — I739 Peripheral vascular disease, unspecified: Secondary | ICD-10-CM

## 2021-09-19 NOTE — Progress Notes (Signed)
Patient Care Team: Denita Lung, MD as PCP - General (Family Medicine)  DIAGNOSIS:    ICD-10-CM   1. 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  The Comprehensive Common Cancer Panel offered by [facility] includes sequencing and/or deletion duplication testing of the following 29 genes: APC, ATM, AXIN2, BARD1, BMPR1A, BRCA1, BRCA2, BRIP1, CDH1, CDK4, CDKN2A, CHEK2, EPCAM, FANCC, MLH1, MSH2, MSH6, MUTYH, NBN, PALB2, PMS2, PTEN, RAD51C, RAD51D, SMAD4, STK11, TP53, and VHL.   The report date is February 22, 2013.  UPDATE: VHL c.5C>G (p.Pro2Leu) has been reclassified from a variant of uncertain significance to a likely benign variant based on a combination of sources, e.g., internal data, published literature, population databases and in silico models.  The updated report date is March 30, 2017.    03/27/2013 -  Anti-estrogen oral therapy   Tamoxifen 20 mg daily     CHIEF COMPLIANT: Follow-up of left breast DCIS   INTERVAL HISTORY: Ariel Henderson is a 76 y.o. with above-mentioned history of  left breast DCIS treated with mastectomy and who completed tamoxifen in 2019. Mammogram on 04/08/2021 showed no evidence of malignancy. She presents to the clinic today for follow-up.  She is doing quite well without any problems or concerns.  Denies any lumps or nodules in the breast.  Very occasionally beneath the left breast implant she may have a mild discomfort.  ALLERGIES:  is allergic to  aspirin.  MEDICATIONS:  Current Outpatient Medications  Medication Sig Dispense Refill   benzonatate (TESSALON) 100 MG capsule Take 1 capsule (100 mg total) by mouth every 8 (eight) hours. (Patient not taking: Reported on 06/06/2021) 21 capsule 0   buPROPion (WELLBUTRIN XL) 150 MG 24 hr tablet Take 1 tablet (150 mg total) by mouth daily. 90 tablet 3   Calcium Carbonate-Vit D-Min (CALCIUM 1200 PO) Take 1,000 mg by mouth daily.      citalopram (CELEXA) 40 MG tablet Take 1 tablet (40 mg total) by mouth daily. 90 tablet 3   famotidine (PEPCID) 20 MG tablet Take 20 mg by mouth daily.     fluticasone (FLONASE) 50 MCG/ACT nasal spray Place 2 sprays into both nostrils daily. 1 g 0   levothyroxine (SYNTHROID) 88 MCG tablet Take 1 tablet (88 mcg total) by mouth daily. 90 tablet 3   lovastatin (MEVACOR) 40 MG tablet Take 1 tablet (40 mg total) by mouth at bedtime. 90 tablet 3   nitroGLYCERIN (NITROSTAT) 0.4 MG SL tablet Place 1 tablet (0.4 mg total) under the tongue every 5 (five) minutes as needed for chest pain. 25 tablet 6   propranolol (INDERAL) 40 MG tablet Take 1 tablet (40 mg total) by mouth daily. 90 tablet 3   SUMAtriptan (IMITREX) 100 MG tablet Take 1 tablet (100 mg total) by mouth every 2 (two) hours as needed for migraine. May repeat in 2 hours if headache persists or recurs. 10 tablet 5   No current facility-administered medications for this visit.  PHYSICAL EXAMINATION: ECOG PERFORMANCE STATUS: 1 - Symptomatic but completely ambulatory  Vitals:   09/22/21 1126  BP: 137/80  Pulse: 70  Resp: 18  Temp: (!) 97.2 F (36.2 C)  SpO2: 99%   Filed Weights   09/22/21 1126  Weight: 175 lb 9.6 oz (79.7 kg)    BREAST: Prior left mastectomy with reconstruction, no palpable lumps or nodules (exam performed in the presence of a chaperone)  LABORATORY DATA:  I have reviewed the data as listed CMP Latest Ref Rng & Units 06/06/2021 10/01/2020 09/29/2020  Glucose 70 - 99 mg/dL 105(H) 120(H) 96   BUN 8 - 27 mg/dL 10 8 10   Creatinine 0.57 - 1.00 mg/dL 0.69 0.75 0.81  Sodium 134 - 144 mmol/L 145(H) 139 137  Potassium 3.5 - 5.2 mmol/L 5.0 3.5 4.0  Chloride 96 - 106 mmol/L 107(H) 104 106  CO2 20 - 29 mmol/L 24 24 24   Calcium 8.7 - 10.3 mg/dL 9.3 8.2(L) 8.4(L)  Total Protein 6.0 - 8.5 g/dL 6.9 5.9(L) 5.9(L)  Total Bilirubin 0.0 - 1.2 mg/dL 0.3 0.7 0.5  Alkaline Phos 44 - 121 IU/L 61 122 57  AST 0 - 40 IU/L 20 180(H) 43(H)  ALT 0 - 32 IU/L 12 226(H) 82(H)    Lab Results  Component Value Date   WBC 8.3 06/06/2021   HGB 12.3 06/06/2021   HCT 37.1 06/06/2021   MCV 89 06/06/2021   PLT 290 06/06/2021   NEUTROABS 4.7 06/06/2021    ASSESSMENT & PLAN:  Ductal carcinoma in situ (DCIS) of left breast DCIS left breast diagnosed in 2014 status post mastectomy after the initial lumpectomy had positive margins. She underwent breast reconstruction. Tamoxifen July 2014-July 2019   Breast Cancer Surveillance: 1. Breast exam 09/22/2021: Normal , tenderness in the left axilla. No palpable nodules or lumps. 2. Rt Mammogram 04/11/2021: Benign, Postsurgical changes. Breast Density Category B.   RTC in 1 year for follow-up with long-term survivorship with Mendel Ryder.   No orders of the defined types were placed in this encounter.  The patient has a good understanding of the overall plan. she agrees with it. she will call with any problems that may develop before the next visit here.  Total time spent: 20 mins including face to face time and time spent for planning, charting and coordination of care  Rulon Eisenmenger, MD, MPH 09/22/2021  I, Thana Ates, am acting as scribe for Dr. Nicholas Lose.  I have reviewed the above documentation for accuracy and completeness, and I agree with the above.

## 2021-09-19 NOTE — Telephone Encounter (Signed)
Done KH 

## 2021-09-19 NOTE — Progress Notes (Signed)
[facility]

## 2021-09-19 NOTE — Telephone Encounter (Signed)
House call nurse wanted to advise  of pt PAD results. Left foot was 0.68 and right foot was 0.45 and she advised that the results will be sent to our office. [place]

## 2021-09-21 NOTE — Assessment & Plan Note (Signed)
DCIS left breast diagnosed in 2014 status post mastectomy after the initial lumpectomy had positive margins. She underwent breast reconstruction.Tamoxifen July 2014-July 2019  Patient's daughter passed away from COVID-19 (she had kidney transplant previously)  Breast Cancer Surveillance: 1. Breast exam1/16/2023: Normal , tenderness in the left axilla. No palpable nodules or lumps. 2.RtMammogram8/01/2021: Benign, Postsurgical changes. Breast Density CategoryB.  RTC in 1 year with labs.

## 2021-09-22 ENCOUNTER — Inpatient Hospital Stay: Payer: Medicare Other | Attending: Hematology and Oncology | Admitting: Hematology and Oncology

## 2021-09-22 ENCOUNTER — Other Ambulatory Visit: Payer: Self-pay

## 2021-09-22 DIAGNOSIS — Z9012 Acquired absence of left breast and nipple: Secondary | ICD-10-CM | POA: Diagnosis not present

## 2021-09-22 DIAGNOSIS — D0512 Intraductal carcinoma in situ of left breast: Secondary | ICD-10-CM

## 2021-09-22 DIAGNOSIS — Z853 Personal history of malignant neoplasm of breast: Secondary | ICD-10-CM | POA: Diagnosis not present

## 2021-10-09 ENCOUNTER — Encounter: Payer: Self-pay | Admitting: Family Medicine

## 2021-11-25 ENCOUNTER — Inpatient Hospital Stay: Admission: RE | Admit: 2021-11-25 | Payer: Medicare Other | Source: Ambulatory Visit

## 2021-12-16 DIAGNOSIS — M25561 Pain in right knee: Secondary | ICD-10-CM | POA: Diagnosis not present

## 2021-12-16 DIAGNOSIS — M1712 Unilateral primary osteoarthritis, left knee: Secondary | ICD-10-CM | POA: Diagnosis not present

## 2021-12-16 DIAGNOSIS — M25562 Pain in left knee: Secondary | ICD-10-CM | POA: Diagnosis not present

## 2021-12-16 DIAGNOSIS — M1711 Unilateral primary osteoarthritis, right knee: Secondary | ICD-10-CM | POA: Diagnosis not present

## 2022-01-06 DIAGNOSIS — M1712 Unilateral primary osteoarthritis, left knee: Secondary | ICD-10-CM | POA: Diagnosis not present

## 2022-01-06 DIAGNOSIS — M1711 Unilateral primary osteoarthritis, right knee: Secondary | ICD-10-CM | POA: Diagnosis not present

## 2022-03-17 ENCOUNTER — Telehealth: Payer: Self-pay

## 2022-03-17 ENCOUNTER — Other Ambulatory Visit: Payer: Self-pay

## 2022-03-17 DIAGNOSIS — I10 Essential (primary) hypertension: Secondary | ICD-10-CM

## 2022-03-17 DIAGNOSIS — F329 Major depressive disorder, single episode, unspecified: Secondary | ICD-10-CM

## 2022-03-17 DIAGNOSIS — E785 Hyperlipidemia, unspecified: Secondary | ICD-10-CM

## 2022-03-17 DIAGNOSIS — Z85850 Personal history of malignant neoplasm of thyroid: Secondary | ICD-10-CM

## 2022-03-17 MED ORDER — LEVOTHYROXINE SODIUM 88 MCG PO TABS: 88.0000 ug | ORAL_TABLET | Freq: Every day | ORAL | 1 refills | Status: DC

## 2022-03-17 MED ORDER — PROPRANOLOL HCL 40 MG PO TABS: 40.0000 mg | ORAL_TABLET | Freq: Every day | ORAL | 1 refills | Status: DC

## 2022-03-17 MED ORDER — BUPROPION HCL ER (XL) 150 MG PO TB24: 150.0000 mg | ORAL_TABLET | Freq: Every day | ORAL | 3 refills | Status: DC

## 2022-03-17 MED ORDER — CITALOPRAM HYDROBROMIDE 40 MG PO TABS: 40.0000 mg | ORAL_TABLET | Freq: Every day | ORAL | 3 refills | Status: DC

## 2022-03-17 MED ORDER — LOVASTATIN 40 MG PO TABS: 40.0000 mg | ORAL_TABLET | Freq: Every day | ORAL | 1 refills | Status: DC

## 2022-03-17 NOTE — Telephone Encounter (Signed)
Pt was called back to find out what she  needed. Pt is requesting to fill her wellbutrin and celexa. Please advise [facility]

## 2022-04-08 DIAGNOSIS — L237 Allergic contact dermatitis due to plants, except food: Secondary | ICD-10-CM | POA: Diagnosis not present

## 2022-04-22 IMAGING — CT CT ABD-PELV W/ CM
2 of 5 series · 16 of 46 positions shown, 18 images · IV contrast (Omnipaque or Isovue)
Comparison: None

CLINICAL DATA: Nausea, vomiting, epigastric and centralized
abdominal pain, symptoms for 3 days

EXAM:
CT ABDOMEN AND PELVIS WITH CONTRAST
TECHNIQUE: Multidetector CT imaging of the abdomen and pelvis was performed
using the standard protocol following bolus administration of
intravenous contrast. Sagittal and coronal MPR images reconstructed
from axial data set.
CONTRAST:  100mL OMNIPAQUE IOHEXOL 300 MG/ML SOLN IV. No oral
contrast.

[Series 3: axial st · axial · 0.87mm/px · z∈[+958,+1398]mm · 13 of 100 slices shown, 15 images]
[im 6/100  soft-tissue]
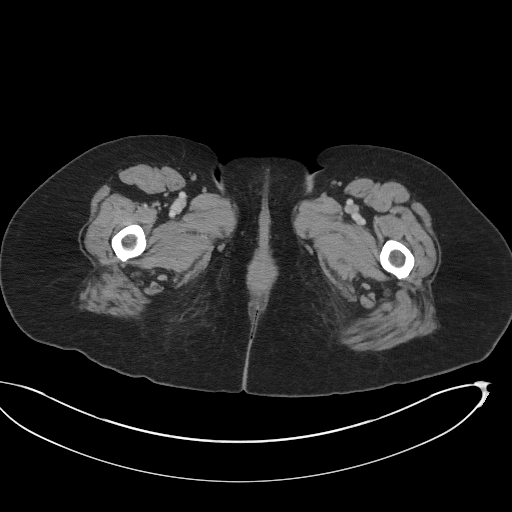
[im 6/100  bone]
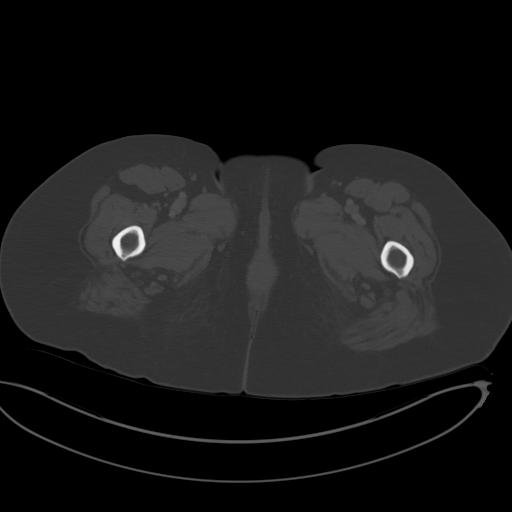
[im 16/100  soft-tissue]
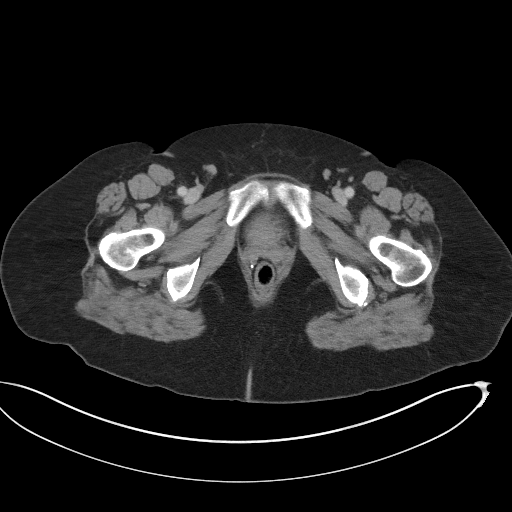
[im 21/100  soft-tissue]
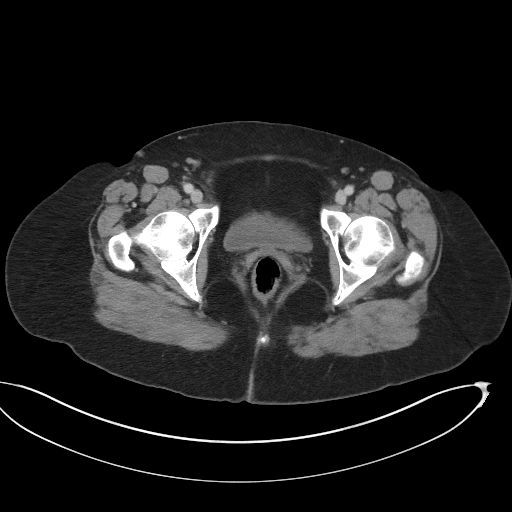
[im 27/100  soft-tissue]
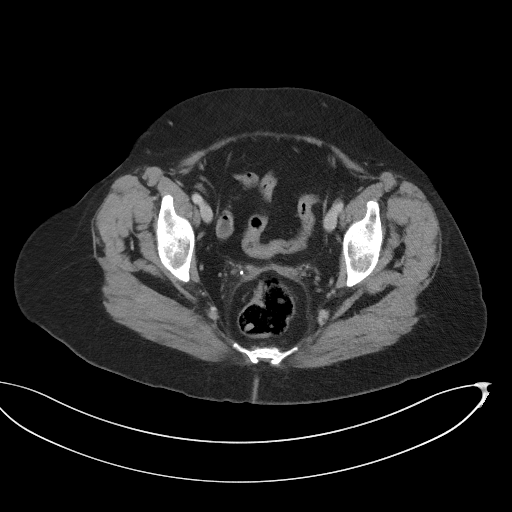
[im 37/100  soft-tissue]
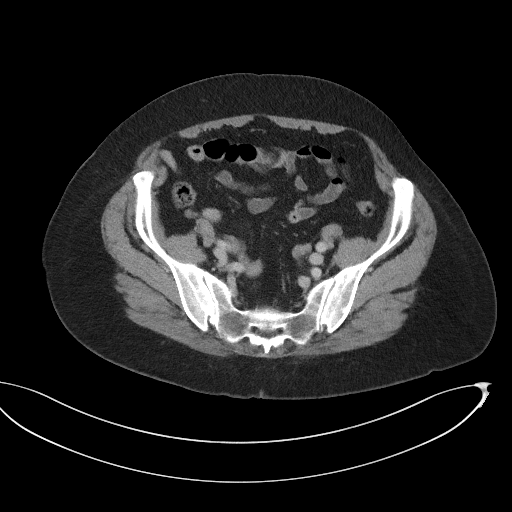
[im 42/100  soft-tissue]
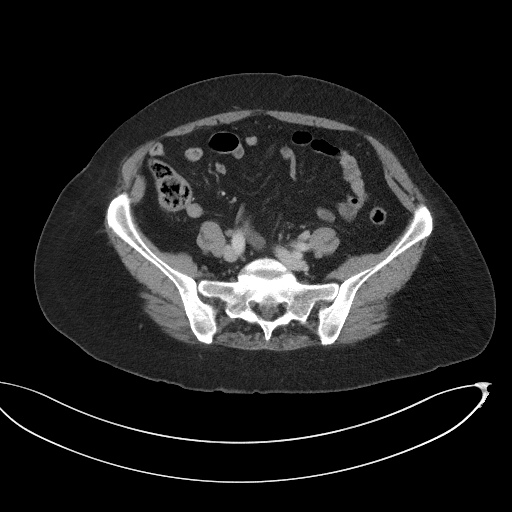
[im 53/100  soft-tissue]
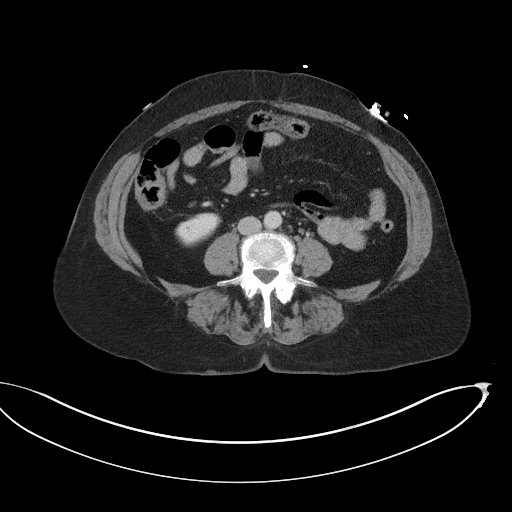
[im 58/100  soft-tissue]
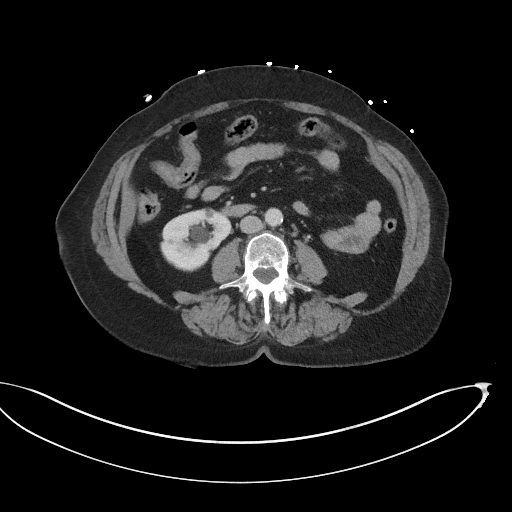
[im 63/100  soft-tissue]
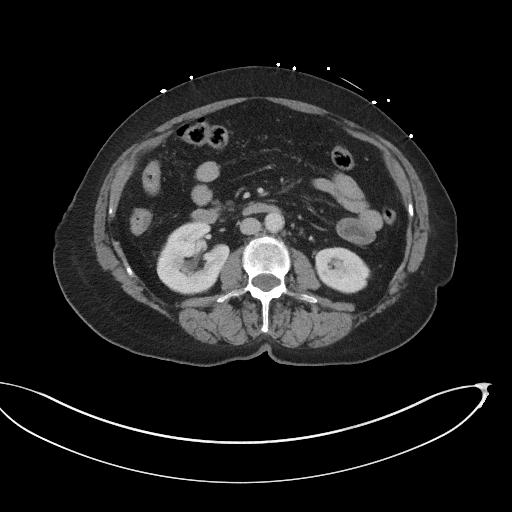
[im 63/100  bone]
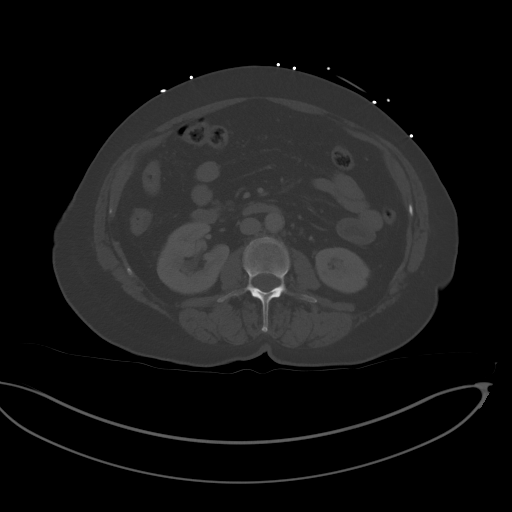
[im 73/100  soft-tissue]
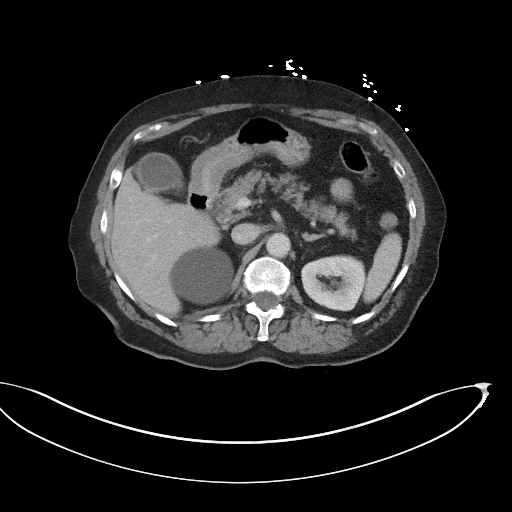
[im 79/100  soft-tissue]
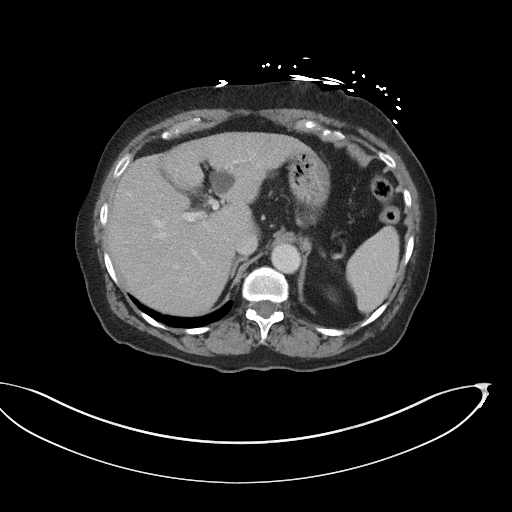
[im 84/100  soft-tissue]
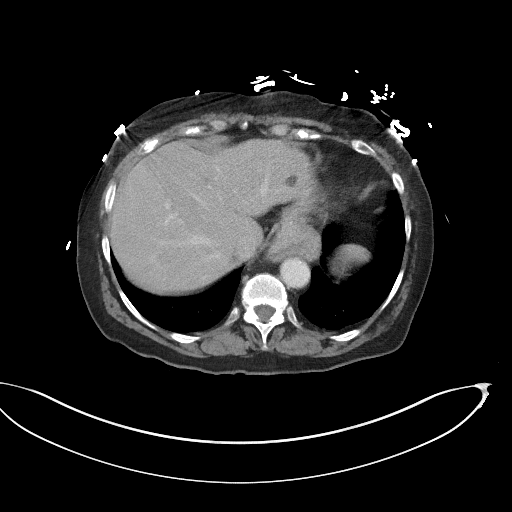
[im 94/100  soft-tissue]
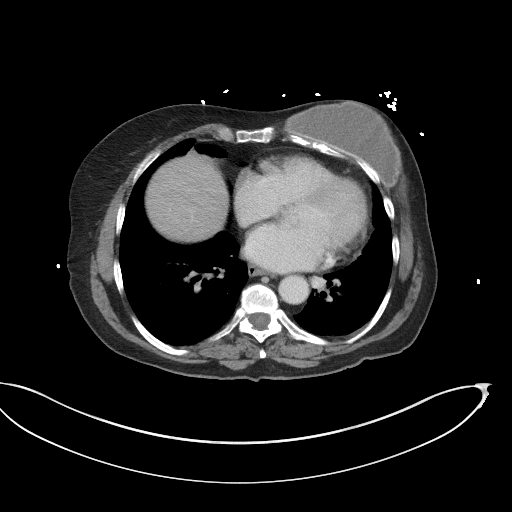

[Series 6: coronal st · coronal · 0.87mm/px · 3 of 115 slices shown]
[im 39/115  soft-tissue]
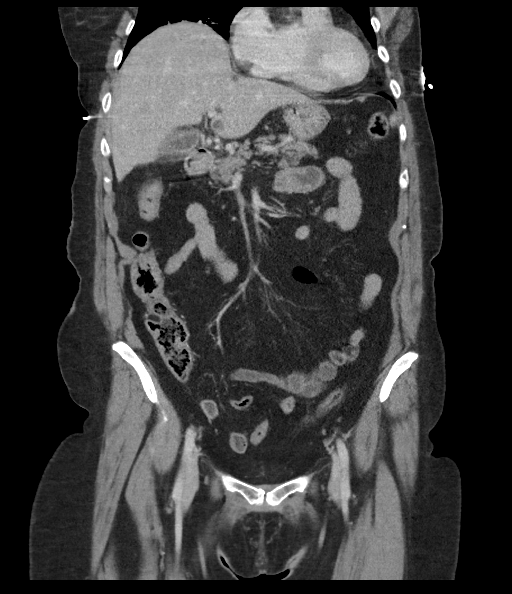
[im 51/115  soft-tissue]
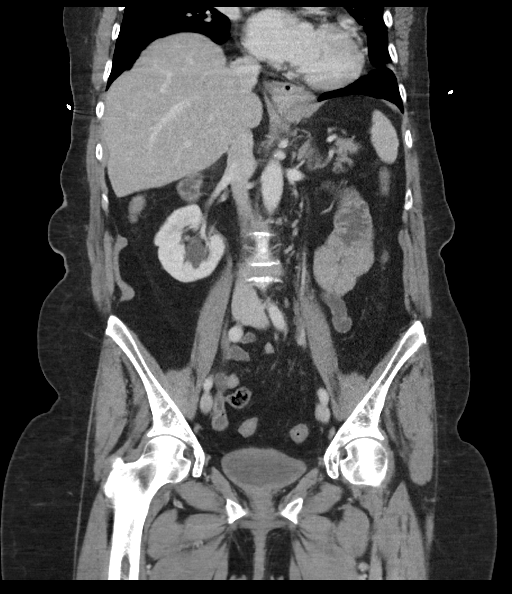
[im 64/115  soft-tissue]
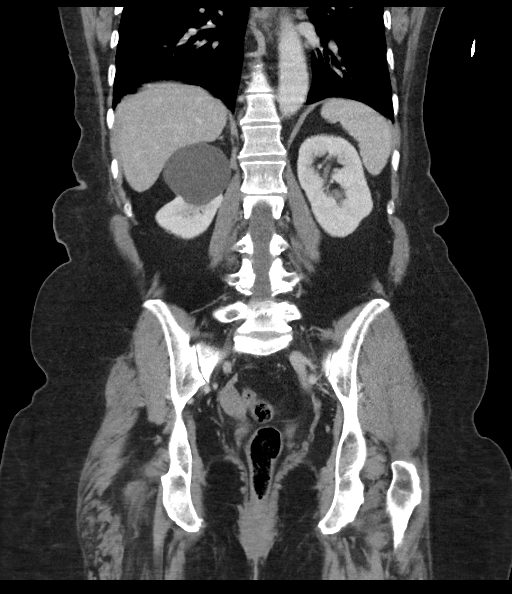

[16 of 46 positions shown; findings below may reference images not displayed]

FINDINGS: Lower chest: Minimal atelectasis at LEFT base. LEFT breast
prosthesis noted.

Hepatobiliary: Scattered low attenuation lesions within the liver
likely cysts, largest lateral segment LEFT lobe 2.2 x 2.1 cm image
22. Mild thickening of gallbladder wall with minimal adjacent edema.
Subtle increased attenuation centrally within gallbladder, cannot
exclude noncalcified stones. Findings are concerning for
cholecystitis. No biliary dilatation.

Pancreas: Normal appearance

Spleen: Normal a porous

Adrenals/Urinary Tract: Adrenal glands and RIGHT kidney normal
appearance. Large cyst at upper pole LEFT kidney 6.1 x 5.4 cm image
30. Additional tiny LEFT renal cysts. Indeterminate lesion at
inferior pole LEFT kidney 9 x 8 mm, containing slight wall
irregularity, intermediate attenuation, and minimal internal
complexity. No urinary tract calcification or dilatation. Bladder
and ureters unremarkable.

Stomach/Bowel: Normal appendix. Small hiatal hernia. Stomach and
bowel loops otherwise normal appearance

Vascular/Lymphatic: Atherosclerotic calcifications aorta without
aneurysm. No adenopathy

Reproductive: Uterus surgically absent.  Ovaries unremarkable.

Other: No free air or free fluid.  No hernia.

Musculoskeletal: Unremarkable
IMPRESSION: Mild thickening of gallbladder wall with minimal adjacent edema and
questionable noncalcified stones, findings which are concerning for
cholecystitis, recommend correlation with ultrasound.

Multiple hepatic and LEFT renal cysts.

Indeterminate 9 x 8 mm intermediate attenuation inferior pole LEFT
renal lesion, recommend characterization by non emergent MR imaging
with and without contrast to exclude small renal neoplasm.

Small hiatal hernia.

Aortic Atherosclerosis ([id number]-[id number].Y).

## 2022-04-23 IMAGING — DX DG CHEST 1V PORT
1 series · 1 of 1 positions shown · non-contrast
Comparison: Chest radiograph 10/03/2014

CLINICAL DATA: Preoperative evaluation prior to cholecystectomy.

EXAM:
PORTABLE CHEST 1 VIEW

[chest ap]
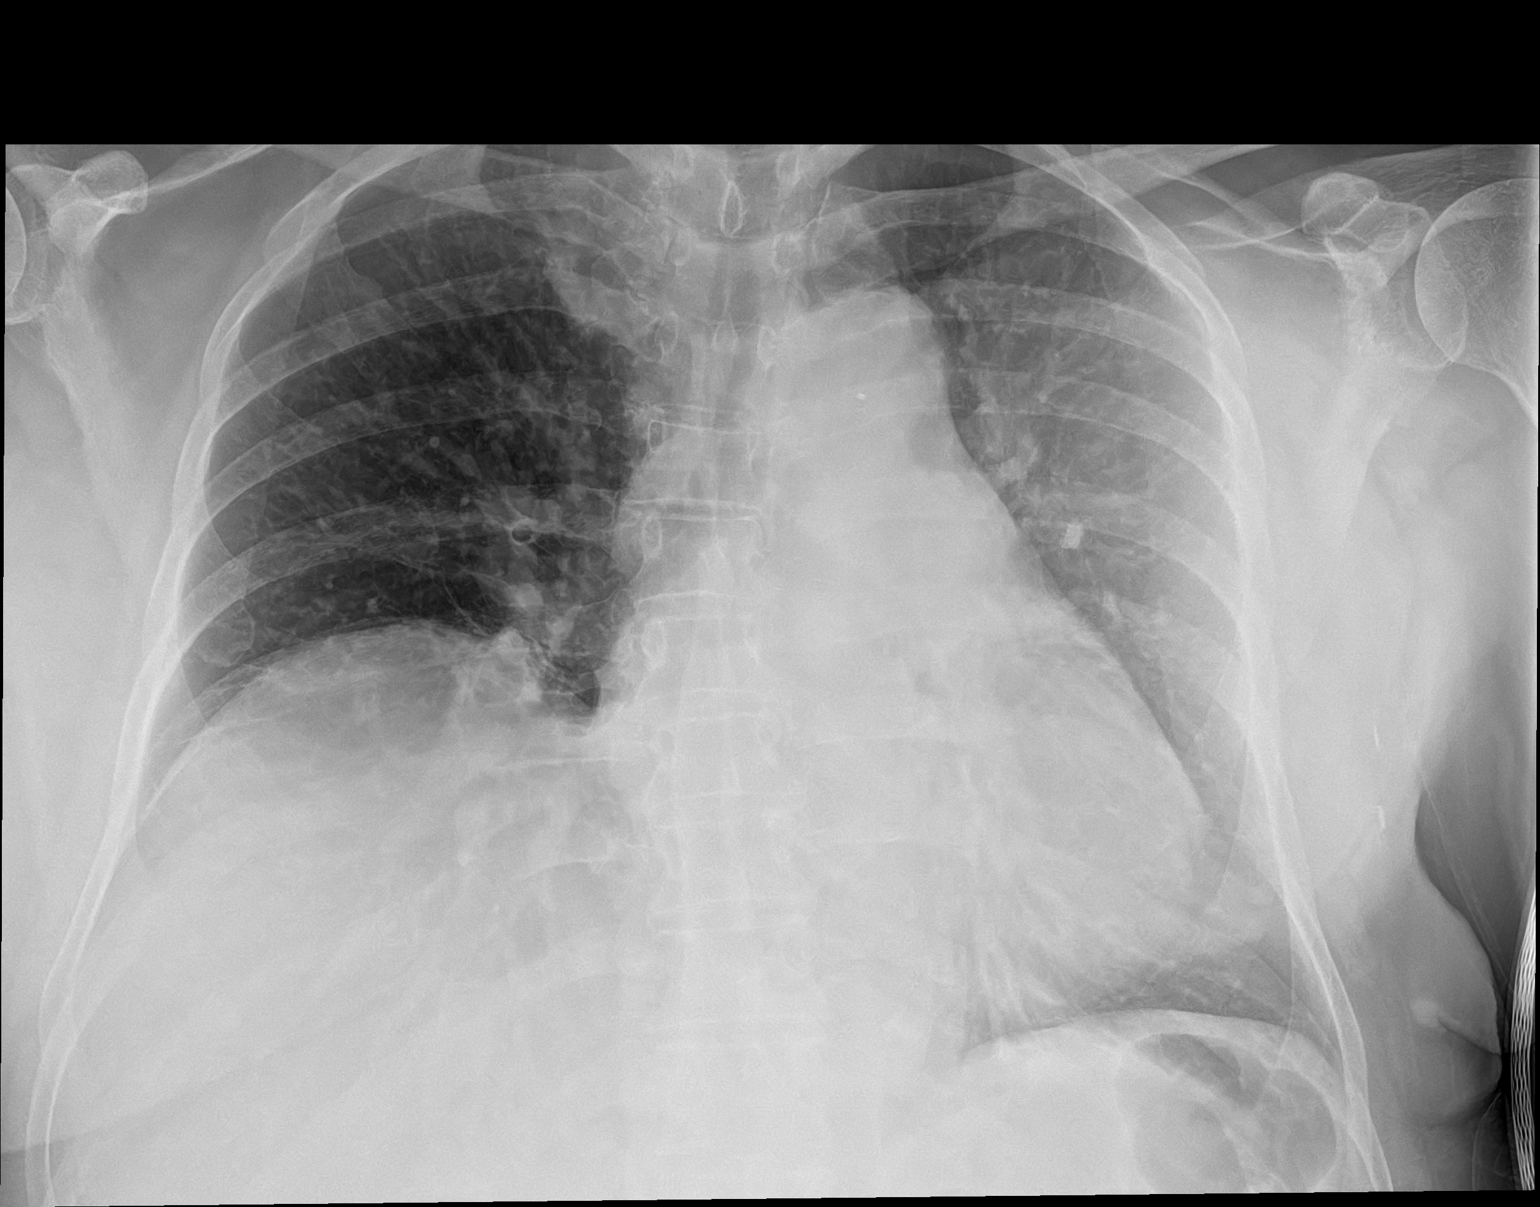

[1 of 1 positions shown; findings below may reference images not displayed]

FINDINGS: There is a new oval density within the right paratracheal location.
Stable cardiac contours. Elevation right hemidiaphragm. No large
area pulmonary consolidation. No pleural effusion or pneumothorax.
Hazy density projecting over the left hemithorax compatible with
overlying implant.
IMPRESSION: New oval density within the right paratracheal location which is
nonspecific and potentially may represent thyroid tissue. Mass not
entirely excluded. Recommend further evaluation with chest CT.

Otherwise no acute cardiopulmonary process.

These results will be called to the ordering clinician or
representative by the Radiologist Assistant, and communication
documented in the PACS or [REDACTED].

## 2022-05-25 ENCOUNTER — Other Ambulatory Visit: Payer: Self-pay | Admitting: Family Medicine

## 2022-05-25 DIAGNOSIS — Z85850 Personal history of malignant neoplasm of thyroid: Secondary | ICD-10-CM

## 2022-05-25 DIAGNOSIS — E785 Hyperlipidemia, unspecified: Secondary | ICD-10-CM

## 2022-05-25 DIAGNOSIS — I10 Essential (primary) hypertension: Secondary | ICD-10-CM

## 2022-06-15 ENCOUNTER — Encounter: Payer: Self-pay | Admitting: Family Medicine

## 2022-06-15 ENCOUNTER — Ambulatory Visit (INDEPENDENT_AMBULATORY_CARE_PROVIDER_SITE_OTHER): Payer: Medicare Other | Admitting: Family Medicine

## 2022-06-15 VITALS — BP 130/80 | HR 83 | Temp 97.9°F | Ht 62.5 in | Wt 175.0 lb

## 2022-06-15 DIAGNOSIS — Z800 Family history of malignant neoplasm of digestive organs: Secondary | ICD-10-CM

## 2022-06-15 DIAGNOSIS — F329 Major depressive disorder, single episode, unspecified: Secondary | ICD-10-CM

## 2022-06-15 DIAGNOSIS — C439 Malignant melanoma of skin, unspecified: Secondary | ICD-10-CM

## 2022-06-15 DIAGNOSIS — K219 Gastro-esophageal reflux disease without esophagitis: Secondary | ICD-10-CM

## 2022-06-15 DIAGNOSIS — Z23 Encounter for immunization: Secondary | ICD-10-CM | POA: Diagnosis not present

## 2022-06-15 DIAGNOSIS — M1990 Unspecified osteoarthritis, unspecified site: Secondary | ICD-10-CM

## 2022-06-15 DIAGNOSIS — E785 Hyperlipidemia, unspecified: Secondary | ICD-10-CM | POA: Diagnosis not present

## 2022-06-15 DIAGNOSIS — J309 Allergic rhinitis, unspecified: Secondary | ICD-10-CM

## 2022-06-15 DIAGNOSIS — Z131 Encounter for screening for diabetes mellitus: Secondary | ICD-10-CM

## 2022-06-15 DIAGNOSIS — Z85850 Personal history of malignant neoplasm of thyroid: Secondary | ICD-10-CM

## 2022-06-15 DIAGNOSIS — I739 Peripheral vascular disease, unspecified: Secondary | ICD-10-CM | POA: Diagnosis not present

## 2022-06-15 DIAGNOSIS — D0512 Intraductal carcinoma in situ of left breast: Secondary | ICD-10-CM

## 2022-06-15 DIAGNOSIS — I10 Essential (primary) hypertension: Secondary | ICD-10-CM

## 2022-06-15 DIAGNOSIS — R7309 Other abnormal glucose: Secondary | ICD-10-CM | POA: Diagnosis not present

## 2022-06-15 DIAGNOSIS — Z0000 Encounter for general adult medical examination without abnormal findings: Secondary | ICD-10-CM

## 2022-06-15 DIAGNOSIS — E2839 Other primary ovarian failure: Secondary | ICD-10-CM

## 2022-06-15 MED ORDER — BUPROPION HCL ER (XL) 150 MG PO TB24: 150.0000 mg | ORAL_TABLET | Freq: Every day | ORAL | 3 refills | Status: DC

## 2022-06-15 MED ORDER — LOVASTATIN 40 MG PO TABS: 40.0000 mg | ORAL_TABLET | Freq: Every day | ORAL | 3 refills | Status: DC

## 2022-06-15 MED ORDER — LEVOTHYROXINE SODIUM 88 MCG PO TABS: 88.0000 ug | ORAL_TABLET | Freq: Every day | ORAL | 0 refills | Status: DC

## 2022-06-15 MED ORDER — PROPRANOLOL HCL 40 MG PO TABS: 40.0000 mg | ORAL_TABLET | Freq: Every day | ORAL | 0 refills | Status: DC

## 2022-06-15 NOTE — Progress Notes (Unsigned)
Ariel Henderson is a 76 y.o. female who presents for annual wellness visit ,CPE and follow-up on chronic medical conditions.  She has no particular concerns or questions.  She has had difficulty with arthritis but has had knee injections which has helped a lot.  Her allergies seem to be under good control.  She has not had a headache in about 6 months.  She does have a history of melanoma but has not been followed up on in a couple of years.  Psychologically she seems to doing well on Celexa and Wellbutrin.  Continues on Mevacor without difficulty.  Also taking Synthroid for her underlying thyroid condition.  Pepcid seems to be controlling her reflux symptoms.  She does follow-up regularly with oncology concerning her breast cancer.   Immunizations and Health Maintenance Immunization History  Administered Date(s) Administered   Fluad Quad(high Dose 65+) 08/15/2019, 05/28/2020, 06/06/2021   Influenza Split 04/17/2013, 06/28/2013, 05/11/2014   Influenza, High Dose Seasonal PF 05/03/2017, 05/31/2018   Influenza-Unspecified 07/23/2010, 05/22/2011, 05/13/2012   PFIZER(Purple Top)SARS-COV-2 Vaccination 05/28/2020, 06/20/2020   Pneumococcal Conjugate-13 09/28/2013, 01/19/2015   Pneumococcal Polysaccharide-23 10/23/2010   Td 02/13/2002, 04/27/2012   Zoster Recombinat (Shingrix) 12/17/2016, 02/26/2017   Zoster, Live 10/28/2011   Health Maintenance Due  Topic Date Due   DEXA SCAN  Never done    Last Pap smear: age out  Last mammogram: 04/08/21 Last colonoscopy: N/A Last DEXA: 02/27/2010 Dentist: Q year Ophtho: over two years Exercise: walking QD  Other doctors caring for patient include: Dr. Ennis Forts ortho            Dr. Lindi Adie oncology Advanced directives: Does Patient Have a Medical Advance Directive?: Yes Type of Advance Directive: Living will, Healthcare Power of Attorney Does patient want to make changes to medical advance directive?: No - Patient declined Copy of [place]  in Chart?: No - copy requested  Depression screen:  See questionnaire below.     06/15/2022   10:45 AM 06/06/2021   11:05 AM 05/28/2020    2:15 PM 01/05/2019    2:53 PM 09/19/2018   11:05 AM  Depression screen PHQ 2/9  Decreased Interest 0 0 0 0 0  Down, Depressed, Hopeless '1 1 1 1 1  '$ PHQ - 2 Score '1 1 1 1 1  '$ Altered sleeping 3    3  Tired, decreased energy 0    0  Change in appetite 1    0  Feeling bad or failure about yourself  0    0  Trouble concentrating 0    1  Moving slowly or fidgety/restless 0    0  Suicidal thoughts 0    0  PHQ-9 Score 5    5  Difficult doing work/chores Not difficult at all    Somewhat difficult    Fall Risk Screen: see questionnaire below.    06/15/2022   10:45 AM 06/06/2021   11:03 AM 05/28/2020    2:15 PM 01/05/2019    2:53 PM 06/23/2017   10:31 AM  Fall Risk   Falls in the past year? 0 0 0 0 No  Number falls in past yr: 0 0     Injury with Fall? 0 0     Risk for fall due to : History of fall(s) No Fall Risks     Follow up Falls evaluation completed        ADL screen:  See questionnaire below Functional Status Survey: Is the patient deaf or have difficulty  hearing?: No Does the patient have difficulty seeing, even when wearing glasses/contacts?: No Does the patient have difficulty concentrating, remembering, or making decisions?: No Does the patient have difficulty walking or climbing stairs?: No Does the patient have difficulty dressing or bathing?: No Does the patient have difficulty doing errands alone such as visiting a doctor's office or shopping?: No   Review of Systems Constitutional: -, -unexpected weight change, -anorexia, -fatigue Allergy: -sneezing, -itching, -congestion Dermatology: denies changing moles, rash, lumps ENT: -runny nose, -ear pain, -sore throat,  Cardiology:  -chest pain, -palpitations, -orthopnea, Respiratory: -cough, -shortness of breath, -dyspnea on exertion, -wheezing,  Gastroenterology: -abdominal pain,  -nausea, -vomiting, -diarrhea, -constipation, -dysphagia Hematology: -bleeding or bruising problems Musculoskeletal: -arthralgias, -myalgias, -joint swelling, -back pain, - Ophthalmology: -vision changes,  Urology: -dysuria, -difficulty urinating,  -urinary frequency, -urgency, incontinence Neurology: -, -numbness, , -memory loss, -falls, -dizziness    PHYSICAL EXAM:  BP 130/80   Pulse 83   Temp 97.9 F (36.6 C)   Ht 5' 2.5" (1.588 m)   Wt 175 lb (79.4 kg)   SpO2 96%   BMI 31.50 kg/m   General Appearance: Alert, cooperative, no distress, appears stated age Head: Normocephalic, without obvious abnormality, atraumatic Eyes: PERRL, conjunctiva/corneas clear, EOM's intact,  Ears: Normal TM's and external ear canals Nose: Nares normal, mucosa normal, no drainage or sinus tenderness Throat: Lips, mucosa, and tongue normal; teeth and gums normal Neck: Supple, no lymphadenopathy;  thyroid:  no enlargement/tenderness/nodules; no carotid bruit or JVD Lungs: Clear to auscultation bilaterally without wheezes, rales or ronchi; respirations unlabored Heart: Regular rate and rhythm, S1 and S2 normal, no murmur, rubor gallop Abdomen: Soft, non-tender, nondistended, normoactive bowel sounds,  no masses, no hepatosplenomegaly  Skin:  Skin color, texture, turgor normal, no rashes or lesions Lymph nodes: Cervical, supraclavicular, and axillary nodes normal Neurologic:  CNII-XII intact, normal strength, sensation and gait; reflexes 2+ and symmetric throughout Psych: Normal mood, affect, hygiene and grooming.  ASSESSMENT/PLAN: Routine general medical examination at a health care facility - Plan: CBC with Differential/Platelet, Comprehensive metabolic panel, Lipid panel  Reactive depression - Plan: buPROPion (WELLBUTRIN XL) 150 MG 24 hr tablet  PAD (peripheral artery disease) ([place]) - Plan: CBC with Differential/Platelet, Comprehensive metabolic panel  Melanoma of skin (HCC) - Plan: CBC with  Differential/Platelet, Comprehensive metabolic panel, Ambulatory referral to Dermatology  History of thyroid cancer - Plan: CBC with Differential/Platelet, Comprehensive metabolic panel, TSH, levothyroxine (SYNTHROID) 88 MCG tablet  Ductal carcinoma in situ (DCIS) of left breast  Allergic rhinitis, unspecified seasonality, unspecified trigger  Arthritis  Primary hypertension - Plan: propranolol (INDERAL) 40 MG tablet  Need for influenza vaccination - Plan: Flu Vaccine QUAD High Dose(Fluad)  Estrogen deficiency - Plan: DG Bone Density  Hyperlipidemia, unspecified hyperlipidemia type - Plan: Lipid panel, lovastatin (MEVACOR) 40 MG tablet  Gastroesophageal reflux disease without esophagitis  Screening for diabetes mellitus - Plan: POCT glycosylated hemoglobin (Hb A1C)    at least 30 minutes of aerobic activity at least 5 days/week and weight-bearing exercise 2x/week;  healthy diet, including goals of calcium and vitamin D intake .  Immunization recommendations discussed.  Colonoscopy recommendations reviewed   Medicare Attestation I have personally reviewed: The patient's medical and social history Their use of alcohol, tobacco or illicit drugs Their current medications and supplements The patient's functional ability including ADLs,fall risks, home safety risks, cognitive, and hearing and visual impairment Diet and physical activities Evidence for depression or mood disorders  The patient's weight, height, and BMI have been recorded  in the chart.  I have made referrals, counseling, and provided education to the patient based on review of the above and I have provided the patient with a written personalized care plan for preventive services.     Jill Alexanders, MD   06/15/2022

## 2022-06-16 LAB — POCT GLYCOSYLATED HEMOGLOBIN (HGB A1C): Hemoglobin A1C: 5.8 % — AB (ref 4.0–5.6)

## 2022-06-17 LAB — COMPREHENSIVE METABOLIC PANEL
ALT: 19 IU/L (ref 0–32)
AST: 24 IU/L (ref 0–40)
Albumin/Globulin Ratio: 1.8 (ref 1.2–2.2)
Albumin: 4.3 g/dL (ref 3.8–4.8)
Alkaline Phosphatase: 68 IU/L (ref 44–121)
BUN/Creatinine Ratio: 23 (ref 12–28)
BUN: 16 mg/dL (ref 8–27)
Bilirubin Total: <0.2 mg/dL (ref 0.0–1.2)
CO2: 21 mmol/L (ref 20–29)
Calcium: 9.2 mg/dL (ref 8.7–10.3)
Chloride: 106 mmol/L (ref 96–106)
Creatinine, Ser: 0.7 mg/dL (ref 0.57–1.00)
Globulin, Total: 2.4 g/dL (ref 1.5–4.5)
Glucose: 103 mg/dL — ABNORMAL HIGH (ref 70–99)
Potassium: 4.7 mmol/L (ref 3.5–5.2)
Sodium: 142 mmol/L (ref 134–144)
Total Protein: 6.7 g/dL (ref 6.0–8.5)
eGFR: 89 mL/min/{1.73_m2} (ref >59)

## 2022-06-17 LAB — CBC WITH DIFFERENTIAL/PLATELET
Basophils Absolute: 0.1 10*3/uL (ref 0.0–0.2)
Basos: 1 %
EOS (ABSOLUTE): 0.2 10*3/uL (ref 0.0–0.4)
Eos: 2 %
Hematocrit: 39.3 % (ref 34.0–46.6)
Hemoglobin: 12.8 g/dL (ref 11.1–15.9)
Immature Grans (Abs): 0.1 10*3/uL (ref 0.0–0.1)
Immature Granulocytes: 1 %
Lymphocytes Absolute: 2.6 10*3/uL (ref 0.7–3.1)
Lymphs: 35 %
MCH: 29 pg (ref 26.6–33.0)
MCHC: 32.6 g/dL (ref 31.5–35.7)
MCV: 89 fL (ref 79–97)
Monocytes Absolute: 0.6 10*3/uL (ref 0.1–0.9)
Monocytes: 8 %
Neutrophils Absolute: 3.9 10*3/uL (ref 1.4–7.0)
Neutrophils: 53 %
Platelets: 286 10*3/uL (ref 150–450)
RBC: 4.41 x10E6/uL (ref 3.77–5.28)
RDW: 13.5 % (ref 11.7–15.4)
WBC: 7.4 10*3/uL (ref 3.4–10.8)

## 2022-06-17 LAB — LIPID PANEL
Chol/HDL Ratio: 4.6 ratio — ABNORMAL HIGH (ref 0.0–4.4)
Cholesterol, Total: 235 mg/dL — ABNORMAL HIGH (ref 100–199)
HDL: 51 mg/dL (ref >39)
LDL Chol Calc (NIH): 168 mg/dL — ABNORMAL HIGH (ref 0–99)
Triglycerides: 92 mg/dL (ref 0–149)
VLDL Cholesterol Cal: 16 mg/dL (ref 5–40)

## 2022-06-17 LAB — TSH: TSH: 19.4 u[IU]/mL — ABNORMAL HIGH (ref 0.450–4.500)

## 2022-06-17 MED ORDER — LEVOTHYROXINE SODIUM 100 MCG PO TABS: 100.0000 ug | ORAL_TABLET | Freq: Every day | ORAL | 0 refills | Status: DC

## 2022-06-17 NOTE — Addendum Note (Signed)
Addended by: Denita Lung on: 06/17/2022 01:17 PM   Modules accepted: Orders

## 2022-08-17 ENCOUNTER — Other Ambulatory Visit: Payer: Medicare Other

## 2022-08-17 DIAGNOSIS — Z85850 Personal history of malignant neoplasm of thyroid: Secondary | ICD-10-CM

## 2022-08-18 ENCOUNTER — Other Ambulatory Visit: Payer: Self-pay | Admitting: Family Medicine

## 2022-08-18 DIAGNOSIS — Z85850 Personal history of malignant neoplasm of thyroid: Secondary | ICD-10-CM

## 2022-08-18 LAB — TSH: TSH: 9.13 u[IU]/mL — ABNORMAL HIGH (ref 0.450–4.500)

## 2022-08-18 MED ORDER — LEVOTHYROXINE SODIUM 112 MCG PO TABS: 112.0000 ug | ORAL_TABLET | Freq: Every day | ORAL | 3 refills | Status: DC

## 2022-09-22 ENCOUNTER — Inpatient Hospital Stay: Payer: Medicare Other | Attending: Hematology and Oncology | Admitting: Hematology and Oncology

## 2022-09-22 NOTE — Assessment & Plan Note (Deleted)
DCIS left breast diagnosed in 2014 status post mastectomy after the initial lumpectomy had positive margins. She underwent breast reconstruction. Tamoxifen July 2014-July 2019   Breast Cancer Surveillance: 1. Breast exam 09/22/2022: Normal , tenderness in the left axilla. No palpable nodules or lumps. 2. Rt Mammogram 04/11/2021: Benign, Postsurgical changes. Breast Density Category B. New mammogram needs to be obtained.  RTC in 1 year for follow-up

## 2022-09-22 NOTE — Progress Notes (Incomplete)
Patient Care Team: Denita Lung, MD as PCP - General (Family Medicine)  DIAGNOSIS:  Encounter Diagnosis  Name Primary?   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  The Comprehensive Common Cancer Panel offered by [facility] includes sequencing and/or deletion duplication testing of the following 29 genes: APC, ATM, AXIN2, BARD1, BMPR1A, BRCA1, BRCA2, BRIP1, CDH1, CDK4, CDKN2A, CHEK2, EPCAM, FANCC, MLH1, MSH2, MSH6, MUTYH, NBN, PALB2, PMS2, PTEN, RAD51C, RAD51D, SMAD4, STK11, TP53, and VHL.   The report date is February 22, 2013.  UPDATE: VHL c.5C>G (p.Pro2Leu) has been reclassified from a variant of uncertain significance to a likely benign variant based on a combination of sources, e.g., internal data, published literature, population databases and in silico models.  The updated report date is March 30, 2017.    03/27/2013 -  Anti-estrogen oral therapy   Tamoxifen 20 mg daily     CHIEF COMPLIANT: Follow-up of left breast DCIS    INTERVAL HISTORY: Ariel Henderson is a 77 y.o. with above-mentioned history of  left breast DCIS treated with mastectomy. She presents to the clinic for a follow-up.    ALLERGIES:  is allergic to aspirin.  MEDICATIONS:  Current Outpatient Medications  Medication Sig Dispense Refill   benzonatate (TESSALON) 100 MG capsule Take 1 capsule (100 mg total) by mouth every 8 (eight) hours. (Patient not taking: Reported on 06/06/2021) 21 capsule 0   buPROPion (WELLBUTRIN  XL) 150 MG 24 hr tablet Take 1 tablet (150 mg total) by mouth daily. 90 tablet 3   Calcium Carbonate-Vit D-Min (CALCIUM 1200 PO) Take 1,000 mg by mouth daily.      citalopram (CELEXA) 40 MG tablet Take 1 tablet (40 mg total) by mouth daily. 90 tablet 3   famotidine (PEPCID) 20 MG tablet Take 20 mg by mouth daily.     fluticasone (FLONASE) 50 MCG/ACT nasal spray Place 2 sprays into both nostrils daily. 1 g 0   levothyroxine (SYNTHROID) 112 MCG tablet Take 1 tablet (112 mcg total) by mouth daily. 90 tablet 3   lovastatin (MEVACOR) 40 MG tablet Take 1 tablet (40 mg total) by mouth at bedtime. 90 tablet 3   nitroGLYCERIN (NITROSTAT) 0.4 MG SL tablet Place 1 tablet (0.4 mg total) under the tongue every 5 (five) minutes as needed for chest pain. (Patient not taking: Reported on 06/15/2022) 25 tablet 6   propranolol (INDERAL) 40 MG tablet Take 1 tablet (40 mg total) by mouth daily. 90 tablet 0   SUMAtriptan (IMITREX) 100 MG tablet Take 1 tablet (100 mg total) by mouth every 2 (two) hours as needed for migraine. May repeat in 2 hours if headache persists or recurs. (Patient not taking: Reported on 06/15/2022) 10 tablet 5   No current facility-administered medications for this visit.    PHYSICAL EXAMINATION: ECOG PERFORMANCE STATUS: {CHL ONC ECOG PS:[phone]}  There were no vitals filed for this visit. There were no vitals filed for this visit.  BREAST:*** No palpable masses or nodules in  either right or left breasts. No palpable axillary supraclavicular or infraclavicular adenopathy no breast tenderness or nipple discharge. (exam performed in the presence of a chaperone)  LABORATORY DATA:  I have reviewed the data as listed    Latest Ref Rng & Units 06/15/2022   12:21 PM 06/06/2021   12:02 PM 10/01/2020    5:07 AM  CMP  Glucose 70 - 99 mg/dL 103  105  120   BUN 8 - 27 mg/dL '16  10  8   '$ Creatinine 0.57 - 1.00 mg/dL 0.70  0.69  0.75   Sodium 134 - 144 mmol/L 142  145  139   Potassium 3.5 - 5.2  mmol/L 4.7  5.0  3.5   Chloride 96 - 106 mmol/L 106  107  104   CO2 20 - 29 mmol/L '21  24  24   '$ Calcium 8.7 - 10.3 mg/dL 9.2  9.3  8.2   Total Protein 6.0 - 8.5 g/dL 6.7  6.9  5.9   Total Bilirubin 0.0 - 1.2 mg/dL <0.2  0.3  0.7   Alkaline Phos 44 - 121 IU/L 68  61  122   AST 0 - 40 IU/L 24  20  180   ALT 0 - 32 IU/L 19  12  226     Lab Results  Component Value Date   WBC 7.4 06/15/2022   HGB 12.8 06/15/2022   HCT 39.3 06/15/2022   MCV 89 06/15/2022   PLT 286 06/15/2022   NEUTROABS 3.9 06/15/2022    ASSESSMENT & PLAN:  No problem-specific Assessment & Plan notes found for this encounter.    No orders of the defined types were placed in this encounter.  The patient has a good understanding of the overall plan. she agrees with it. she will call with any problems that may develop before the next visit here. Total time spent: 30 mins including face to face time and time spent for planning, charting and co-ordination of care   Suzzette Righter, [place] 09/22/22    I Gardiner Coins am acting as a Education administrator for [employer]  ***

## 2022-09-28 ENCOUNTER — Other Ambulatory Visit: Payer: Self-pay | Admitting: Family Medicine

## 2022-09-28 DIAGNOSIS — I10 Essential (primary) hypertension: Secondary | ICD-10-CM

## 2022-10-19 ENCOUNTER — Other Ambulatory Visit: Payer: Medicare Other

## 2022-10-19 ENCOUNTER — Telehealth: Payer: Self-pay | Admitting: Family Medicine

## 2022-10-19 DIAGNOSIS — Z85850 Personal history of malignant neoplasm of thyroid: Secondary | ICD-10-CM

## 2022-10-19 NOTE — Telephone Encounter (Signed)
PT is requesting refill for wellbutrin Synthroid Propranolol Lovastatin Please send to the [facility] ([place], [place]

## 2022-10-19 NOTE — Telephone Encounter (Signed)
Tried to call patient to let her know that she can call [facility] for refills on all of these. Dr. Redmond School sent Wellbutrin 06/15/22 for 1 year Synthroid 08/2022 for 1 year Propanolol 09/28/22 for 6 months Lovastatin 06/15/2022 for 1 year. Her voicemail was not set up, will try again later.

## 2022-10-20 LAB — TSH: TSH: 1.36 u[IU]/mL (ref 0.450–4.500)

## 2022-10-20 NOTE — Telephone Encounter (Signed)
Called patient and let her know that there were refills on these medications and she just needs to call [facility] and they will refill.

## 2022-10-20 NOTE — Telephone Encounter (Signed)
Pt called back & I informed

## 2022-12-28 ENCOUNTER — Other Ambulatory Visit: Payer: Self-pay | Admitting: Family Medicine

## 2022-12-28 DIAGNOSIS — I10 Essential (primary) hypertension: Secondary | ICD-10-CM

## 2023-04-27 ENCOUNTER — Other Ambulatory Visit: Payer: Self-pay | Admitting: Family Medicine

## 2023-04-27 DIAGNOSIS — F329 Major depressive disorder, single episode, unspecified: Secondary | ICD-10-CM

## 2023-04-27 NOTE — Telephone Encounter (Signed)
Forwarding to provider for review and approval.   Last refilled:  03/17/2022 #90 with 3 refills

## 2023-05-04 ENCOUNTER — Other Ambulatory Visit: Payer: Self-pay | Admitting: Family Medicine

## 2023-05-04 DIAGNOSIS — E785 Hyperlipidemia, unspecified: Secondary | ICD-10-CM

## 2023-06-24 ENCOUNTER — Ambulatory Visit: Payer: Medicare Other | Admitting: Family Medicine

## 2023-06-24 DIAGNOSIS — E890 Postprocedural hypothyroidism: Secondary | ICD-10-CM | POA: Insufficient documentation

## 2023-07-01 ENCOUNTER — Other Ambulatory Visit: Payer: Self-pay | Admitting: Family Medicine

## 2023-07-01 DIAGNOSIS — E785 Hyperlipidemia, unspecified: Secondary | ICD-10-CM

## 2023-07-01 DIAGNOSIS — F329 Major depressive disorder, single episode, unspecified: Secondary | ICD-10-CM

## 2023-07-01 NOTE — Telephone Encounter (Signed)
Pt did not know she missed and appt as nothing was written down on her calendar. Pt is scheduled in December

## 2023-07-12 ENCOUNTER — Telehealth: Payer: Self-pay | Admitting: Family Medicine

## 2023-07-12 NOTE — Telephone Encounter (Signed)
[facility] called regarding Levothyroxine manufacture change & they need approval please call t# [phone] ref# [id number]

## 2023-07-13 NOTE — Telephone Encounter (Signed)
Received fax.

## 2023-07-13 NOTE — Telephone Encounter (Signed)
This was denied at [facility] by [facility]

## 2023-07-13 NOTE — Telephone Encounter (Signed)
Called pt, no answer, unable to leave v/m

## 2023-07-20 DIAGNOSIS — M170 Bilateral primary osteoarthritis of knee: Secondary | ICD-10-CM | POA: Diagnosis not present

## 2023-08-14 DIAGNOSIS — J988 Other specified respiratory disorders: Secondary | ICD-10-CM | POA: Diagnosis not present

## 2023-08-14 DIAGNOSIS — B9689 Other specified bacterial agents as the cause of diseases classified elsewhere: Secondary | ICD-10-CM | POA: Diagnosis not present

## 2023-08-14 DIAGNOSIS — R0789 Other chest pain: Secondary | ICD-10-CM | POA: Diagnosis not present

## 2023-08-17 ENCOUNTER — Other Ambulatory Visit: Payer: Self-pay | Admitting: Family Medicine

## 2023-08-17 DIAGNOSIS — I10 Essential (primary) hypertension: Secondary | ICD-10-CM

## 2023-08-19 ENCOUNTER — Ambulatory Visit: Payer: Medicare Other | Admitting: Family Medicine

## 2023-08-19 ENCOUNTER — Encounter: Payer: Self-pay | Admitting: Family Medicine

## 2023-08-19 VITALS — BP 132/78 | HR 56 | Ht 62.0 in | Wt 176.0 lb

## 2023-08-19 DIAGNOSIS — F329 Major depressive disorder, single episode, unspecified: Secondary | ICD-10-CM

## 2023-08-19 DIAGNOSIS — E782 Mixed hyperlipidemia: Secondary | ICD-10-CM | POA: Diagnosis not present

## 2023-08-19 DIAGNOSIS — Z23 Encounter for immunization: Secondary | ICD-10-CM

## 2023-08-19 DIAGNOSIS — Z853 Personal history of malignant neoplasm of breast: Secondary | ICD-10-CM

## 2023-08-19 DIAGNOSIS — E785 Hyperlipidemia, unspecified: Secondary | ICD-10-CM | POA: Diagnosis not present

## 2023-08-19 DIAGNOSIS — I10 Essential (primary) hypertension: Secondary | ICD-10-CM

## 2023-08-19 DIAGNOSIS — E890 Postprocedural hypothyroidism: Secondary | ICD-10-CM | POA: Diagnosis not present

## 2023-08-19 DIAGNOSIS — J301 Allergic rhinitis due to pollen: Secondary | ICD-10-CM | POA: Diagnosis not present

## 2023-08-19 DIAGNOSIS — K219 Gastro-esophageal reflux disease without esophagitis: Secondary | ICD-10-CM | POA: Diagnosis not present

## 2023-08-19 DIAGNOSIS — D0512 Intraductal carcinoma in situ of left breast: Secondary | ICD-10-CM

## 2023-08-19 DIAGNOSIS — D72829 Elevated white blood cell count, unspecified: Secondary | ICD-10-CM

## 2023-08-19 DIAGNOSIS — Z85850 Personal history of malignant neoplasm of thyroid: Secondary | ICD-10-CM | POA: Diagnosis not present

## 2023-08-19 DIAGNOSIS — M1990 Unspecified osteoarthritis, unspecified site: Secondary | ICD-10-CM

## 2023-08-19 DIAGNOSIS — C439 Malignant melanoma of skin, unspecified: Secondary | ICD-10-CM

## 2023-08-19 MED ORDER — LOVASTATIN 40 MG PO TABS: 40.0000 mg | ORAL_TABLET | Freq: Every day | ORAL | 3 refills | Status: DC

## 2023-08-19 MED ORDER — BUPROPION HCL ER (XL) 150 MG PO TB24: 150.0000 mg | ORAL_TABLET | Freq: Every day | ORAL | 0 refills | Status: DC

## 2023-08-19 MED ORDER — BUPROPION HCL ER (XL) 150 MG PO TB24
150.0000 mg | ORAL_TABLET | Freq: Every day | ORAL | 3 refills | Status: AC
Start: 2023-08-19 — End: ?

## 2023-08-19 MED ORDER — LEVOTHYROXINE SODIUM 112 MCG PO TABS: 112.0000 ug | ORAL_TABLET | Freq: Every day | ORAL | 3 refills | Status: DC

## 2023-08-19 MED ORDER — PROPRANOLOL HCL 40 MG PO TABS
40.0000 mg | ORAL_TABLET | Freq: Every day | ORAL | 3 refills | Status: AC
Start: 2023-08-19 — End: ?

## 2023-08-19 NOTE — Progress Notes (Deleted)
Ariel Henderson is a 77 y.o. female who presents for annual wellness visit and follow-up on chronic medical conditions.  She has the following concerns:  Immunizations and Health Maintenance Immunization History  Administered Date(s) Administered   Fluad Quad(high Dose 65+) 08/15/2019, 05/28/2020, 06/06/2021, 06/15/2022   Influenza Split 04/17/2013, 06/28/2013, 05/11/2014   Influenza, High Dose Seasonal PF 05/03/2017, 05/31/2018   Influenza-Unspecified 07/23/2010, 05/22/2011, 05/13/2012   PFIZER(Purple Top)SARS-COV-2 Vaccination 05/28/2020, 06/20/2020   Pneumococcal Conjugate-13 09/28/2013, 01/19/2015   Pneumococcal Polysaccharide-23 10/23/2010   Td 02/13/2002, 04/27/2012   Zoster Recombinant(Shingrix) 12/17/2016, 02/26/2017   Zoster, Live 10/28/2011   Health Maintenance Due  Topic Date Due   DEXA SCAN  Never done   COVID-19 Vaccine (3 - Pfizer risk series) 07/18/2020   DTaP/Tdap/Td (3 - Tdap) 04/27/2022   INFLUENZA VACCINE  04/08/2023   Medicare Annual Wellness (AWV)  06/16/2023    Last Pap smear: Last mammogram: Last colonoscopy: Last DEXA: Dentist: Ophtho: Exercise:  Other doctors caring for patient include:  Advanced directives:    Depression screen:  See questionnaire below.     06/15/2022   10:45 AM 06/06/2021   11:05 AM 05/28/2020    2:15 PM 01/05/2019    2:53 PM 09/19/2018   11:05 AM  Depression screen PHQ 2/9  Decreased Interest 0 0 0 0 0  Down, Depressed, Hopeless 1 1 1 1 1   PHQ - 2 Score 1 1 1 1 1   Altered sleeping 3    3  Tired, decreased energy 0    0  Change in appetite 1    0  Feeling bad or failure about yourself  0    0  Trouble concentrating 0    1  Moving slowly or fidgety/restless 0    0  Suicidal thoughts 0    0  PHQ-9 Score 5    5  Difficult doing work/chores Not difficult at all    Somewhat difficult    Fall Risk Screen: see questionnaire below.    06/15/2022   10:45 AM 06/06/2021   11:03 AM 05/28/2020    2:15 PM 01/05/2019    2:53 PM  06/23/2017   10:31 AM  Fall Risk   Falls in the past year? 0 0 0 0 No  Number falls in past yr: 0 0     Injury with Fall? 0 0     Risk for fall due to : History of fall(s) No Fall Risks     Follow up Falls evaluation completed        ADL screen:  See questionnaire below Functional Status Survey:     Review of Systems Constitutional: -, -unexpected weight change, -anorexia, -fatigue Allergy: -sneezing, -itching, -congestion Dermatology: denies changing moles, rash, lumps ENT: -runny nose, -ear pain, -sore throat,  Cardiology:  -chest pain, -palpitations, -orthopnea, Respiratory: -cough, -shortness of breath, -dyspnea on exertion, -wheezing,  Gastroenterology: -abdominal pain, -nausea, -vomiting, -diarrhea, -constipation, -dysphagia Hematology: -bleeding or bruising problems Musculoskeletal: -arthralgias, -myalgias, -joint swelling, -back pain, - Ophthalmology: -vision changes,  Urology: -dysuria, -difficulty urinating,  -urinary frequency, -urgency, incontinence Neurology: -, -numbness, , -memory loss, -falls, -dizziness    PHYSICAL EXAM:  There were no vitals taken for this visit.  General Appearance: Alert, cooperative, no distress, appears stated age Head: Normocephalic, without obvious abnormality, atraumatic Eyes: PERRL, conjunctiva/corneas clear, EOM's intact, fundi benign Ears: Normal TM's and external ear canals Nose: Nares normal, mucosa normal, no drainage or sinus tenderness Throat: Lips, mucosa, and tongue normal; teeth and gums  normal Neck: Supple, no lymphadenopathy;  thyroid:  no enlargement/tenderness/nodules; no carotid bruit or JVD Lungs: Clear to auscultation bilaterally without wheezes, rales or ronchi; respirations unlabored Heart: Regular rate and rhythm, S1 and S2 normal, no murmur, rubor gallop Abdomen: Soft, non-tender, nondistended, normoactive bowel sounds,  no masses, no hepatosplenomegaly Extremities: No clubbing, cyanosis or edema Pulses: 2+  and symmetric all extremities Skin:  Skin color, texture, turgor normal, no rashes or lesions Lymph nodes: Cervical, supraclavicular, and axillary nodes normal Neurologic:  CNII-XII intact, normal strength, sensation and gait; reflexes 2+ and symmetric throughout Psych: Normal mood, affect, hygiene and grooming.  ASSESSMENT/PLAN:    Discussed monthly self breast exams and yearly mammograms; at least 30 minutes of aerobic activity at least 5 days/week and weight-bearing exercise 2x/week; proper sunscreen use reviewed; healthy diet, including goals of calcium and vitamin D intake and alcohol recommendations (less than or equal to 1 drink/day) reviewed; regular seatbelt use; changing batteries in smoke detectors.  Immunization recommendations discussed.  Colonoscopy recommendations reviewed   Medicare Attestation I have personally reviewed: The patient's medical and social history Their use of alcohol, tobacco or illicit drugs Their current medications and supplements The patient's functional ability including ADLs,fall risks, home safety risks, cognitive, and hearing and visual impairment Diet and physical activities Evidence for depression or mood disorders  The patient's weight, height, and BMI have been recorded in the chart.  I have made referrals, counseling, and provided education to the patient based on review of the above and I have provided the patient with a written personalized care plan for preventive services.     Sharlot Gowda, MD   08/19/2023

## 2023-08-19 NOTE — Progress Notes (Signed)
Ariel Henderson is a 77 y.o. female who presents for annual wellness visit and follow-up on chronic medical conditions.  She has the following concerns: Her allergies seem to be under good control.  She does occasionally use Motrin to help with her arthritic pain.  She states that Tylenol really does not work.  She has remote history of breast cancer dating back to 2014.  She is using Pepcid for her reflux and uses it as needed.  Continues on her thyroid medication and is having no difficulty with that.  She does continue on her depression medications but she thinks that it does not really work.  Psychologically though she does seem to be in a fairly good position.  She does see dermatology regularly for follow-up on her melanoma.  She continues on Mevacor for her cholesterol.  Immunizations and Health Maintenance Immunization History  Administered Date(s) Administered   Fluad Quad(high Dose 65+) 08/15/2019, 05/28/2020, 06/06/2021, 06/15/2022   Fluad Trivalent(High Dose 65+) 08/19/2023   Influenza Split 04/17/2013, 06/28/2013, 05/11/2014   Influenza, High Dose Seasonal PF 05/03/2017, 05/31/2018   Influenza-Unspecified 07/23/2010, 05/22/2011, 05/13/2012   PFIZER(Purple Top)SARS-COV-2 Vaccination 05/28/2020, 06/20/2020   Pneumococcal Conjugate-13 09/28/2013, 01/19/2015   Pneumococcal Polysaccharide-23 10/23/2010   Td 02/13/2002, 04/27/2012   Zoster Recombinant(Shingrix) 12/17/2016, 02/26/2017   Zoster, Live 10/28/2011   Health Maintenance Due  Topic Date Due   DEXA SCAN  Never done   COVID-19 Vaccine (3 - Pfizer risk series) 07/18/2020   DTaP/Tdap/Td (3 - Tdap) 04/27/2022   Medicare Annual Wellness (AWV)  06/16/2023    Last Pap smear: aged out  Last mammogram: August 2022 Last colonoscopy: n/a Last DEXA: June 2011 Dentist: Next appt Jan 2025 Ophtho: n/a Exercise: walking for 15-30 minutes, 2-3 times a week  Other doctors caring for patient include:  Dr. Autumn Messing, ortho  Dr. Pamelia Hoit,  oncology   Advanced directives: Living will and power of attorney     Depression screen:  See questionnaire below.     08/19/2023   11:31 AM 06/15/2022   10:45 AM 06/06/2021   11:05 AM 05/28/2020    2:15 PM 01/05/2019    2:53 PM  Depression screen PHQ 2/9  Decreased Interest 0 0 0 0 0  Down, Depressed, Hopeless 0 1 1 1 1   PHQ - 2 Score 0 1 1 1 1   Altered sleeping  3     Tired, decreased energy  0     Change in appetite  1     Feeling bad or failure about yourself   0     Trouble concentrating  0     Moving slowly or fidgety/restless  0     Suicidal thoughts  0     PHQ-9 Score  5     Difficult doing work/chores  Not difficult at all       Fall Risk Screen: see questionnaire below.    08/19/2023   11:31 AM 06/15/2022   10:45 AM 06/06/2021   11:03 AM 05/28/2020    2:15 PM 01/05/2019    2:53 PM  Fall Risk   Falls in the past year? 0 0 0 0 0  Number falls in past yr: 0 0 0    Injury with Fall? 0 0 0    Risk for fall due to :  History of fall(s) No Fall Risks    Follow up  Falls evaluation completed       ADL screen:  See questionnaire below Functional Status Survey: Is  the patient deaf or have difficulty hearing?: No Does the patient have difficulty seeing, even when wearing glasses/contacts?: No Does the patient have difficulty concentrating, remembering, or making decisions?: Yes (remembering) Does the patient have difficulty walking or climbing stairs?: Yes (arthiritis in knees) Does the patient have difficulty dressing or bathing?: No Does the patient have difficulty doing errands alone such as visiting a doctor's office or shopping?: No   Review of Systems Constitutional: -, -unexpected weight change, -anorexia, -fatigue Allergy: -sneezing, -itching, -congestion Dermatology: denies changing moles, rash, lumps ENT: -runny nose, -ear pain, -sore throat,  Cardiology:  -chest pain, -palpitations, -orthopnea, Respiratory: -cough, -shortness of breath, -dyspnea on  exertion, -wheezing,  Gastroenterology: -abdominal pain, -nausea, -vomiting, -diarrhea, -constipation, -dysphagia Hematology: -bleeding or bruising problems Musculoskeletal: -arthralgias, -myalgias, -joint swelling, -back pain, - Ophthalmology: -vision changes,  Urology: -dysuria, -difficulty urinating,  -urinary frequency, -urgency, incontinence Neurology: -, -numbness, , -memory loss, -falls, -dizziness    PHYSICAL EXAM:   General Appearance: Alert, cooperative, no distress, appears stated age Head: Normocephalic, without obvious abnormality, atraumatic Eyes: PERRL, conjunctiva/corneas clear, EOM's intact,  Ears: Normal TM's and external ear canals Nose: Nares normal, mucosa normal, no drainage or sinus tenderness Throat: Lips, mucosa, and tongue normal; teeth and gums normal Neck: Supple, no lymphadenopathy;  thyroid:  no enlargement/tenderness/nodules; no carotid bruit or JVD Lungs: Clear to auscultation bilaterally without wheezes, rales or ronchi; respirations unlabored Heart: Regular rate and rhythm, S1 and S2 normal, no murmur, rubor gallop Skin:  Skin color, texture, turgor normal, no rashes or lesions Lymph nodes: Cervical, supraclavicular, and axillary nodes normal Neurologic:  CNII-XII intact, normal strength, sensation and gait; reflexes 2+ and symmetric throughout Psych: Normal mood, affect, hygiene and grooming.  ASSESSMENT/PLAN: Gastroesophageal reflux disease without esophagitis - Plan: CBC with Differential/Platelet, Comprehensive metabolic panel  Non-seasonal allergic rhinitis due to pollen  Arthritis  History of breast cancer  History of thyroid cancer - Plan: levothyroxine (SYNTHROID) 112 MCG tablet  Mixed hyperlipidemia - Plan: Lipid panel  Melanoma of skin (HCC)  Postoperative hypothyroidism - Plan: TSH  Reactive depression - Plan: buPROPion (WELLBUTRIN XL) 150 MG 24 hr tablet, DISCONTINUED: buPROPion (WELLBUTRIN XL) 150 MG 24 hr  tablet  Hypertension, unspecified type  Need for influenza vaccination - Plan: Flu Vaccine Trivalent High Dose (Fluad)  Hyperlipidemia, unspecified hyperlipidemia type - Plan: lovastatin (MEVACOR) 40 MG tablet  Primary hypertension - Plan: propranolol (INDERAL) 40 MG tablet    Recheck here in 1 year  Medicare Attestation I have personally reviewed: The patient's medical and social history Their use of alcohol, tobacco or illicit drugs Their current medications and supplements The patient's functional ability including ADLs,fall risks, home safety risks, cognitive, and hearing and visual impairment Diet and physical activities Evidence for depression or mood disorders  The patient's weight, height, and BMI have been recorded in the chart.  I have made referrals, counseling, and provided education to the patient based on review of the above and I have provided the patient with a written personalized care plan for preventive services.     Sharlot Gowda, MD   08/19/2023

## 2023-08-20 LAB — COMPREHENSIVE METABOLIC PANEL
ALT: 19 [IU]/L (ref 0–32)
AST: 16 [IU]/L (ref 0–40)
Albumin: 3.8 g/dL (ref 3.8–4.8)
Alkaline Phosphatase: 87 [IU]/L (ref 44–121)
BUN/Creatinine Ratio: 24 (ref 12–28)
BUN: 17 mg/dL (ref 8–27)
Bilirubin Total: <0.2 mg/dL (ref 0.0–1.2)
CO2: 25 mmol/L (ref 20–29)
Calcium: 8.7 mg/dL (ref 8.7–10.3)
Chloride: 103 mmol/L (ref 96–106)
Creatinine, Ser: 0.71 mg/dL (ref 0.57–1.00)
Globulin, Total: 2.1 g/dL (ref 1.5–4.5)
Glucose: 107 mg/dL — ABNORMAL HIGH (ref 70–99)
Potassium: 4.3 mmol/L (ref 3.5–5.2)
Sodium: 143 mmol/L (ref 134–144)
Total Protein: 5.9 g/dL — ABNORMAL LOW (ref 6.0–8.5)
eGFR: 87 mL/min/{1.73_m2} (ref >59)

## 2023-08-20 LAB — CBC WITH DIFFERENTIAL/PLATELET
Basophils Absolute: 0.2 10*3/uL (ref 0.0–0.2)
Basos: 1 %
EOS (ABSOLUTE): 0.3 10*3/uL (ref 0.0–0.4)
Eos: 2 %
Hematocrit: 38.1 % (ref 34.0–46.6)
Hemoglobin: 12 g/dL (ref 11.1–15.9)
Immature Grans (Abs): 0.7 10*3/uL — ABNORMAL HIGH (ref 0.0–0.1)
Immature Granulocytes: 5 %
Lymphocytes Absolute: 2.9 10*3/uL (ref 0.7–3.1)
Lymphs: 23 %
MCH: 29.1 pg (ref 26.6–33.0)
MCHC: 31.5 g/dL (ref 31.5–35.7)
MCV: 93 fL (ref 79–97)
Monocytes Absolute: 0.9 10*3/uL (ref 0.1–0.9)
Monocytes: 7 %
Neutrophils Absolute: 7.7 10*3/uL — ABNORMAL HIGH (ref 1.4–7.0)
Neutrophils: 62 %
Platelets: 357 10*3/uL (ref 150–450)
RBC: 4.12 x10E6/uL (ref 3.77–5.28)
RDW: 12.5 % (ref 11.7–15.4)
WBC: 12.6 10*3/uL — ABNORMAL HIGH (ref 3.4–10.8)

## 2023-08-20 LAB — LIPID PANEL
Chol/HDL Ratio: 3 {ratio} (ref 0.0–4.4)
Cholesterol, Total: 136 mg/dL (ref 100–199)
HDL: 45 mg/dL (ref >39)
LDL Chol Calc (NIH): 74 mg/dL (ref 0–99)
Triglycerides: 87 mg/dL (ref 0–149)
VLDL Cholesterol Cal: 17 mg/dL (ref 5–40)

## 2023-08-20 LAB — TSH: TSH: 3.53 u[IU]/mL (ref 0.450–4.500)

## 2023-08-20 NOTE — Addendum Note (Signed)
Addended by: Ronnald Nian on: 08/20/2023 03:28 AM   Modules accepted: Orders

## 2023-08-27 ENCOUNTER — Other Ambulatory Visit: Payer: Medicare Other

## 2023-08-27 DIAGNOSIS — D72829 Elevated white blood cell count, unspecified: Secondary | ICD-10-CM | POA: Diagnosis not present

## 2023-08-27 LAB — CBC WITH DIFFERENTIAL/PLATELET
Basophils Absolute: 0.1 10*3/uL (ref 0.0–0.2)
Basos: 1 %
EOS (ABSOLUTE): 0.2 10*3/uL (ref 0.0–0.4)
Eos: 2 %
Hematocrit: 36.2 % (ref 34.0–46.6)
Hemoglobin: 11.5 g/dL (ref 11.1–15.9)
Immature Grans (Abs): 0.3 10*3/uL — ABNORMAL HIGH (ref 0.0–0.1)
Immature Granulocytes: 3 %
Lymphocytes Absolute: 2.3 10*3/uL (ref 0.7–3.1)
Lymphs: 18 %
MCH: 29.3 pg (ref 26.6–33.0)
MCHC: 31.8 g/dL (ref 31.5–35.7)
MCV: 92 fL (ref 79–97)
Monocytes Absolute: 0.9 10*3/uL (ref 0.1–0.9)
Monocytes: 7 %
Neutrophils Absolute: 8.9 10*3/uL — ABNORMAL HIGH (ref 1.4–7.0)
Neutrophils: 69 %
Platelets: 435 10*3/uL (ref 150–450)
RBC: 3.93 x10E6/uL (ref 3.77–5.28)
RDW: 12.8 % (ref 11.7–15.4)
WBC: 12.8 10*3/uL — ABNORMAL HIGH (ref 3.4–10.8)

## 2023-11-08 ENCOUNTER — Encounter: Payer: Self-pay | Admitting: Family Medicine

## 2023-11-08 ENCOUNTER — Ambulatory Visit (INDEPENDENT_AMBULATORY_CARE_PROVIDER_SITE_OTHER): Payer: Medicare Other | Admitting: Family Medicine

## 2023-11-08 VITALS — BP 127/71 | HR 77 | Temp 98.1°F | Ht 62.0 in | Wt 176.6 lb

## 2023-11-08 DIAGNOSIS — D0512 Intraductal carcinoma in situ of left breast: Secondary | ICD-10-CM

## 2023-11-08 DIAGNOSIS — F419 Anxiety disorder, unspecified: Secondary | ICD-10-CM

## 2023-11-08 DIAGNOSIS — E66811 Obesity, class 1: Secondary | ICD-10-CM | POA: Diagnosis not present

## 2023-11-08 DIAGNOSIS — Z85820 Personal history of malignant melanoma of skin: Secondary | ICD-10-CM

## 2023-11-08 DIAGNOSIS — Z85850 Personal history of malignant neoplasm of thyroid: Secondary | ICD-10-CM | POA: Diagnosis not present

## 2023-11-08 DIAGNOSIS — F339 Major depressive disorder, recurrent, unspecified: Secondary | ICD-10-CM

## 2023-11-08 DIAGNOSIS — E782 Mixed hyperlipidemia: Secondary | ICD-10-CM | POA: Diagnosis not present

## 2023-11-08 DIAGNOSIS — E6609 Other obesity due to excess calories: Secondary | ICD-10-CM

## 2023-11-08 DIAGNOSIS — Z6832 Body mass index (BMI) 32.0-32.9, adult: Secondary | ICD-10-CM

## 2023-11-08 DIAGNOSIS — K219 Gastro-esophageal reflux disease without esophagitis: Secondary | ICD-10-CM | POA: Diagnosis not present

## 2023-11-08 DIAGNOSIS — I10 Essential (primary) hypertension: Secondary | ICD-10-CM | POA: Diagnosis not present

## 2023-11-08 MED ORDER — HYDROXYZINE HCL 10 MG PO TABS: 10.0000 mg | ORAL_TABLET | Freq: Three times a day (TID) | ORAL | 0 refills | Status: DC | PRN

## 2023-11-08 NOTE — Progress Notes (Signed)
 New Patient Office Visit  Subjective    Patient ID: Ariel Henderson, female    DOB: 14-Jul-1946  Age: 78 y.o. MRN: [id number]  CC:  Chief Complaint  Patient presents with   Anxiety   Depression    HPI Vernel R Mcmanamon presents to establish care. Transferring care from [facility].  Hx of HTN. On propanolol. Denies symptoms.   Takes pepcid BID prn OTC for occasional heartburn.   Hx of thyroid cancer- surgically removed. Complaint with levothyroxine 113 mcg daily.   Hx of melanoma. Needs referral to dermatology. Hasn't had skin check in 3 years.   Hx of  left breast DCIS treated with mastectomy and who completed tamoxifen in 2019.   Hx of anxiety and depression since the death of her husband a few years ago. Takes celexa and wellbutrin. Isn't sure that this helps much. Would like something that is quick acting for prn use for her anxiety- "like valium, but not as strong"     11/08/2023    1:56 PM 08/19/2023   11:31 AM 06/15/2022   10:45 AM  Depression screen PHQ 2/9  Decreased Interest 2 0 0  Down, Depressed, Hopeless 1 0 1  PHQ - 2 Score 3 0 1  Altered sleeping 3  3  Tired, decreased energy 0  0  Change in appetite 0  1  Feeling bad or failure about yourself  0  0  Trouble concentrating 1  0  Moving slowly or fidgety/restless 1  0  Suicidal thoughts 0  0  PHQ-9 Score 8  5  Difficult doing work/chores Not difficult at all  Not difficult at all      11/08/2023    1:57 PM  GAD 7 : Generalized Anxiety Score  Nervous, Anxious, on Edge 3  Control/stop worrying 3  Worry too much - different things 3  Trouble relaxing 3  Restless 3  Easily annoyed or irritable 3  Afraid - awful might happen 0  Total GAD 7 Score 18  Anxiety Difficulty Somewhat difficult      Outpatient Encounter Medications as of 11/08/2023  Medication Sig   buPROPion (WELLBUTRIN XL) 150 MG 24 hr tablet Take 1 tablet (150 mg total) by mouth daily.   Calcium Carbonate-Vit D-Min (CALCIUM  1200 PO) Take 1,000 mg by mouth daily.    citalopram (CELEXA) 40 MG tablet TAKE 1 TABLET BY MOUTH DAILY   famotidine (PEPCID) 20 MG tablet Take 20 mg by mouth daily.   fluticasone (FLONASE) 50 MCG/ACT nasal spray Place 2 sprays into both nostrils daily.   levothyroxine (SYNTHROID) 112 MCG tablet Take 1 tablet (112 mcg total) by mouth daily.   lovastatin (MEVACOR) 40 MG tablet Take 1 tablet (40 mg total) by mouth at bedtime.   nitroGLYCERIN (NITROSTAT) 0.4 MG SL tablet Place 1 tablet (0.4 mg total) under the tongue every 5 (five) minutes as needed for chest pain.   propranolol (INDERAL) 40 MG tablet Take 1 tablet (40 mg total) by mouth daily.   SUMAtriptan (IMITREX) 100 MG tablet Take 1 tablet (100 mg total) by mouth every 2 (two) hours as needed for migraine. May repeat in 2 hours if headache persists or recurs.   [DISCONTINUED] albuterol (VENTOLIN HFA) 108 (90 Base) MCG/ACT inhaler Inhale 2 puffs into the lungs every 6 (six) hours as needed for wheezing or shortness of breath.   [DISCONTINUED] benzonatate (TESSALON) 100 MG capsule Take 1 capsule (100 mg total) by mouth every 8 (eight) hours. (  Patient not taking: Reported on 06/06/2021)   [DISCONTINUED] doxycycline (MONODOX) 100 MG capsule Take 100 mg by mouth 2 (two) times daily. For 7 days   [DISCONTINUED] predniSONE (DELTASONE) 5 MG tablet Take 5 mg by mouth daily with breakfast. Take 6-5-4-3-2-1 po qd   [DISCONTINUED] promethazine-dextromethorphan (PROMETHAZINE-DM) 6.25-15 MG/5ML syrup Take 5 mLs by mouth 4 (four) times daily as needed for cough.   No facility-administered encounter medications on file as of 11/08/2023.    Past Medical History:  Diagnosis Date   Breast cancer (HCC) 12/29/12 bx   left breast ductal,high grade ductal ca in situ w/asoc comedo necrosis   Cancer (HCC)    cervical per patient   Chronic anxiety    Complication of anesthesia    "slow to wake up"   Dyslipidemia    Fibrocystic breast disease    GERD  (gastroesophageal reflux disease)    History of recurrent UTIs    Hypertension    Hypothyroidism    Major depression    Melanoma (HCC)    per patient on 02/01/13; located on back; dx in 40s   Migraine headache    Multinodular goiter    Peripheral vascular disease (HCC)    varicose veins   Pneumonia    hx   Seasonal allergies    Thyroid cancer (HCC)    per patient on 02/01/2013; dx in 30s    Past Surgical History:  Procedure Laterality Date   ABDOMINAL HYSTERECTOMY     BREAST DUCTAL SYSTEM EXCISION Left 12/29/2012   Procedure: EXCISION DUCTAL SYSTEM BREAST;  Surgeon: Currie Paris, MD;  Location: [facility];  Service: General;  Laterality: Left;   BREAST LUMPECTOMY     left   x 3, right x 2   BREAST LUMPECTOMY Left 12/06/2009   lower inner=fibrocystic changes/sclerosing adenosis   BREAST RECONSTRUCTION WITH PLACEMENT OF TISSUE EXPANDER AND FLEX HD (ACELLULAR HYDRATED DERMIS) Left 02/09/2013   Procedure: LEFT BREAST RECONSTRUCTION WITH PLACEMENT OF TISSUE EXPANDER ;  Surgeon: Etter Sjogren, MD;  Location: [facility] OR;  Service: Plastics;  Laterality: Left;   CHOLECYSTECTOMY N/A 09/30/2020   Procedure: LAPAROSCOPIC CHOLECYSTECTOMY;  Surgeon: Franky Macho, MD;  Location: [facility];  Service: General;  Laterality: N/A;   EYE SURGERY Bilateral    cataract extraction with IOL   LEFT HEART CATHETERIZATION WITH CORONARY ANGIOGRAM N/A 10/10/2014   Procedure: LEFT HEART CATHETERIZATION WITH CORONARY ANGIOGRAM;  Surgeon: Peter M Swaziland, MD;  Location: [facility] CATH LAB;  Service: Cardiovascular;  Laterality: N/A;   MASTECTOMY Left 2014   MASTECTOMY W/ SENTINEL NODE BIOPSY Left 02/09/2013   Procedure: TOTAL MASTECTOMY WITH SENTINEL LYMPH NODE BIOPSY;  Surgeon: Currie Paris, MD;  Location: [facility] OR;  Service: General;  Laterality: Left;   THYROIDECTOMY  09/07/1989   TOTAL MASTECTOMY Left 02/09/2013   WITH TISSUE EXPANDER & BIOPSY   WRIST FRACTURE SURGERY      Family History  Problem Relation Age of  Onset   Lung cancer Father 85       smoker   Ovarian cancer Sister 58   Brain cancer Sister 74   Colon cancer Brother 9   Prostate cancer Brother 80   Prostate cancer Brother 69   Prostate cancer Brother 61   Prostate cancer Brother 28   Prostate cancer Brother 26   Cancer Brother 11       metastatic; unknown primary    Social History   Socioeconomic History   Marital status: Widowed    Spouse name:  Not on file   Number of children: 1   Years of education: 12   Highest education level: High school graduate  Occupational History   Not on file  Tobacco Use   Smoking status: Never   Smokeless tobacco: Never  Substance and Sexual Activity   Alcohol use: No   Drug use: No   Sexual activity: Not Currently  Other Topics Concern   Not on file  Social History Narrative   Not on file   Social Drivers of Health   Financial Resource Strain: Low Risk  (08/19/2023)   Overall Financial Resource Strain (CARDIA)    Difficulty of Paying Living Expenses: Not hard at all  Food Insecurity: No Food Insecurity (08/19/2023)   Hunger Vital Sign    Worried About Running Out of Food in the Last Year: Never true    Ran Out of Food in the Last Year: Never true  Transportation Needs: No Transportation Needs (08/19/2023)   PRAPARE - Administrator, Civil Service (Medical): No    Lack of Transportation (Non-Medical): No  Physical Activity: Sufficiently Active (08/19/2023)   Exercise Vital Sign    Days of Exercise per Week: 4 days    Minutes of Exercise per Session: 40 min  Stress: No Stress Concern Present (08/19/2023)   [employer] of Occupational Health - Occupational Stress Questionnaire    Feeling of Stress : Only a little  Social Connections: Moderately Integrated (08/19/2023)   Social Connection and Isolation Panel [NHANES]    Frequency of Communication with Friends and Family: More than three times a week    Frequency of Social Gatherings with Friends and Family:  Twice a week    Attends Religious Services: More than 4 times per year    Active Member of [employer] or Organizations: Yes    Attends Banker Meetings: Not on file    Marital Status: Widowed  Intimate Partner Violence: Not At Risk (08/19/2023)   Humiliation, Afraid, Rape, and Kick questionnaire    Fear of Current or Ex-Partner: No    Emotionally Abused: No    Physically Abused: No    Sexually Abused: No    ROS Negative unless specially indicated above in HPI.      Objective    BP 127/71   Pulse 77   Temp 98.1 F (36.7 C) (Temporal)   Ht 5\' 2"  (1.575 m)   Wt 176 lb 9.6 oz (80.1 kg)   SpO2 96%   BMI 32.30 kg/m   Physical Exam Vitals and nursing note reviewed.  Constitutional:      General: She is not in acute distress.    Appearance: Normal appearance. She is not ill-appearing, toxic-appearing or diaphoretic.  Cardiovascular:     Rate and Rhythm: Normal rate and regular rhythm.     Pulses: Normal pulses.     Heart sounds: Normal heart sounds. No murmur heard. Pulmonary:     Effort: Pulmonary effort is normal. No respiratory distress.     Breath sounds: Normal breath sounds.  Abdominal:     General: Bowel sounds are normal. There is no distension.     Palpations: Abdomen is soft. There is no mass.     Tenderness: There is no abdominal tenderness. There is no guarding or rebound.  Musculoskeletal:     Cervical back: No tenderness.     Right lower leg: No edema.     Left lower leg: No edema.  Lymphadenopathy:     Cervical: No  cervical adenopathy.  Skin:    General: Skin is warm and dry.  Neurological:     General: No focal deficit present.     Mental Status: She is alert and oriented to person, place, and time.  Psychiatric:        Mood and Affect: Mood normal.        Behavior: Behavior normal.        Thought Content: Thought content normal.        Judgment: Judgment normal.         Assessment & Plan:   Alliana was seen today for anxiety and  depression.  Diagnoses and all orders for this visit:  Primary hypertension Well controlled on current regimen.   Mixed hyperlipidemia Reviewed recent lipid panel with LDL at 74.  Class 1 obesity due to excess calories with serious comorbidity and body mass index (BMI) of 32.0 to 32.9 in adult Diet, exercise, weight loss.   Gastroesophageal reflux disease without esophagitis Continue pepcid BID prn.   Depression, recurrent (HCC) Stable. Denies SI. Continue wellbutrin and celexa.   Anxiety Can try hydroxyzine prn. Discussed can cause drowsiness.  -     hydrOXYzine (ATARAX) 10 MG tablet; Take 1 tablet (10 mg total) by mouth 3 (three) times daily as needed.  History of thyroid cancer Ductal carcinoma in situ (DCIS) of left breast  Hx of melanoma of skin Referral to derm discussed and placed. Last skin check 3 years ago.  -     Ambulatory referral to Dermatology  Return in about 9 months (around 08/18/2024) for CPE.   The patient indicates understanding of these issues and agrees with the plan.  Gabriel Earing, FNP

## 2023-12-07 ENCOUNTER — Ambulatory Visit (INDEPENDENT_AMBULATORY_CARE_PROVIDER_SITE_OTHER): Admitting: Family Medicine

## 2023-12-07 ENCOUNTER — Encounter: Payer: Self-pay | Admitting: Family Medicine

## 2023-12-07 VITALS — BP 137/72 | HR 66 | Temp 97.8°F | Ht 62.0 in | Wt 177.0 lb

## 2023-12-07 DIAGNOSIS — J0110 Acute frontal sinusitis, unspecified: Secondary | ICD-10-CM

## 2023-12-07 DIAGNOSIS — R051 Acute cough: Secondary | ICD-10-CM | POA: Diagnosis not present

## 2023-12-07 DIAGNOSIS — F339 Major depressive disorder, recurrent, unspecified: Secondary | ICD-10-CM | POA: Diagnosis not present

## 2023-12-07 MED ORDER — BENZONATATE 100 MG PO CAPS: 100.0000 mg | ORAL_CAPSULE | Freq: Three times a day (TID) | ORAL | 0 refills | Status: DC | PRN

## 2023-12-07 MED ORDER — AMOXICILLIN-POT CLAVULANATE 875-125 MG PO TABS: 1.0000 | ORAL_TABLET | Freq: Two times a day (BID) | ORAL | 0 refills | Status: AC

## 2023-12-07 NOTE — Progress Notes (Signed)
 Subjective:  Patient ID: Ariel Henderson, female    DOB: 02/12/1946, 78 y.o.   MRN: [id number]  Patient Care Team: Gabriel Earing, FNP as PCP - General (Family Medicine)   Chief Complaint:  cough and runny nose (X 1 week)  HPI: Ariel Henderson is a 78 y.o. female presenting on 12/07/2023 for cough and runny nose (X 1 week) States that symptoms started one week ago. Reports that she has fatigue, coughing, headaches, rhinorrhea. Denies sputum. Endorses pain from coughing. Denies fever, ear pain, N/V/D. Negative covid at home. Taking cough drops, nyquil, cough syrup, motrin. Reports that tylenol does not work for her.   Relevant past medical, surgical, family, and social history reviewed and updated as indicated.  Allergies and medications reviewed and updated. Data reviewed: Chart in Epic.   Past Medical History:  Diagnosis Date   Breast cancer (HCC) 12/29/12 bx   left breast ductal,high grade ductal ca in situ w/asoc comedo necrosis   Cancer (HCC)    cervical per patient   Chronic anxiety    Complication of anesthesia    "slow to wake up"   Dyslipidemia    Fibrocystic breast disease    GERD (gastroesophageal reflux disease)    History of recurrent UTIs    Hypertension    Hypothyroidism    Major depression    Melanoma (HCC)    per patient on 02/01/13; located on back; dx in 40s   Migraine headache    Multinodular goiter    Peripheral vascular disease (HCC)    varicose veins   Pneumonia    hx   Seasonal allergies    Thyroid cancer (HCC)    per patient on 02/01/2013; dx in 30s    Past Surgical History:  Procedure Laterality Date   ABDOMINAL HYSTERECTOMY     BREAST DUCTAL SYSTEM EXCISION Left 12/29/2012   Procedure: EXCISION DUCTAL SYSTEM BREAST;  Surgeon: Currie Paris, MD;  Location: [facility];  Service: General;  Laterality: Left;   BREAST LUMPECTOMY     left   x 3, right x 2   BREAST LUMPECTOMY Left 12/06/2009   lower inner=fibrocystic changes/sclerosing  adenosis   BREAST RECONSTRUCTION WITH PLACEMENT OF TISSUE EXPANDER AND FLEX HD (ACELLULAR HYDRATED DERMIS) Left 02/09/2013   Procedure: LEFT BREAST RECONSTRUCTION WITH PLACEMENT OF TISSUE EXPANDER ;  Surgeon: Etter Sjogren, MD;  Location: [facility] OR;  Service: Plastics;  Laterality: Left;   CHOLECYSTECTOMY N/A 09/30/2020   Procedure: LAPAROSCOPIC CHOLECYSTECTOMY;  Surgeon: Franky Macho, MD;  Location: [facility];  Service: General;  Laterality: N/A;   EYE SURGERY Bilateral    cataract extraction with IOL   LEFT HEART CATHETERIZATION WITH CORONARY ANGIOGRAM N/A 10/10/2014   Procedure: LEFT HEART CATHETERIZATION WITH CORONARY ANGIOGRAM;  Surgeon: Peter M Swaziland, MD;  Location: [facility] CATH LAB;  Service: Cardiovascular;  Laterality: N/A;   MASTECTOMY Left 2014   MASTECTOMY W/ SENTINEL NODE BIOPSY Left 02/09/2013   Procedure: TOTAL MASTECTOMY WITH SENTINEL LYMPH NODE BIOPSY;  Surgeon: Currie Paris, MD;  Location: [facility] OR;  Service: General;  Laterality: Left;   THYROIDECTOMY  09/07/1989   TOTAL MASTECTOMY Left 02/09/2013   WITH TISSUE EXPANDER & BIOPSY   WRIST FRACTURE SURGERY      Social History   Socioeconomic History   Marital status: Widowed    Spouse name: Not on file   Number of children: 1   Years of education: 26   Highest education level: High school graduate  Occupational  History   Not on file  Tobacco Use   Smoking status: Never   Smokeless tobacco: Never  Substance and Sexual Activity   Alcohol use: No   Drug use: No   Sexual activity: Not Currently  Other Topics Concern   Not on file  Social History Narrative   Not on file   Social Drivers of Health   Financial Resource Strain: Low Risk  (08/19/2023)   Overall Financial Resource Strain (CARDIA)    Difficulty of Paying Living Expenses: Not hard at all  Food Insecurity: No Food Insecurity (08/19/2023)   Hunger Vital Sign    Worried About Running Out of Food in the Last Year: Never true    Ran Out of Food in the Last  Year: Never true  Transportation Needs: No Transportation Needs (08/19/2023)   PRAPARE - Administrator, Civil Service (Medical): No    Lack of Transportation (Non-Medical): No  Physical Activity: Sufficiently Active (08/19/2023)   Exercise Vital Sign    Days of Exercise per Week: 4 days    Minutes of Exercise per Session: 40 min  Stress: No Stress Concern Present (08/19/2023)   [employer] of Occupational Health - Occupational Stress Questionnaire    Feeling of Stress : Only a little  Social Connections: Moderately Integrated (08/19/2023)   Social Connection and Isolation Panel [NHANES]    Frequency of Communication with Friends and Family: More than three times a week    Frequency of Social Gatherings with Friends and Family: Twice a week    Attends Religious Services: More than 4 times per year    Active Member of [employer] or Organizations: Yes    Attends Banker Meetings: Not on file    Marital Status: Widowed  Intimate Partner Violence: Not At Risk (08/19/2023)   Humiliation, Afraid, Rape, and Kick questionnaire    Fear of Current or Ex-Partner: No    Emotionally Abused: No    Physically Abused: No    Sexually Abused: No    Outpatient Encounter Medications as of 12/07/2023  Medication Sig   buPROPion (WELLBUTRIN XL) 150 MG 24 hr tablet Take 1 tablet (150 mg total) by mouth daily.   Calcium Carbonate-Vit D-Min (CALCIUM 1200 PO) Take 1,000 mg by mouth daily.    citalopram (CELEXA) 40 MG tablet TAKE 1 TABLET BY MOUTH DAILY   famotidine (PEPCID) 20 MG tablet Take 20 mg by mouth daily.   fluticasone (FLONASE) 50 MCG/ACT nasal spray Place 2 sprays into both nostrils daily.   hydrOXYzine (ATARAX) 10 MG tablet Take 1 tablet (10 mg total) by mouth 3 (three) times daily as needed.   levothyroxine (SYNTHROID) 112 MCG tablet Take 1 tablet (112 mcg total) by mouth daily.   lovastatin (MEVACOR) 40 MG tablet Take 1 tablet (40 mg total) by mouth at bedtime.    nitroGLYCERIN (NITROSTAT) 0.4 MG SL tablet Place 1 tablet (0.4 mg total) under the tongue every 5 (five) minutes as needed for chest pain.   propranolol (INDERAL) 40 MG tablet Take 1 tablet (40 mg total) by mouth daily.   SUMAtriptan (IMITREX) 100 MG tablet Take 1 tablet (100 mg total) by mouth every 2 (two) hours as needed for migraine. May repeat in 2 hours if headache persists or recurs.   No facility-administered encounter medications on file as of 12/07/2023.    Allergies  Allergen Reactions   Aspirin Nausea Only and Other (See Comments)    Non coated CAN TAKE 81 MG  Review of Systems As per HPI  Objective:  BP 137/72   Pulse 66   Temp 97.8 F (36.6 C)   Ht 5\' 2"  (1.575 m)   Wt 177 lb (80.3 kg)   SpO2 95%   BMI 32.37 kg/m    Wt Readings from Last 3 Encounters:  12/07/23 177 lb (80.3 kg)  11/08/23 176 lb 9.6 oz (80.1 kg)  08/19/23 176 lb (79.8 kg)    Physical Exam Constitutional:      General: She is awake. She is not in acute distress.    Appearance: Normal appearance. She is well-developed and well-groomed. She is ill-appearing. She is not toxic-appearing or diaphoretic.  HENT:     Right Ear: A middle ear effusion is present. Tympanic membrane is injected.     Left Ear: A middle ear effusion is present. Tympanic membrane is injected.     Nose: Congestion and rhinorrhea present. Rhinorrhea is clear.     Right Sinus: Maxillary sinus tenderness and frontal sinus tenderness present.     Left Sinus: Maxillary sinus tenderness and frontal sinus tenderness present.     Mouth/Throat:     Lips: Pink. No lesions.     Mouth: Mucous membranes are moist.     Tongue: No lesions.     Palate: No mass.     Pharynx: Posterior oropharyngeal erythema present. No pharyngeal swelling, oropharyngeal exudate, uvula swelling or postnasal drip.     Tonsils: No tonsillar exudate or tonsillar abscesses.  Cardiovascular:     Rate and Rhythm: Normal rate and regular rhythm.     Heart  sounds: Normal heart sounds.  Pulmonary:     Effort: Pulmonary effort is normal.     Breath sounds: Normal breath sounds. No stridor, decreased air movement or transmitted upper airway sounds. No decreased breath sounds, wheezing, rhonchi or rales.  Lymphadenopathy:     Head:     Right side of head: No submental, submandibular, tonsillar, preauricular or posterior auricular adenopathy.     Left side of head: No submental, submandibular, tonsillar, preauricular or posterior auricular adenopathy.     Cervical:     Right cervical: No superficial or deep cervical adenopathy.    Left cervical: No superficial or deep cervical adenopathy.  Skin:    General: Skin is warm.     Capillary Refill: Capillary refill takes less than 2 seconds.  Neurological:     General: No focal deficit present.     Mental Status: She is alert, oriented to person, place, and time and easily aroused. Mental status is at baseline.  Psychiatric:        Attention and Perception: Attention and perception normal.        Mood and Affect: Mood and affect normal.        Speech: Speech normal.        Behavior: Behavior normal. Behavior is cooperative.        Thought Content: Thought content normal.        Cognition and Memory: Cognition and memory normal.        Judgment: Judgment normal.      Results for orders placed or performed in visit on 08/27/23  CBC with Differential/Platelet   Collection Time: 08/27/23 11:05 AM  Result Value Ref Range   WBC 12.8 (H) 3.4 - 10.8 x10E3/uL   RBC 3.93 3.77 - 5.28 x10E6/uL   Hemoglobin 11.5 11.1 - 15.9 g/dL   Hematocrit 83.1 51.7 - 46.6 %   MCV 92 79 -  97 fL   MCH 29.3 26.6 - 33.0 pg   MCHC 31.8 31.5 - 35.7 g/dL   RDW 16.1 09.6 - 04.5 %   Platelets 435 150 - 450 x10E3/uL   Neutrophils 69 Not Estab. %   Lymphs 18 Not Estab. %   Monocytes 7 Not Estab. %   Eos 2 Not Estab. %   Basos 1 Not Estab. %   Neutrophils Absolute 8.9 (H) 1.4 - 7.0 x10E3/uL   Lymphocytes Absolute 2.3  0.7 - 3.1 x10E3/uL   Monocytes Absolute 0.9 0.1 - 0.9 x10E3/uL   EOS (ABSOLUTE) 0.2 0.0 - 0.4 x10E3/uL   Basophils Absolute 0.1 0.0 - 0.2 x10E3/uL   Immature Granulocytes 3 Not Estab. %   Immature Grans (Abs) 0.3 (H) 0.0 - 0.1 x10E3/uL       12/07/2023    9:45 AM 11/08/2023    1:56 PM 08/19/2023   11:31 AM 06/15/2022   10:45 AM 06/06/2021   11:05 AM  Depression screen PHQ 2/9  Decreased Interest 0 2 0 0 0  Down, Depressed, Hopeless 0 1 0 1 1  PHQ - 2 Score 0 3 0 1 1  Altered sleeping 2 3  3    Tired, decreased energy 2 0  0   Change in appetite 0 0  1   Feeling bad or failure about yourself  2 0  0   Trouble concentrating 2 1  0   Moving slowly or fidgety/restless 0 1  0   Suicidal thoughts 0 0  0   PHQ-9 Score 8 8  5    Difficult doing work/chores  Not difficult at all  Not difficult at all        12/07/2023    9:46 AM 11/08/2023    1:57 PM  GAD 7 : Generalized Anxiety Score  Nervous, Anxious, on Edge 1 3  Control/stop worrying 0 3  Worry too much - different things 0 3  Trouble relaxing 1 3  Restless 1 3  Easily annoyed or irritable 0 3  Afraid - awful might happen 0 0  Total GAD 7 Score 3 18  Anxiety Difficulty  Somewhat difficult   Pertinent labs & imaging results that were available during my care of the patient were reviewed by me and considered in my medical decision making.  Assessment & Plan:  Ariel Henderson was seen today for cough and runny nose.  Diagnoses and all orders for this visit:  Acute non-recurrent frontal sinusitis Will start medications as below. Recommend patient to continue at home supportive care.  -     benzonatate (TESSALON PERLES) 100 MG capsule; Take 1 capsule (100 mg total) by mouth 3 (three) times daily as needed. -     amoxicillin-clavulanate (AUGMENTIN) 875-125 MG tablet; Take 1 tablet by mouth 2 (two) times daily for 7 days.  Acute cough As above.  -     benzonatate (TESSALON PERLES) 100 MG capsule; Take 1 capsule (100 mg total) by mouth 3  (three) times daily as needed.  Depression, recurrent (HCC) Symptoms stable. Denies Henderson. Patient to follow up with PCP     Continue all other maintenance medications.  Follow up plan: Return if symptoms worsen or fail to improve.   Continue healthy lifestyle choices, including diet (rich in fruits, vegetables, and lean proteins, and low in salt and simple carbohydrates) and exercise (at least 30 minutes of moderate physical activity daily).  Written and verbal instructions provided   The above assessment and management plan  was discussed with the patient. The patient verbalized understanding of and has agreed to the management plan. Patient is aware to call the clinic if they develop any new symptoms or if symptoms persist or worsen. Patient is aware when to return to the clinic for a follow-up visit. Patient educated on when it is appropriate to go to the emergency department.   Neale Burly, DNP-FNP [facility] [address] [place], [place] [address] [phone]

## 2024-02-17 ENCOUNTER — Encounter: Payer: Medicare Other | Admitting: Family Medicine

## 2024-02-23 DIAGNOSIS — S92351A Displaced fracture of fifth metatarsal bone, right foot, initial encounter for closed fracture: Secondary | ICD-10-CM | POA: Diagnosis not present

## 2024-02-29 DIAGNOSIS — S92354A Nondisplaced fracture of fifth metatarsal bone, right foot, initial encounter for closed fracture: Secondary | ICD-10-CM | POA: Diagnosis not present

## 2024-03-08 ENCOUNTER — Other Ambulatory Visit: Payer: Self-pay | Admitting: Family Medicine

## 2024-03-08 DIAGNOSIS — S92301A Fracture of unspecified metatarsal bone(s), right foot, initial encounter for closed fracture: Secondary | ICD-10-CM | POA: Diagnosis not present

## 2024-03-08 DIAGNOSIS — F419 Anxiety disorder, unspecified: Secondary | ICD-10-CM

## 2024-03-29 DIAGNOSIS — S92301D Fracture of unspecified metatarsal bone(s), right foot, subsequent encounter for fracture with routine healing: Secondary | ICD-10-CM | POA: Diagnosis not present

## 2024-04-19 ENCOUNTER — Other Ambulatory Visit: Payer: Self-pay | Admitting: Family Medicine

## 2024-04-19 DIAGNOSIS — F329 Major depressive disorder, single episode, unspecified: Secondary | ICD-10-CM

## 2024-05-02 ENCOUNTER — Other Ambulatory Visit: Payer: Self-pay | Admitting: Family Medicine

## 2024-05-02 DIAGNOSIS — F329 Major depressive disorder, single episode, unspecified: Secondary | ICD-10-CM

## 2024-05-03 ENCOUNTER — Other Ambulatory Visit: Payer: Self-pay | Admitting: Family Medicine

## 2024-05-03 DIAGNOSIS — Z85850 Personal history of malignant neoplasm of thyroid: Secondary | ICD-10-CM

## 2024-05-05 ENCOUNTER — Ambulatory Visit

## 2024-05-05 VITALS — BP 137/72 | HR 66 | Ht 62.0 in | Wt 177.0 lb

## 2024-05-05 DIAGNOSIS — Z0000 Encounter for general adult medical examination without abnormal findings: Secondary | ICD-10-CM

## 2024-05-05 NOTE — Progress Notes (Signed)
 Subjective:   Ariel Henderson is a 78 y.o. who presents for a Medicare Wellness preventive visit.  As a reminder, Annual Wellness Visits don't include a physical exam, and some assessments may be limited, especially if this visit is performed virtually. We may recommend an in-person follow-up visit with your provider if needed.  Visit Complete: Virtual I connected with  Ariel Henderson on 05/05/24 by a audio enabled telemedicine application and verified that I am speaking with the correct person using two identifiers.  Patient Location: Home  Provider Location: Home Office  I discussed the limitations of evaluation and management by telemedicine. The patient expressed understanding and agreed to proceed.  Vital Signs: Because this visit was a virtual/telehealth visit, some criteria may be missing or patient reported. Any vitals not documented were not able to be obtained and vitals that have been documented are patient reported.  VideoDeclined- This patient declined Librarian, academic. Therefore the visit was completed with audio only.  Persons Participating in Visit: Patient.  AWV Questionnaire: No: Patient Medicare AWV questionnaire was not completed prior to this visit.  Cardiac Risk Factors include: advanced age (>84men, >41 women);dyslipidemia;hypertension;obesity (BMI >30kg/m2)     Objective:    Today's Vitals   05/05/24 0921  BP: 137/72  Pulse: 66  Weight: 177 lb (80.3 kg)  Height: 5' 2 (1.575 m)   Body mass index is 32.37 kg/m.     05/05/2024    9:29 AM 06/15/2022   10:44 AM 06/06/2021   11:02 AM 09/30/2020    7:50 AM 09/27/2020   10:00 PM 09/27/2020   10:52 AM 05/28/2020    2:12 PM  Advanced Directives  Does Patient Have a Medical Advance Directive? Yes Yes Yes No No No Yes  Type of Estate agent of [place];Living will Living will;Healthcare Power of Attorney Living will    Living will  Does patient want to  make changes to medical advance directive?  No - Patient declined No - Patient declined      Copy of Healthcare Power of Attorney in Chart? No - copy requested No - copy requested       Would patient like information on creating a medical advance directive?    No - Patient declined No - Patient declined      Current Medications (verified) Outpatient Encounter Medications as of 05/05/2024  Medication Sig   benzonatate  (TESSALON  PERLES) 100 MG capsule Take 1 capsule (100 mg total) by mouth 3 (three) times daily as needed.   buPROPion  (WELLBUTRIN  XL) 150 MG 24 hr tablet Take 1 tablet (150 mg total) by mouth daily.   Calcium  Carbonate-Vit D-Min (CALCIUM  1200 PO) Take 1,000 mg by mouth daily.    citalopram  (CELEXA ) 40 MG tablet TAKE 1 TABLET BY MOUTH DAILY   famotidine  (PEPCID ) 20 MG tablet Take 20 mg by mouth daily.   fluticasone  (FLONASE ) 50 MCG/ACT nasal spray Place 2 sprays into both nostrils daily.   hydrOXYzine  (ATARAX ) 10 MG tablet TAKE 1 TABLET BY MOUTH 3 TIMES  DAILY AS NEEDED   levothyroxine  (SYNTHROID ) 112 MCG tablet TAKE 1 TABLET BY MOUTH DAILY   lovastatin  (MEVACOR ) 40 MG tablet Take 1 tablet (40 mg total) by mouth at bedtime.   nitroGLYCERIN  (NITROSTAT ) 0.4 MG SL tablet Place 1 tablet (0.4 mg total) under the tongue every 5 (five) minutes as needed for chest pain.   propranolol  (INDERAL ) 40 MG tablet Take 1 tablet (40 mg total) by mouth daily.  SUMAtriptan  (IMITREX ) 100 MG tablet Take 1 tablet (100 mg total) by mouth every 2 (two) hours as needed for migraine. May repeat in 2 hours if headache persists or recurs.   No facility-administered encounter medications on file as of 05/05/2024.    Allergies (verified) Aspirin    History: Past Medical History:  Diagnosis Date   Breast cancer (HCC) 12/29/12 bx   left breast ductal,high grade ductal ca in situ w/asoc comedo necrosis   Cancer (HCC)    cervical per patient   Chronic anxiety    Complication of anesthesia    slow to wake  up   Dyslipidemia    Fibrocystic breast disease    GERD (gastroesophageal reflux disease)    History of recurrent UTIs    Hypertension    Hypothyroidism    Major depression    Melanoma (HCC)    per patient on 02/01/13; located on back; dx in 40s   Migraine headache    Multinodular goiter    Peripheral vascular disease (HCC)    varicose veins   Pneumonia    hx   Seasonal allergies    Thyroid  cancer (HCC)    per patient on 02/01/2013; dx in 30s   Past Surgical History:  Procedure Laterality Date   ABDOMINAL HYSTERECTOMY     BREAST DUCTAL SYSTEM EXCISION Left 12/29/2012   Procedure: EXCISION DUCTAL SYSTEM BREAST;  Surgeon: Sherlean JINNY Laughter, MD;  Location: [facility] ORS;  Service: General;  Laterality: Left;   BREAST LUMPECTOMY     left   x 3, right x 2   BREAST LUMPECTOMY Left 12/06/2009   lower inner=fibrocystic changes/sclerosing adenosis   BREAST RECONSTRUCTION WITH PLACEMENT OF TISSUE EXPANDER AND FLEX HD (ACELLULAR HYDRATED DERMIS) Left 02/09/2013   Procedure: LEFT BREAST RECONSTRUCTION WITH PLACEMENT OF TISSUE EXPANDER ;  Surgeon: Alm Sick, MD;  Location: [facility] OR;  Service: Plastics;  Laterality: Left;   CHOLECYSTECTOMY N/A 09/30/2020   Procedure: LAPAROSCOPIC CHOLECYSTECTOMY;  Surgeon: Mavis Anes, MD;  Location: [facility];  Service: General;  Laterality: N/A;   EYE SURGERY Bilateral    cataract extraction with IOL   LEFT HEART CATHETERIZATION WITH CORONARY ANGIOGRAM N/A 10/10/2014   Procedure: LEFT HEART CATHETERIZATION WITH CORONARY ANGIOGRAM;  Surgeon: Peter M [place], MD;  Location: [facility] CATH LAB;  Service: Cardiovascular;  Laterality: N/A;   MASTECTOMY Left 2014   MASTECTOMY W/ SENTINEL NODE BIOPSY Left 02/09/2013   Procedure: TOTAL MASTECTOMY WITH SENTINEL LYMPH NODE BIOPSY;  Surgeon: Sherlean JINNY Laughter, MD;  Location: [facility] OR;  Service: General;  Laterality: Left;   THYROIDECTOMY  09/07/1989   TOTAL MASTECTOMY Left 02/09/2013   WITH TISSUE EXPANDER & BIOPSY   WRIST  FRACTURE SURGERY     Family History  Problem Relation Age of Onset   Lung cancer Father 59       smoker   Ovarian cancer Sister 45   Brain cancer Sister 9   Colon cancer Brother 21   Prostate cancer Brother 5   Prostate cancer Brother 32   Prostate cancer Brother 62   Prostate cancer Brother 34   Prostate cancer Brother 50   Cancer Brother 63       metastatic; unknown primary   Social History   Socioeconomic History   Marital status: Widowed    Spouse name: Not on file   Number of children: 1   Years of education: 12   Highest education level: High school graduate  Occupational History   Not on file  Tobacco  Use   Smoking status: Never   Smokeless tobacco: Never  Substance and Sexual Activity   Alcohol use: No   Drug use: No   Sexual activity: Not Currently  Other Topics Concern   Not on file  Social History Narrative   Not on file   Social Drivers of Health   Financial Resource Strain: Low Risk  (05/05/2024)   Overall Financial Resource Strain (CARDIA)    Difficulty of Paying Living Expenses: Not hard at all  Food Insecurity: No Food Insecurity (05/05/2024)   Hunger Vital Sign    Worried About Running Out of Food in the Last Year: Never true    Ran Out of Food in the Last Year: Never true  Transportation Needs: No Transportation Needs (05/05/2024)   PRAPARE - Administrator, Civil Service (Medical): No    Lack of Transportation (Non-Medical): No  Physical Activity: Insufficiently Active (05/05/2024)   Exercise Vital Sign    Days of Exercise per Week: 3 days    Minutes of Exercise per Session: 30 min  Stress: No Stress Concern Present (05/05/2024)   [employer] of Occupational Health - Occupational Stress Questionnaire    Feeling of Stress: Only a little  Social Connections: Moderately Integrated (05/05/2024)   Social Connection and Isolation Panel    Frequency of Communication with Friends and Family: More than three times a week     Frequency of Social Gatherings with Friends and Family: More than three times a week    Attends Religious Services: More than 4 times per year    Active Member of [employer] or Organizations: No    Attends Engineer, structural: More than 4 times per year    Marital Status: Widowed    Tobacco Counseling Counseling given: Yes    Clinical Intake:  Pre-visit preparation completed: Yes  Pain : No/denies pain     BMI - recorded: 32.37 Nutritional Status: BMI > 30  Obese Nutritional Risks: None Diabetes: No  Lab Results  Component Value Date   HGBA1C 5.8 (A) 06/16/2022     How often do you need to have someone help you when you read instructions, pamphlets, or other written materials from your doctor or pharmacy?: 1 - Never  Interpreter Needed?: No  Information entered by :: alia t/cma   Activities of Daily Living     05/05/2024    9:25 AM 08/19/2023   11:32 AM  In your present state of health, do you have any difficulty performing the following activities:  Hearing? 0 0  Vision? 0 0  Difficulty concentrating or making decisions? 0 1  Comment  remembering  Walking or climbing stairs? 1 1  Comment pt don't like to use stairs arthiritis in knees  Dressing or bathing? 0 0  Doing errands, shopping? 0 0  Preparing Food and eating ? N N  Using the Toilet? N N  In the past six months, have you accidently leaked urine? Y Y  Comment  when coughing  Do you have problems with loss of bowel control? N N  Managing your Medications? N N  Managing your Finances? N N  Housekeeping or managing your Housekeeping? N N    Patient Care Team: Joesph Annabella HERO, FNP as PCP - General (Family Medicine)  I have updated your Care Teams any recent Medical Services you may have received from other providers in the past year.     Assessment:   This is a routine wellness examination for  Ariel Henderson.  Hearing/Vision screen Hearing Screening - Comments:: Pt denies hearing dif Vision  Screening - Comments:: Pt denies vision dif/pt have not eye apt for years   Goals Addressed             This Visit's Progress    Patient Stated       Continue to remain health and enjoy life       Depression Screen     05/05/2024    9:40 AM 12/07/2023    9:45 AM 11/08/2023    1:56 PM 08/19/2023   11:31 AM 06/15/2022   10:45 AM 06/06/2021   11:05 AM 05/28/2020    2:15 PM  PHQ 2/9 Scores  PHQ - 2 Score 0 0 3 0 1 1 1   PHQ- 9 Score  8 8  5       Fall Risk     05/05/2024    9:23 AM 12/07/2023    9:45 AM 11/08/2023    1:54 PM 08/19/2023   11:31 AM 06/15/2022   10:45 AM  Fall Risk   Falls in the past year? 0 0 0 0 0  Number falls in past yr: 0 0  0 0  Injury with Fall? 0 0  0 0  Risk for fall due to : No Fall Risks No Fall Risks   History of fall(s)  Follow up Falls evaluation completed Falls evaluation completed   Falls evaluation completed      Data saved with a previous flowsheet row definition    MEDICARE RISK AT HOME:  Medicare Risk at Home Any stairs in or around the home?: No If so, are there any without handrails?: No Home free of loose throw rugs in walkways, pet beds, electrical cords, etc?: Yes Adequate lighting in your home to reduce risk of falls?: Yes Life alert?: No Use of a cane, walker or w/c?: No Grab bars in the bathroom?: Yes Shower chair or bench in shower?: Yes Elevated toilet seat or a handicapped toilet?: No  TIMED UP AND GO:  Was the test performed?  no  Cognitive Function: 6CIT completed        05/05/2024    9:40 AM 06/15/2022   10:51 AM  6CIT Screen  What Year? 0 points 0 points  What month? 0 points 0 points  What time? 0 points 0 points  Count back from 20 0 points 0 points  Months in reverse 0 points 0 points  Repeat phrase 0 points 0 points  Total Score 0 points 0 points    Immunizations Immunization History  Administered Date(s) Administered   Fluad Quad(high Dose 65+) 08/15/2019, 05/28/2020, 06/06/2021, 06/15/2022   Fluad  Trivalent(High Dose 65+) 08/19/2023   INFLUENZA, HIGH DOSE SEASONAL PF 05/03/2017, 05/31/2018   Influenza Split 04/17/2013, 06/28/2013, 05/11/2014   Influenza-Unspecified 07/23/2010, 05/22/2011, 05/13/2012   PFIZER(Purple Top)SARS-COV-2 Vaccination 05/28/2020, 06/20/2020   Pneumococcal Conjugate-13 09/28/2013, 01/19/2015   Pneumococcal Polysaccharide-23 10/23/2010   Td 02/13/2002, 04/27/2012   Zoster Recombinant(Shingrix) 12/17/2016, 02/26/2017   Zoster, Live 10/28/2011    Screening Tests Health Maintenance  Topic Date Due   DEXA SCAN  Never done   INFLUENZA VACCINE  04/07/2024   DTaP/Tdap/Td (3 - Tdap) 11/07/2024 (Originally 04/27/2022)   COVID-19 Vaccine (3 - Pfizer risk series) 11/23/2024 (Originally 07/18/2020)   Medicare Annual Wellness (AWV)  05/05/2025   Pneumococcal Vaccine: 50+ Years  Completed   Hepatitis C Screening  Completed   Zoster Vaccines- Shingrix  Completed   HPV VACCINES  Aged Out  Meningococcal B Vaccine  Aged Out    Health Maintenance  Health Maintenance Due  Topic Date Due   DEXA SCAN  Never done   INFLUENZA VACCINE  04/07/2024   Health Maintenance Items Addressed: See Nurse Notes at the end of this note  Additional Screening:  Vision Screening: Recommended annual ophthalmology exams for early detection of glaucoma and other disorders of the eye. Would you like a referral to an eye doctor? No    Dental Screening: Recommended annual dental exams for proper oral hygiene  Community Resource Referral / Chronic Care Management: CRR required this visit?  No   CCM required this visit?  No   Plan:    I have personally reviewed and noted the following in the patient's chart:   Medical and social history Use of alcohol, tobacco or illicit drugs  Current medications and supplements including opioid prescriptions. Patient is not currently taking opioid prescriptions. Functional ability and status Nutritional status Physical activity Advanced  directives List of other physicians Hospitalizations, surgeries, and ER visits in previous 12 months Vitals Screenings to include cognitive, depression, and falls Referrals and appointments  In addition, I have reviewed and discussed with patient certain preventive protocols, quality metrics, and best practice recommendations. A written personalized care plan for preventive services as well as general preventive health recommendations were provided to patient.   Ariel Henderson, CMA   05/05/2024   After Visit Summary: (Declined) Due to this being a telephonic visit, with patients personalized plan was offered to patient but patient Declined AVS at this time   Notes: PCP Follow Up Recommendations: pt is aware and due the following: Dexa-per pt had one done already, there is no record and pt wants to talk to pcp first, will get flu vaccine at next ov

## 2024-05-05 NOTE — Patient Instructions (Addendum)
 Ariel Henderson , Thank you for taking time out of your busy schedule to complete your Annual Wellness Visit with me. I enjoyed our conversation and look forward to speaking with you again next year. I, as well as your care team,  appreciate your ongoing commitment to your health goals. Please review the following plan we discussed and let me know if I can assist you in the future. Your Game plan/ To Do List    Referrals: If you haven't heard from the office you've been referred to, please reach out to them at the phone provided.   Follow up Visits: We will see or speak with you next year for your Next Medicare AWV with our clinical staff on 04/07/25 at 10:40a.m. Have you seen your provider in the last 6 months (3 months if uncontrolled diabetes)? Yes  Clinician Recommendations:  Aim for 30 minutes of exercise or brisk walking, 6-8 glasses of water, and 5 servings of fruits and vegetables each day.       This is a list of the screenings recommended for you:  Health Maintenance  Topic Date Due   DEXA scan (bone density measurement)  Never done   Flu Shot  04/07/2024   DTaP/Tdap/Td vaccine (3 - Tdap) 11/07/2024*   COVID-19 Vaccine (3 - Pfizer risk series) 11/23/2024*   Medicare Annual Wellness Visit  05/05/2025   Pneumococcal Vaccine for age over 44  Completed   Hepatitis C Screening  Completed   Zoster (Shingles) Vaccine  Completed   HPV Vaccine  Aged Out   Meningitis B Vaccine  Aged Out  *Topic was postponed. The date shown is not the original due date.    Advanced directives: (Declined) Advance directive discussed with you today. Even though you declined this today, please call our office should you change your mind, and we can give you the proper paperwork for you to fill out. Advance Care Planning is important because it:  [x]  Makes sure you receive the medical care that is consistent with your values, goals, and preferences  [x]  It provides guidance to your family and loved ones and  reduces their decisional burden about whether or not they are making the right decisions based on your wishes.  Follow the link provided in your after visit summary or read over the paperwork we have mailed to you to help you started getting your Advance Directives in place. If you need assistance in completing these, please reach out to us  so that we can help you!  See attachments for Preventive Care and Fall Prevention Tips.

## 2024-05-09 ENCOUNTER — Other Ambulatory Visit: Payer: Self-pay | Admitting: Family Medicine

## 2024-05-09 DIAGNOSIS — I10 Essential (primary) hypertension: Secondary | ICD-10-CM

## 2024-06-04 ENCOUNTER — Other Ambulatory Visit: Payer: Self-pay | Admitting: Family Medicine

## 2024-06-04 DIAGNOSIS — E785 Hyperlipidemia, unspecified: Secondary | ICD-10-CM

## 2024-06-20 ENCOUNTER — Other Ambulatory Visit: Payer: Self-pay | Admitting: Family Medicine

## 2024-06-20 DIAGNOSIS — E785 Hyperlipidemia, unspecified: Secondary | ICD-10-CM

## 2024-07-09 ENCOUNTER — Other Ambulatory Visit: Payer: Self-pay | Admitting: Family Medicine

## 2024-07-09 DIAGNOSIS — F329 Major depressive disorder, single episode, unspecified: Secondary | ICD-10-CM

## 2024-07-09 DIAGNOSIS — I10 Essential (primary) hypertension: Secondary | ICD-10-CM

## 2024-08-01 ENCOUNTER — Other Ambulatory Visit: Payer: Self-pay | Admitting: Family Medicine

## 2024-08-01 DIAGNOSIS — E785 Hyperlipidemia, unspecified: Secondary | ICD-10-CM

## 2024-08-24 ENCOUNTER — Encounter: Payer: Self-pay | Admitting: Family Medicine

## 2024-08-24 ENCOUNTER — Ambulatory Visit: Payer: Self-pay | Admitting: Family Medicine

## 2024-08-24 VITALS — BP 133/71 | HR 79 | Temp 98.1°F | Ht 62.0 in | Wt 165.8 lb

## 2024-08-24 DIAGNOSIS — I10 Essential (primary) hypertension: Secondary | ICD-10-CM

## 2024-08-24 DIAGNOSIS — F419 Anxiety disorder, unspecified: Secondary | ICD-10-CM

## 2024-08-24 DIAGNOSIS — G43009 Migraine without aura, not intractable, without status migrainosus: Secondary | ICD-10-CM | POA: Diagnosis not present

## 2024-08-24 DIAGNOSIS — Z23 Encounter for immunization: Secondary | ICD-10-CM

## 2024-08-24 DIAGNOSIS — E782 Mixed hyperlipidemia: Secondary | ICD-10-CM | POA: Diagnosis not present

## 2024-08-24 DIAGNOSIS — Z85850 Personal history of malignant neoplasm of thyroid: Secondary | ICD-10-CM | POA: Diagnosis not present

## 2024-08-24 DIAGNOSIS — F321 Major depressive disorder, single episode, moderate: Secondary | ICD-10-CM

## 2024-08-24 DIAGNOSIS — Z0000 Encounter for general adult medical examination without abnormal findings: Secondary | ICD-10-CM

## 2024-08-24 DIAGNOSIS — E890 Postprocedural hypothyroidism: Secondary | ICD-10-CM | POA: Diagnosis not present

## 2024-08-24 MED ORDER — PROPRANOLOL HCL 40 MG PO TABS: 40.0000 mg | ORAL_TABLET | Freq: Every day | ORAL | 3 refills | Status: AC

## 2024-08-24 MED ORDER — SUMATRIPTAN SUCCINATE 100 MG PO TABS: 100.0000 mg | ORAL_TABLET | ORAL | 3 refills | Status: AC | PRN

## 2024-08-24 MED ORDER — LEVOTHYROXINE SODIUM 112 MCG PO TABS: 112.0000 ug | ORAL_TABLET | Freq: Every day | ORAL | 3 refills | Status: DC

## 2024-08-24 MED ORDER — HYDROXYZINE HCL 10 MG PO TABS: 10.0000 mg | ORAL_TABLET | Freq: Three times a day (TID) | ORAL | 4 refills | Status: AC | PRN

## 2024-08-24 MED ORDER — BUPROPION HCL ER (XL) 150 MG PO TB24: 150.0000 mg | ORAL_TABLET | Freq: Every day | ORAL | 3 refills | Status: AC

## 2024-08-24 MED ORDER — LOVASTATIN 40 MG PO TABS: 40.0000 mg | ORAL_TABLET | Freq: Every day | ORAL | 3 refills | Status: DC

## 2024-08-24 NOTE — Patient Instructions (Signed)

## 2024-08-24 NOTE — Progress Notes (Signed)
 Complete physical exam  Patient: Ariel Henderson   DOB: [birth year]   78 y.o. Female  MRN: [id number]  Subjective:    Chief Complaint  Patient presents with   Annual Exam    Ariel Henderson is a 78 y.o. female who presents today for a complete physical exam. She reports consuming a well balanced diet. She stays active throughout the day. She generally feels well. She reports sleeping well. She does not have additional problems to discuss today.   She reports feeling well. She does report some decreased hearing that has been chronic. She is considering hearing aids in the future but not now.    Most recent fall risk assessment:    08/24/2024   10:57 AM  Fall Risk   Falls in the past year? 0     Most recent depression screenings:    08/24/2024   10:57 AM 05/05/2024    9:40 AM 12/07/2023    9:45 AM  Depression screen PHQ 2/9  Decreased Interest 0 0 0  Down, Depressed, Hopeless 0 0 0  PHQ - 2 Score 0 0 0  Altered sleeping 1  2  Tired, decreased energy 1  2  Change in appetite 0  0  Feeling bad or failure about yourself  0  2  Trouble concentrating 0  2  Moving slowly or fidgety/restless 0  0  Suicidal thoughts 0  0  PHQ-9 Score 2  8   Difficult doing work/chores Not difficult at all       Data saved with a previous flowsheet row definition      08/24/2024   10:58 AM 12/07/2023    9:46 AM 11/08/2023    1:57 PM  GAD 7 : Generalized Anxiety Score  Nervous, Anxious, on Edge 0 1 3  Control/stop worrying 0 0 3  Worry too much - different things 1 0 3  Trouble relaxing 0 1 3  Restless 0 1 3  Easily annoyed or irritable 0 0 3  Afraid - awful might happen 0 0 0  Total GAD 7 Score 1 3 18   Anxiety Difficulty Not difficult at all  Somewhat difficult           Patient Care Team: Joesph Annabella HERO, FNP as PCP - General (Family Medicine)   Show/hide medication list[1]  ROS Negative unless specially indicated above in HPI.    Objective:     BP 133/71   Pulse  79   Temp 98.1 F (36.7 C) (Temporal)   Ht 5' 2 (1.575 m)   Wt 165 lb 12.8 oz (75.2 kg)   SpO2 98%   BMI 30.33 kg/m    Physical Exam Vitals and nursing note reviewed.  Constitutional:      General: She is not in acute distress.    Appearance: Normal appearance. She is not ill-appearing, toxic-appearing or diaphoretic.  HENT:     Head: Normocephalic.     Right Ear: Tympanic membrane, ear canal and external ear normal.     Left Ear: Tympanic membrane, ear canal and external ear normal.     Nose: Nose normal.     Mouth/Throat:     Mouth: Mucous membranes are moist.     Pharynx: Oropharynx is clear.  Eyes:     Extraocular Movements: Extraocular movements intact.     Conjunctiva/sclera: Conjunctivae normal.     Pupils: Pupils are equal, round, and reactive to light.  Cardiovascular:     Rate and Rhythm: Normal  rate and regular rhythm.     Pulses: Normal pulses.     Heart sounds: Normal heart sounds. No murmur heard.    No friction rub. No gallop.  Pulmonary:     Effort: Pulmonary effort is normal.     Breath sounds: Normal breath sounds.  Abdominal:     General: Bowel sounds are normal. There is no distension.     Palpations: Abdomen is soft. There is no mass.     Tenderness: There is no abdominal tenderness. There is no guarding.  Musculoskeletal:     Cervical back: Normal range of motion and neck supple. No tenderness.     Right lower leg: No edema.     Left lower leg: No edema.  Skin:    General: Skin is warm and dry.     Capillary Refill: Capillary refill takes less than 2 seconds.     Findings: No lesion or rash.  Neurological:     General: No focal deficit present.     Mental Status: She is alert and oriented to person, place, and time.     Cranial Nerves: No cranial nerve deficit.     Motor: No weakness.     Gait: Gait normal.  Psychiatric:        Mood and Affect: Mood normal.        Behavior: Behavior normal.        Thought Content: Thought content normal.         Judgment: Judgment normal.      No results found for any visits on 08/24/24.     Assessment & Plan:    Routine Health Maintenance and Physical Exam  Ariel Henderson was seen today for annual exam.  Diagnoses and all orders for this visit:  Routine general medical examination at a health care facility  Primary hypertension Well controlled on current regimen.  -     CBC with Differential/Platelet -     CMP14+EGFR -     propranolol  (INDERAL ) 40 MG tablet; Take 1 tablet (40 mg total) by mouth daily.  Mixed hyperlipidemia On statin. Fasting panel pending.  -     Lipid panel -     lovastatin  (MEVACOR ) 40 MG tablet; Take 1 tablet (40 mg total) by mouth at bedtime.  Depression, major, single episode, moderate (HCC) In remission. Well controlled on current regimen.  -     buPROPion  (WELLBUTRIN  XL) 150 MG 24 hr tablet; Take 1 tablet (150 mg total) by mouth daily. -     citalopram  (CELEXA ) 40 MG tablet; Take 1 tablet (40 mg total) by mouth daily.  Anxiety -     hydrOXYzine  (ATARAX ) 10 MG tablet; Take 1 tablet (10 mg total) by mouth 3 (three) times daily as needed.  Postoperative hypothyroidism History of thyroid  cancer Compliant with levothyroxine . Denies symptoms.  -     TSH -     levothyroxine  (SYNTHROID ) 112 MCG tablet; Take 1 tablet (112 mcg total) by mouth daily.  Migraine without aura and without status migrainosus, not intractable Reports only occasional migraine.  -     SUMAtriptan  (IMITREX ) 100 MG tablet; Take 1 tablet (100 mg total) by mouth every 2 (two) hours as needed for migraine. May repeat in 2 hours if headache persists or recurs.  Encounter for immunization -     Flu vaccine HIGH DOSE PF(Fluzone Trivalent)    Immunization History  Administered Date(s) Administered   Fluad Quad(high Dose 65+) 08/15/2019, 05/28/2020, 06/06/2021, 06/15/2022   Fluad Trivalent(High  Dose 65+) 08/19/2023   INFLUENZA, HIGH DOSE SEASONAL PF 05/03/2017, 05/31/2018, 08/24/2024    Influenza Split 04/17/2013, 06/28/2013, 05/11/2014   Influenza-Unspecified 07/23/2010, 05/22/2011, 05/13/2012   PFIZER(Purple Top)SARS-COV-2 Vaccination 05/28/2020, 06/20/2020   Pneumococcal Conjugate-13 09/28/2013, 01/19/2015   Pneumococcal Polysaccharide-23 10/23/2010   Td 02/13/2002, 04/27/2012   Zoster Recombinant(Shingrix) 12/17/2016, 02/26/2017   Zoster, Live 10/28/2011    Health Maintenance  Topic Date Due   DTaP/Tdap/Td (3 - Tdap) 11/07/2024 (Originally 04/27/2022)   COVID-19 Vaccine (3 - Pfizer risk series) 11/23/2024 (Originally 07/18/2020)   Bone Density Scan  08/24/2025 (Originally 02/27/2010)   Medicare Annual Wellness (AWV)  05/05/2025   Pneumococcal Vaccine: 50+ Years  Completed   Influenza Vaccine  Completed   Hepatitis C Screening  Completed   Zoster Vaccines- Shingrix  Completed   Meningococcal B Vaccine  Aged Out   Mammogram  Discontinued    Discussed health benefits of physical activity, and encouraged her to engage in regular exercise appropriate for her age and condition.  Problem List Items Addressed This Visit       Cardiovascular and Mediastinum   HTN (hypertension)   Relevant Medications   lovastatin  (MEVACOR ) 40 MG tablet   propranolol  (INDERAL ) 40 MG tablet   Other Relevant Orders   CBC with Differential/Platelet   CMP14+EGFR     Endocrine   Postoperative hypothyroidism   Relevant Medications   levothyroxine  (SYNTHROID ) 112 MCG tablet   propranolol  (INDERAL ) 40 MG tablet   Other Relevant Orders   TSH     Other   History of thyroid  cancer   Relevant Medications   levothyroxine  (SYNTHROID ) 112 MCG tablet   Anxiety   Relevant Medications   buPROPion  (WELLBUTRIN  XL) 150 MG 24 hr tablet   hydrOXYzine  (ATARAX ) 10 MG tablet   citalopram  (CELEXA ) 40 MG tablet   Hyperlipidemia   Relevant Medications   lovastatin  (MEVACOR ) 40 MG tablet   propranolol  (INDERAL ) 40 MG tablet   Other Relevant Orders   Lipid panel   RESOLVED: Reactive depression    Relevant Medications   buPROPion  (WELLBUTRIN  XL) 150 MG 24 hr tablet   hydrOXYzine  (ATARAX ) 10 MG tablet   citalopram  (CELEXA ) 40 MG tablet   Other Visit Diagnoses       Routine general medical examination at a health care facility    -  Primary     Migraine without aura and without status migrainosus, not intractable       Relevant Medications   buPROPion  (WELLBUTRIN  XL) 150 MG 24 hr tablet   lovastatin  (MEVACOR ) 40 MG tablet   propranolol  (INDERAL ) 40 MG tablet   SUMAtriptan  (IMITREX ) 100 MG tablet   citalopram  (CELEXA ) 40 MG tablet     Encounter for immunization       Relevant Orders   Flu vaccine HIGH DOSE PF(Fluzone Trivalent) (Completed)      Return in about 6 months (around 02/22/2025) for chronic follow up.   The patient indicates understanding of these issues and agrees with the plan.  Annabella CHRISTELLA Search, FNP      [1]  Outpatient Medications Prior to Visit  Medication Sig   Calcium  Carbonate-Vit D-Min (CALCIUM  1200 PO) Take 1,000 mg by mouth daily.    citalopram  (CELEXA ) 40 MG tablet TAKE 1 TABLET BY MOUTH DAILY   famotidine  (PEPCID ) 20 MG tablet Take 20 mg by mouth daily.   fluticasone  (FLONASE ) 50 MCG/ACT nasal spray Place 2 sprays into both nostrils daily.   buPROPion  (WELLBUTRIN  XL) 150 MG 24 hr tablet  Take 1 tablet (150 mg total) by mouth daily.   hydrOXYzine  (ATARAX ) 10 MG tablet TAKE 1 TABLET BY MOUTH 3 TIMES  DAILY AS NEEDED   levothyroxine  (SYNTHROID ) 112 MCG tablet TAKE 1 TABLET BY MOUTH DAILY   lovastatin  (MEVACOR ) 40 MG tablet Take 1 tablet (40 mg total) by mouth at bedtime.   nitroGLYCERIN  (NITROSTAT ) 0.4 MG SL tablet Place 1 tablet (0.4 mg total) under the tongue every 5 (five) minutes as needed for chest pain.   propranolol  (INDERAL ) 40 MG tablet Take 1 tablet (40 mg total) by mouth daily.   SUMAtriptan  (IMITREX ) 100 MG tablet Take 1 tablet (100 mg total) by mouth every 2 (two) hours as needed for migraine. May repeat in 2 hours if headache persists  or recurs.   [DISCONTINUED] benzonatate  (TESSALON  PERLES) 100 MG capsule Take 1 capsule (100 mg total) by mouth 3 (three) times daily as needed.   No facility-administered medications prior to visit.

## 2024-08-25 ENCOUNTER — Ambulatory Visit: Payer: Self-pay | Admitting: Family Medicine

## 2024-08-25 DIAGNOSIS — E890 Postprocedural hypothyroidism: Secondary | ICD-10-CM

## 2024-08-25 LAB — CMP14+EGFR
ALT: 12 IU/L (ref 0–32)
AST: 17 IU/L (ref 0–40)
Albumin: 4 g/dL (ref 3.8–4.8)
Alkaline Phosphatase: 70 IU/L (ref 49–135)
BUN/Creatinine Ratio: 19 (ref 12–28)
BUN: 11 mg/dL (ref 8–27)
Bilirubin Total: 0.3 mg/dL (ref 0.0–1.2)
CO2: 21 mmol/L (ref 20–29)
Calcium: 9.2 mg/dL (ref 8.7–10.3)
Chloride: 104 mmol/L (ref 96–106)
Creatinine, Ser: 0.59 mg/dL (ref 0.57–1.00)
Globulin, Total: 2.3 g/dL (ref 1.5–4.5)
Glucose: 100 mg/dL — ABNORMAL HIGH (ref 70–99)
Potassium: 4.2 mmol/L (ref 3.5–5.2)
Sodium: 139 mmol/L (ref 134–144)
Total Protein: 6.3 g/dL (ref 6.0–8.5)
eGFR: 92 mL/min/1.73

## 2024-08-25 LAB — LIPID PANEL
Chol/HDL Ratio: 3.7 ratio (ref 0.0–4.4)
Cholesterol, Total: 180 mg/dL (ref 100–199)
HDL: 49 mg/dL
LDL Chol Calc (NIH): 113 mg/dL — ABNORMAL HIGH (ref 0–99)
Triglycerides: 97 mg/dL (ref 0–149)
VLDL Cholesterol Cal: 18 mg/dL (ref 5–40)

## 2024-08-25 LAB — CBC WITH DIFFERENTIAL/PLATELET
Basophils Absolute: 0.1 x10E3/uL (ref 0.0–0.2)
Basos: 1 %
EOS (ABSOLUTE): 0.2 x10E3/uL (ref 0.0–0.4)
Eos: 2 %
Hematocrit: 39.4 % (ref 34.0–46.6)
Hemoglobin: 12.5 g/dL (ref 11.1–15.9)
Immature Grans (Abs): 0 x10E3/uL (ref 0.0–0.1)
Immature Granulocytes: 0 %
Lymphocytes Absolute: 2.4 x10E3/uL (ref 0.7–3.1)
Lymphs: 35 %
MCH: 28.5 pg (ref 26.6–33.0)
MCHC: 31.7 g/dL (ref 31.5–35.7)
MCV: 90 fL (ref 79–97)
Monocytes Absolute: 0.5 x10E3/uL (ref 0.1–0.9)
Monocytes: 7 %
Neutrophils Absolute: 3.7 x10E3/uL (ref 1.4–7.0)
Neutrophils: 55 %
Platelets: 310 x10E3/uL (ref 150–450)
RBC: 4.38 x10E6/uL (ref 3.77–5.28)
RDW: 12.3 % (ref 11.7–15.4)
WBC: 6.8 x10E3/uL (ref 3.4–10.8)

## 2024-08-25 LAB — TSH: TSH: 0.149 u[IU]/mL — ABNORMAL LOW (ref 0.450–4.500)

## 2024-08-25 MED ORDER — LEVOTHYROXINE SODIUM 100 MCG PO TABS: 100.0000 ug | ORAL_TABLET | Freq: Every day | ORAL | 3 refills | Status: AC

## 2024-09-21 MED ORDER — LOVASTATIN 40 MG PO TABS: 80.0000 mg | ORAL_TABLET | Freq: Every day | ORAL | 3 refills | Status: AC

## 2024-10-04 ENCOUNTER — Ambulatory Visit: Payer: Medicare Other | Admitting: Family Medicine

## 2024-11-09 ENCOUNTER — Other Ambulatory Visit

## 2025-05-08 ENCOUNTER — Ambulatory Visit: Payer: Self-pay

## 2025-08-27 ENCOUNTER — Encounter: Admitting: Family Medicine
